# Patient Record
Sex: Female | Born: 1985
Health system: Southern US, Community
[De-identification: ages and names within clinical notes are randomized; demographics above are authoritative.]

## PROBLEM LIST (undated history)

## (undated) DIAGNOSIS — O1410 Severe pre-eclampsia, unspecified trimester: Secondary | ICD-10-CM

## (undated) DIAGNOSIS — N159 Renal tubulo-interstitial disease, unspecified: Secondary | ICD-10-CM

## (undated) DIAGNOSIS — O1420 HELLP syndrome (HELLP), unspecified trimester: Secondary | ICD-10-CM

## (undated) DIAGNOSIS — J45909 Unspecified asthma, uncomplicated: Secondary | ICD-10-CM

## (undated) DIAGNOSIS — T8859XA Other complications of anesthesia, initial encounter: Secondary | ICD-10-CM

## (undated) DIAGNOSIS — T7840XA Allergy, unspecified, initial encounter: Secondary | ICD-10-CM

## (undated) DIAGNOSIS — O904 Postpartum acute kidney failure: Secondary | ICD-10-CM

## (undated) DIAGNOSIS — F329 Major depressive disorder, single episode, unspecified: Secondary | ICD-10-CM

## (undated) DIAGNOSIS — O9049 Other postpartum acute kidney failure: Secondary | ICD-10-CM

## (undated) DIAGNOSIS — R112 Nausea with vomiting, unspecified: Secondary | ICD-10-CM

## (undated) DIAGNOSIS — F32A Depression, unspecified: Secondary | ICD-10-CM

## (undated) DIAGNOSIS — D649 Anemia, unspecified: Secondary | ICD-10-CM

## (undated) DIAGNOSIS — K7200 Acute and subacute hepatic failure without coma: Secondary | ICD-10-CM

## (undated) DIAGNOSIS — R569 Unspecified convulsions: Secondary | ICD-10-CM

## (undated) DIAGNOSIS — G40A09 Absence epileptic syndrome, not intractable, without status epilepticus: Secondary | ICD-10-CM

## (undated) DIAGNOSIS — Z9889 Other specified postprocedural states: Secondary | ICD-10-CM

## (undated) DIAGNOSIS — T4145XA Adverse effect of unspecified anesthetic, initial encounter: Secondary | ICD-10-CM

## (undated) HISTORY — DX: Anemia, unspecified: D64.9

## (undated) HISTORY — PX: TUBAL LIGATION: SHX77

## (undated) HISTORY — DX: Major depressive disorder, single episode, unspecified: F32.9

## (undated) HISTORY — DX: Depression, unspecified: F32.A

## (undated) HISTORY — DX: Allergy, unspecified, initial encounter: T78.40XA

## (undated) HISTORY — PX: BREAST ENHANCEMENT SURGERY: SHX7

---

## 2007-11-01 DIAGNOSIS — O1420 HELLP syndrome (HELLP), unspecified trimester: Secondary | ICD-10-CM

## 2007-11-01 HISTORY — DX: HELLP syndrome (HELLP), unspecified trimester: O14.20

## 2013-05-15 ENCOUNTER — Encounter (HOSPITAL_COMMUNITY): Payer: Self-pay

## 2013-05-15 ENCOUNTER — Emergency Department (HOSPITAL_COMMUNITY)
Admission: EM | Admit: 2013-05-15 | Discharge: 2013-05-16 | Disposition: A | Discharge status: Home / Self Care | Payer: BC Managed Care – PPO | Attending: Emergency Medicine | Admitting: Emergency Medicine

## 2013-05-15 DIAGNOSIS — Z87448 Personal history of other diseases of urinary system: Secondary | ICD-10-CM | POA: Insufficient documentation

## 2013-05-15 DIAGNOSIS — N12 Tubulo-interstitial nephritis, not specified as acute or chronic: Secondary | ICD-10-CM | POA: Insufficient documentation

## 2013-05-15 DIAGNOSIS — R51 Headache: Secondary | ICD-10-CM | POA: Insufficient documentation

## 2013-05-15 DIAGNOSIS — Z8719 Personal history of other diseases of the digestive system: Secondary | ICD-10-CM | POA: Insufficient documentation

## 2013-05-15 DIAGNOSIS — Z8669 Personal history of other diseases of the nervous system and sense organs: Secondary | ICD-10-CM | POA: Insufficient documentation

## 2013-05-15 DIAGNOSIS — N390 Urinary tract infection, site not specified: Secondary | ICD-10-CM | POA: Insufficient documentation

## 2013-05-15 DIAGNOSIS — M542 Cervicalgia: Secondary | ICD-10-CM | POA: Insufficient documentation

## 2013-05-15 DIAGNOSIS — Z87891 Personal history of nicotine dependence: Secondary | ICD-10-CM | POA: Insufficient documentation

## 2013-05-15 DIAGNOSIS — R109 Unspecified abdominal pain: Secondary | ICD-10-CM | POA: Insufficient documentation

## 2013-05-15 DIAGNOSIS — M549 Dorsalgia, unspecified: Secondary | ICD-10-CM | POA: Insufficient documentation

## 2013-05-15 HISTORY — DX: Acute and subacute hepatic failure without coma: K72.00

## 2013-05-15 HISTORY — DX: Absence epileptic syndrome, not intractable, without status epilepticus: G40.A09

## 2013-05-15 HISTORY — DX: Postpartum acute kidney failure: O90.4

## 2013-05-15 HISTORY — DX: Other postpartum acute kidney failure: O90.49

## 2013-05-15 HISTORY — DX: Renal tubulo-interstitial disease, unspecified: N15.9

## 2013-05-15 LAB — CBC WITH DIFFERENTIAL/PLATELET
Eosinophils Absolute: 0 10*3/uL (ref 0.0–0.7)
Eosinophils Relative: 0 % (ref 0–5)
HCT: 35.7 % — ABNORMAL LOW (ref 36.0–46.0)
Hemoglobin: 12.9 g/dL (ref 12.0–15.0)
Lymphocytes Relative: 7 % — ABNORMAL LOW (ref 12–46)
Lymphs Abs: 0.6 10*3/uL — ABNORMAL LOW (ref 0.7–4.0)
MCH: 31.4 pg (ref 26.0–34.0)
MCV: 86.9 fL (ref 78.0–100.0)
Monocytes Relative: 7 % (ref 3–12)
RBC: 4.11 MIL/uL (ref 3.87–5.11)
WBC: 9.5 10*3/uL (ref 4.0–10.5)

## 2013-05-15 LAB — URINALYSIS, ROUTINE W REFLEX MICROSCOPIC
Bilirubin Urine: NEGATIVE
Glucose, UA: NEGATIVE mg/dL
Hgb urine dipstick: NEGATIVE
Specific Gravity, Urine: 1.02 (ref 1.005–1.030)
pH: 7.5 (ref 5.0–8.0)

## 2013-05-15 LAB — URINE MICROSCOPIC-ADD ON

## 2013-05-15 LAB — BASIC METABOLIC PANEL
BUN: 6 mg/dL (ref 6–23)
CO2: 26 mEq/L (ref 19–32)
Calcium: 8.9 mg/dL (ref 8.4–10.5)
GFR calc non Af Amer: >90 mL/min (ref >90)
Glucose, Bld: 123 mg/dL — ABNORMAL HIGH (ref 70–99)
Sodium: 137 mEq/L (ref 135–145)

## 2013-05-15 MED ORDER — ONDANSETRON HCL 4 MG/2ML IJ SOLN
4.0000 mg | Freq: Once | INTRAMUSCULAR | Status: AC
  Administered 2013-05-15: 4 mg via INTRAVENOUS
  Filled 2013-05-15: qty 2

## 2013-05-15 MED ORDER — DEXTROSE 5 % IV SOLN
INTRAVENOUS | Status: AC
  Administered 2013-05-15: 23:00:00 via INTRAVENOUS
  Filled 2013-05-15: qty 10

## 2013-05-15 MED ORDER — SODIUM CHLORIDE 0.9 % IV BOLUS (SEPSIS)
1000.0000 mL | Freq: Once | INTRAVENOUS | Status: AC
  Administered 2013-05-15: 1000 mL via INTRAVENOUS

## 2013-05-15 MED ORDER — IBUPROFEN 800 MG PO TABS
800.0000 mg | ORAL_TABLET | Freq: Once | ORAL | Status: AC
  Administered 2013-05-15: 800 mg via ORAL
  Filled 2013-05-15: qty 1

## 2013-05-15 MED ORDER — HYDROMORPHONE HCL PF 1 MG/ML IJ SOLN
1.0000 mg | Freq: Once | INTRAMUSCULAR | Status: AC
  Administered 2013-05-15: 1 mg via INTRAVENOUS
  Filled 2013-05-15: qty 1

## 2013-05-15 MED ORDER — DEXTROSE 5 % IV SOLN
1.0000 g | Freq: Once | INTRAVENOUS | Status: AC
  Filled 2013-05-15 (×2): qty 10

## 2013-05-15 NOTE — ED Notes (Signed)
I have a kidney infection again, this is my second one in less than a year per pt. Pain in my left kidney spreading over to my right kidney. Pain in my spine. Chills and fever. Pain in my head per pt.

## 2013-05-15 NOTE — ED Provider Notes (Signed)
History    This chart was scribed for Alexis Lennert, MD by Donne Anon, ED Scribe. This patient was seen in room APA11/APA11 and the patient's care was started at 2133.  CSN: [id number] Arrival date & time 05/15/13  2107  First MD Initiated Contact with Patient 05/15/13 2133     Chief Complaint  Patient presents with  . Pyelonephritis  . Fever  . Headache    Patient is a 27 y.o. female presenting with fever and flank pain. The history is provided by the patient. No language interpreter was used.  Fever Max temp prior to arrival:  104 Temp source:  Oral Onset quality:  Gradual Progression:  Waxing and waning Chronicity:  New Relieved by:  Nothing Worsened by:  Nothing tried Ineffective treatments:  Acetaminophen and ibuprofen Associated symptoms: headaches   Associated symptoms: no chest pain, no congestion, no cough, no diarrhea, no dysuria and no rash   Flank Pain This is a new problem. The current episode started more than 2 days ago. The problem occurs constantly. The problem has been gradually worsening. Associated symptoms include headaches. Pertinent negatives include no chest pain and no abdominal pain. Nothing aggravates the symptoms. Nothing relieves the symptoms. She has tried acetaminophen for the symptoms. The treatment provided no relief.   HPI Comments: Alexis White is a 27 y.o. female who presents to the Emergency Department complaining of gradual onset, gradually worsening left sided kidney pain that began a few days ago and is beginning to spread to her right kidney. She reports associated fever (max PTA 104), chills, photophobia, neck pain and back pain. She has tried ibuprofen and Advil with little relief. This feels similar to her previous kidney infections. She denies a hx of bladder infections, but states this will be her second kidney infection this year.   Past Medical History  Diagnosis Date  . Kidney infection   . Acute liver failure   . Acute  kidney failure following labor and delivery   . Absence seizure    Past Surgical History  Procedure Laterality Date  . Breast enhancement surgery    . Cesarean section     No family history on file. History  Substance Use Topics  . Smoking status: Former Games developer  . Smokeless tobacco: Current User  . Alcohol Use: Yes     Comment: occasional   OB History   Grav Para Term Preterm Abortions TAB SAB Ect Mult Living                 Review of Systems  Constitutional: Positive for fever. Negative for appetite change and fatigue.  HENT: Positive for neck pain. Negative for congestion, sinus pressure and ear discharge.   Eyes: Negative for discharge.  Respiratory: Negative for cough.   Cardiovascular: Negative for chest pain.  Gastrointestinal: Negative for abdominal pain and diarrhea.  Genitourinary: Positive for flank pain. Negative for dysuria, frequency and hematuria.  Musculoskeletal: Positive for back pain.  Skin: Negative for rash.  Neurological: Positive for headaches. Negative for seizures.  Psychiatric/Behavioral: Negative for hallucinations.    Allergies  Ciprofloxacin hcl  Home Medications   Current Outpatient Rx  Name  Route  Sig  Dispense  Refill  . medroxyPROGESTERone (DEPO-PROVERA) 150 MG/ML injection   Intramuscular   Inject 150 mg into the muscle every 3 (three) months.         . sulfamethoxazole-trimethoprim (BACTRIM DS) 800-160 MG per tablet   Oral   Take 1 tablet by  mouth 2 (two) times daily. Started on 05/15/2013          BP 107/64  Pulse 128  Temp(Src) 102.9 F (39.4 C) (Oral)  Resp 24  Ht 5\' 3"  (1.6 m)  Wt 125 lb (56.7 kg)  BMI 22.15 kg/m2  SpO2 100% Physical Exam  Constitutional: She is oriented to person, place, and time. She appears well-developed.  HENT:  Head: Normocephalic.  Eyes: Conjunctivae and EOM are normal. No scleral icterus.  Neck: Neck supple. No thyromegaly present.  Cardiovascular: Normal rate and regular rhythm.  Exam  reveals no gallop and no friction rub.   No murmur heard. Pulmonary/Chest: No stridor. She has no wheezes. She has no rales. She exhibits no tenderness.  Abdominal: She exhibits no distension. There is no tenderness. There is no rebound.  Musculoskeletal: Normal range of motion. She exhibits no edema.  Left flank tenderness. Mild posterior neck tenderness.  Lymphadenopathy:    She has no cervical adenopathy.  Neurological: She is oriented to person, place, and time. Coordination normal.  Skin: No rash noted. No erythema.  Psychiatric: She has a normal mood and affect. Her behavior is normal.    ED Course  Procedures (including critical care time) DIAGNOSTIC STUDIES: Oxygen Saturation is 100% on RA, normal by my interpretation.    COORDINATION OF CARE: 9:41 PM Discussed treatment plan which includes ibuprofen and labs with pt at bedside and pt agreed to plan.   Results for orders placed during the hospital encounter of 05/15/13  URINALYSIS, ROUTINE W REFLEX MICROSCOPIC      Result Value Range   Color, Urine YELLOW  YELLOW   APPearance CLEAR  CLEAR   Specific Gravity, Urine 1.020  1.005 - 1.030   pH 7.5  5.0 - 8.0   Glucose, UA NEGATIVE  NEGATIVE mg/dL   Hgb urine dipstick NEGATIVE  NEGATIVE   Bilirubin Urine NEGATIVE  NEGATIVE   Ketones, ur NEGATIVE  NEGATIVE mg/dL   Protein, ur 30 (*) NEGATIVE mg/dL   Urobilinogen, UA 1.0  0.0 - 1.0 mg/dL   Nitrite NEGATIVE  NEGATIVE   Leukocytes, UA NEGATIVE  NEGATIVE  CBC WITH DIFFERENTIAL      Result Value Range   WBC 9.5  4.0 - 10.5 K/uL   RBC 4.11  3.87 - 5.11 MIL/uL   Hemoglobin 12.9  12.0 - 15.0 g/dL   HCT 16.1 (*) 09.6 - 04.5 %   MCV 86.9  78.0 - 100.0 fL   MCH 31.4  26.0 - 34.0 pg   MCHC 36.1 (*) 30.0 - 36.0 g/dL   RDW 40.9  81.1 - 91.4 %   Platelets 168  150 - 400 K/uL   Neutrophils Relative % 87 (*) 43 - 77 %   Neutro Abs 8.2 (*) 1.7 - 7.7 K/uL   Lymphocytes Relative 7 (*) 12 - 46 %   Lymphs Abs 0.6 (*) 0.7 - 4.0 K/uL    Monocytes Relative 7  3 - 12 %   Monocytes Absolute 0.6  0.1 - 1.0 K/uL   Eosinophils Relative 0  0 - 5 %   Eosinophils Absolute 0.0  0.0 - 0.7 K/uL   Basophils Relative 0  0 - 1 %   Basophils Absolute 0.0  0.0 - 0.1 K/uL  BASIC METABOLIC PANEL      Result Value Range   Sodium 137  135 - 145 mEq/L   Potassium 3.3 (*) 3.5 - 5.1 mEq/L   Chloride 101  96 - 112 mEq/L  CO2 26  19 - 32 mEq/L   Glucose, Bld 123 (*) 70 - 99 mg/dL   BUN 6  6 - 23 mg/dL   Creatinine, Ser 4.09  0.50 - 1.10 mg/dL   Calcium 8.9  8.4 - 81.1 mg/dL   GFR calc non Af Amer >90  >90 mL/min   GFR calc Af Amer >90  >90 mL/min  URINE MICROSCOPIC-ADD ON      Result Value Range   Squamous Epithelial / LPF RARE  RARE   WBC, UA 21-50  <3 WBC/hpf   Bacteria, UA FEW (*) RARE   No results found.    No diagnosis found.  MDM    The chart was scribed for me under my direct supervision.  I personally performed the history, physical, and medical decision making and all procedures in the evaluation of this patient.Alexis Lennert, MD 05/15/13 2312

## 2013-05-15 NOTE — ED Notes (Addendum)
IV fluids not running at this time. Placed IV fluids on pump to run over 1 hour. Patient immediately started complaining of a warm sensation and a funny feeling in her head. Spouse states that it does not look like the fluids had ran any and was asking if there was a chance that the dilaudid had not went completely through her IV. Patient stated feeling was subsiding and ask me to run fluids at a slower rate. Cut rate back to half at this time.

## 2013-05-16 NOTE — ED Notes (Signed)
Around 900 ML of NS left in bag at this time. Changed rate to bolus over 1 hour on pump. Patient and spouse state that they want to stay until fluid has finished running in.

## 2013-05-17 LAB — URINE CULTURE: Colony Count: NO GROWTH

## 2015-05-13 ENCOUNTER — Encounter: Payer: Self-pay | Admitting: *Deleted

## 2015-05-14 ENCOUNTER — Encounter: Payer: Self-pay | Admitting: Advanced Practice Midwife

## 2015-05-14 ENCOUNTER — Ambulatory Visit (INDEPENDENT_AMBULATORY_CARE_PROVIDER_SITE_OTHER): Payer: BLUE CROSS/BLUE SHIELD | Admitting: Advanced Practice Midwife

## 2015-05-14 VITALS — BP 92/60 | HR 84 | Ht 63.5 in | Wt 131.0 lb

## 2015-05-14 DIAGNOSIS — Z3042 Encounter for surveillance of injectable contraceptive: Secondary | ICD-10-CM

## 2015-05-14 DIAGNOSIS — Z3202 Encounter for pregnancy test, result negative: Secondary | ICD-10-CM

## 2015-05-14 LAB — POCT URINE PREGNANCY: PREG TEST UR: NEGATIVE

## 2015-05-14 MED ORDER — MEDROXYPROGESTERONE ACETATE 150 MG/ML IM SUSP
150.0000 mg | Freq: Once | INTRAMUSCULAR | Status: AC
  Administered 2015-05-14: 150 mg via INTRAMUSCULAR

## 2015-05-14 NOTE — Progress Notes (Signed)
   [facility] Visit  Patient name: Alexis White MRN [id number]  Date of birth: 1986-05-25  CC & HPI:  Alexis White is a 29 y.o. Caucasian female presenting today for maintenance of depo.  She has been on it for a few years, aware that it could decrease bone, and takes a calcuim supplement.  Had a B salpingectomy, but uses depo for ovarian cyst suppression  Is amenorrheic and happy.  Tired of driving to [place] for depo.  Had normal pap 9/14.   Pertinent History Reviewed:  Medical & Surgical Hx:   Past Medical History  Diagnosis Date  . Kidney infection   . Acute liver failure   . Acute kidney failure following labor and delivery   . Absence seizure    Past Surgical History  Procedure Laterality Date  . Breast enhancement surgery    . Cesarean section    . Tubal ligation     Family History  Problem Relation Age of Onset  . Heart disease Father   . Cancer Maternal Aunt     skin  . Cancer Paternal Grandfather     bladder  . Diabetes Other   . Heart disease Other   . Heart disease Maternal Grandmother   . Congestive Heart Failure Maternal Grandmother   . Cancer Maternal Aunt     skin    Current outpatient prescriptions:  .  medroxyPROGESTERone (DEPO-PROVERA) 150 MG/ML injection, Inject 150 mg into the muscle every 3 (three) months., Disp: , Rfl:  Social History: Reviewed -  reports that she has been smoking E-cigarettes.  She has never used smokeless tobacco.  Review of Systems:   Constitutional: Negative for fever and chills Eyes: Negative for visual disturbances Respiratory: Negative for shortness of breath, dyspnea Cardiovascular: Negative for chest pain or palpitations  Gastrointestinal: Negative for vomiting, diarrhea and constipation; no abdominal pain Genitourinary: Negative for dysuria and urgency, vaginal irritation or itching Musculoskeletal: Negative for back pain, joint pain, myalgias  Neurological: Negative for dizziness and  headaches    Objective Findings:  Vitals: BP 92/60 mmHg  Pulse 84  Ht 5' 3.5" (1.613 m)  Wt 131 lb (59.421 kg)  BMI 22.84 kg/m2  Physical Examination: General appearance - alert, well appearing, and in no distress Mental status - alert, oriented to person, place, and time Chest - clear to auscultation, no wheezes, rales or rhonchi, symmetric air entry Heart - normal rate and regular rhythm Abdomen - soft, nontender, nondistended, no masses or organomegaly bowel sounds normal Musculoskeletal - no muscular tenderness noted   Results for orders placed or performed in visit on 05/14/15 (from the past 24 hour(s))  POCT urine pregnancy   Collection Time: 05/14/15 11:53 AM  Result Value Ref Range   Preg Test, Ur Negative Negative         Assessment & Plan:  A:  Contraception management P:  Depo today   F/U 12 weeks for depo   Pap 9/17 CRESENZO-DISHMAN,Colson Barco CNM 05/14/2015 11:59 AM

## 2015-08-04 ENCOUNTER — Encounter: Payer: Self-pay | Admitting: *Deleted

## 2015-08-04 ENCOUNTER — Ambulatory Visit (INDEPENDENT_AMBULATORY_CARE_PROVIDER_SITE_OTHER): Payer: BLUE CROSS/BLUE SHIELD | Admitting: *Deleted

## 2015-08-04 DIAGNOSIS — Z3202 Encounter for pregnancy test, result negative: Secondary | ICD-10-CM

## 2015-08-04 DIAGNOSIS — Z3042 Encounter for surveillance of injectable contraceptive: Secondary | ICD-10-CM | POA: Diagnosis not present

## 2015-08-04 LAB — POCT URINE PREGNANCY: PREG TEST UR: NEGATIVE

## 2015-08-04 MED ORDER — MEDROXYPROGESTERONE ACETATE 150 MG/ML IM SUSP
150.0000 mg | Freq: Once | INTRAMUSCULAR | Status: AC
  Administered 2015-08-04: 150 mg via INTRAMUSCULAR

## 2015-08-04 NOTE — Progress Notes (Signed)
Pt here for Depo. Reports no problems at this time. Return in 12 weeks for next shot. JSY 

## 2015-10-20 ENCOUNTER — Ambulatory Visit (INDEPENDENT_AMBULATORY_CARE_PROVIDER_SITE_OTHER): Payer: BLUE CROSS/BLUE SHIELD | Admitting: *Deleted

## 2015-10-20 ENCOUNTER — Encounter: Payer: Self-pay | Admitting: *Deleted

## 2015-10-20 DIAGNOSIS — Z3042 Encounter for surveillance of injectable contraceptive: Secondary | ICD-10-CM | POA: Diagnosis not present

## 2015-10-20 DIAGNOSIS — Z3202 Encounter for pregnancy test, result negative: Secondary | ICD-10-CM

## 2015-10-20 DIAGNOSIS — Z3200 Encounter for pregnancy test, result unknown: Secondary | ICD-10-CM

## 2015-10-20 LAB — POCT URINE PREGNANCY: PREG TEST UR: NEGATIVE

## 2015-10-20 MED ORDER — MEDROXYPROGESTERONE ACETATE 150 MG/ML IM SUSP
150.0000 mg | Freq: Once | INTRAMUSCULAR | Status: AC
  Administered 2015-10-20: 150 mg via INTRAMUSCULAR

## 2015-10-20 NOTE — Progress Notes (Signed)
Patient ID: Alexis OrganDevin L White, female   DOB: 09/15/1986, 29 y.o.   MRN: [id number] Pt here today for DEPO injection. Pt denies any problems or concerns at this time. UPT today is negative.

## 2015-12-25 ENCOUNTER — Telehealth: Payer: Self-pay | Admitting: Adult Health

## 2015-12-25 NOTE — Telephone Encounter (Signed)
I called Depo into [facility] in [place]. Pt aware. JSY

## 2016-01-05 ENCOUNTER — Encounter: Payer: Self-pay | Admitting: *Deleted

## 2016-01-05 ENCOUNTER — Ambulatory Visit (INDEPENDENT_AMBULATORY_CARE_PROVIDER_SITE_OTHER): Payer: 59 | Admitting: *Deleted

## 2016-01-05 DIAGNOSIS — Z3042 Encounter for surveillance of injectable contraceptive: Secondary | ICD-10-CM | POA: Diagnosis not present

## 2016-01-05 DIAGNOSIS — Z3202 Encounter for pregnancy test, result negative: Secondary | ICD-10-CM

## 2016-01-05 LAB — POCT URINE PREGNANCY: Preg Test, Ur: NEGATIVE

## 2016-01-05 MED ORDER — MEDROXYPROGESTERONE ACETATE 150 MG/ML IM SUSP
150.0000 mg | Freq: Once | INTRAMUSCULAR | Status: AC
  Administered 2016-01-05: 150 mg via INTRAMUSCULAR

## 2016-01-05 NOTE — Progress Notes (Signed)
Pt here for Depo. Pt tolerated shot well. Return in 12 weeks for next shot. JSY 

## 2016-03-22 ENCOUNTER — Ambulatory Visit: Payer: 59

## 2016-03-24 ENCOUNTER — Ambulatory Visit (INDEPENDENT_AMBULATORY_CARE_PROVIDER_SITE_OTHER): Payer: 59 | Admitting: *Deleted

## 2016-03-24 DIAGNOSIS — Z3042 Encounter for surveillance of injectable contraceptive: Secondary | ICD-10-CM | POA: Diagnosis not present

## 2016-03-24 DIAGNOSIS — Z3202 Encounter for pregnancy test, result negative: Secondary | ICD-10-CM | POA: Diagnosis not present

## 2016-03-24 LAB — POCT URINE PREGNANCY: Preg Test, Ur: NEGATIVE

## 2016-03-24 MED ORDER — MEDROXYPROGESTERONE ACETATE 150 MG/ML IM SUSP
150.0000 mg | Freq: Once | INTRAMUSCULAR | Status: AC
  Administered 2016-03-24: 150 mg via INTRAMUSCULAR

## 2016-03-24 NOTE — Progress Notes (Signed)
Pt here for Depo Provera injection, pt reports no problems at this time and tolerated injection well.  Pt advised to RTO in 12 wks for next injection.

## 2016-06-14 ENCOUNTER — Ambulatory Visit: Payer: 59 | Admitting: Obstetrics & Gynecology

## 2016-06-15 ENCOUNTER — Encounter: Payer: Self-pay | Admitting: Obstetrics & Gynecology

## 2016-06-15 ENCOUNTER — Other Ambulatory Visit (HOSPITAL_COMMUNITY)
Admission: RE | Admit: 2016-06-15 | Discharge: 2016-06-15 | Disposition: A | Discharge status: Home / Self Care | Payer: 59 | Source: Ambulatory Visit | Attending: Obstetrics & Gynecology | Admitting: Obstetrics & Gynecology

## 2016-06-15 ENCOUNTER — Ambulatory Visit (INDEPENDENT_AMBULATORY_CARE_PROVIDER_SITE_OTHER): Payer: 59 | Admitting: Obstetrics & Gynecology

## 2016-06-15 VITALS — BP 100/60 | HR 92 | Ht 63.4 in | Wt 140.0 lb

## 2016-06-15 DIAGNOSIS — Z3202 Encounter for pregnancy test, result negative: Secondary | ICD-10-CM | POA: Diagnosis not present

## 2016-06-15 DIAGNOSIS — Z01419 Encounter for gynecological examination (general) (routine) without abnormal findings: Secondary | ICD-10-CM | POA: Insufficient documentation

## 2016-06-15 DIAGNOSIS — N941 Unspecified dyspareunia: Secondary | ICD-10-CM | POA: Diagnosis not present

## 2016-06-15 LAB — POCT URINE PREGNANCY: PREG TEST UR: NEGATIVE

## 2016-06-15 NOTE — Progress Notes (Signed)
Subjective:     Alexis White is a 30 y.o. female here for a routine exam.  No LMP recorded. Patient has had an injection. [id number] Birth Control Method:  Tubal ligation salpingectomy Menstrual Calendar(currently): amneorrheic on depo for cycle control and fibrocystic breast changes  Current complaints: new onset dyspareunia.   Current acute medical issues:  none   Recent Gynecologic History No LMP recorded. Patient has had an injection. Last Pap: 2014,  normal Last mammogram: ,    Past Medical History:  Diagnosis Date  . Absence seizure (HCC)   . Acute kidney failure following labor and delivery   . Acute liver failure   . Kidney infection     Past Surgical History:  Procedure Laterality Date  . BREAST ENHANCEMENT SURGERY    . CESAREAN SECTION    . TUBAL LIGATION      OB History    Gravida Para Term Preterm AB Living   4       1 3    SAB TAB Ectopic Multiple Live Births   1       3      Social History   Social History  . Marital status: Married    Spouse name: N/A  . Number of children: N/A  . Years of education: N/A   Social History Main Topics  . Smoking status: Current Every Day Smoker    Types: E-cigarettes  . Smokeless tobacco: Never Used  . Alcohol use Yes     Comment: occasional wine cooler  . Drug use: No  . Sexual activity: Yes    Birth control/ protection: Injection, Surgical     Comment: tubal   Other Topics Concern  . None   Social History Narrative  . None    Family History  Problem Relation Age of Onset  . Heart disease Father   . Cancer Maternal Aunt     skin  . Breast cancer Maternal Aunt   . Cancer Paternal Grandfather     bladder  . Heart disease Maternal Grandmother   . Congestive Heart Failure Maternal Grandmother   . Cancer Maternal Aunt     skin  . Breast cancer Maternal Aunt   . Nevi Mother   . Diabetes Other   . Heart disease Other      Current Outpatient Prescriptions:  .  medroxyPROGESTERone (DEPO-PROVERA)  150 MG/ML injection, Inject 150 mg into the muscle every 3 (three) months., Disp: , Rfl:   Review of Systems  Review of Systems  Constitutional: Negative for fever, chills, weight loss, malaise/fatigue and diaphoresis.  HENT: Negative for hearing loss, ear pain, nosebleeds, congestion, sore throat, neck pain, tinnitus and ear discharge.   Eyes: Negative for blurred vision, double vision, photophobia, pain, discharge and redness.  Respiratory: Negative for cough, hemoptysis, sputum production, shortness of breath, wheezing and stridor.   Cardiovascular: Negative for chest pain, palpitations, orthopnea, claudication, leg swelling and PND.  Gastrointestinal: negative for abdominal pain. Negative for heartburn, nausea, vomiting, diarrhea, constipation, blood in stool and melena.  Genitourinary: Negative for dysuria, urgency, frequency, hematuria and flank pain.  Musculoskeletal: Negative for myalgias, back pain, joint pain and falls.  Skin: Negative for itching and rash.  Neurological: Negative for dizziness, tingling, tremors, sensory change, speech change, focal weakness, seizures, loss of consciousness, weakness and headaches.  Endo/Heme/Allergies: Negative for environmental allergies and polydipsia. Does not bruise/bleed easily.  Psychiatric/Behavioral: Negative for depression, suicidal ideas, hallucinations, memory loss and substance abuse. The patient is not nervous/anxious  and does not have insomnia.        Objective:  Blood pressure 100/60, pulse 92, height 5' 3.4" (1.61 m), weight 140 lb (63.5 kg).   Physical Exam  Vitals reviewed. Constitutional: She is oriented to person, place, and time. She appears well-developed and well-nourished.  HENT:  Head: Normocephalic and atraumatic.        Right Ear: External ear normal.  Left Ear: External ear normal.  Nose: Nose normal.  Mouth/Throat: Oropharynx is clear and moist.  Eyes: Conjunctivae and EOM are normal. Pupils are equal, round,  and reactive to light. Right eye exhibits no discharge. Left eye exhibits no discharge. No scleral icterus.  Neck: Normal range of motion. Neck supple. No tracheal deviation present. No thyromegaly present.  Cardiovascular: Normal rate, regular rhythm, normal heart sounds and intact distal pulses.  Exam reveals no gallop and no friction rub.   No murmur heard. Respiratory: Effort normal and breath sounds normal. No respiratory distress. She has no wheezes. She has no rales. She exhibits no tenderness.  GI: Soft. Bowel sounds are normal. She exhibits no distension and no mass. There is no tenderness. There is no rebound and no guarding.  Genitourinary:  Breasts no masses skin changes or nipple changes bilaterally      Vulva is normal without lesions Vagina is pink moist without discharge Cervix normal in appearance and pap is done Uterus is normal size shape and contour Adnexa is negative with normal sized ovaries   Musculoskeletal: Normal range of motion. She exhibits no edema and no tenderness.  Neurological: She is alert and oriented to person, place, and time. She has normal reflexes. She displays normal reflexes. No cranial nerve deficit. She exhibits normal muscle tone. Coordination normal.  Skin: Skin is warm and dry. No rash noted. No erythema. No pallor.  Psychiatric: She has a normal mood and affect. Her behavior is normal. Judgment and thought content normal.       Medications Ordered at today's visit: No orders of the defined types were placed in this encounter.   Other orders placed at today's visit: Orders Placed This Encounter  Procedures  . POCT urine pregnancy      Assessment:    Healthy female exam.    Plan:    Contraception: tubal ligation. Follow up in: 1 month.     Return in about 1 month (around 07/16/2016) for Follow up, with Dr Despina HiddenEure.

## 2016-06-17 LAB — CYTOLOGY - PAP

## 2016-08-04 ENCOUNTER — Encounter: Payer: Self-pay | Admitting: Obstetrics & Gynecology

## 2016-08-04 ENCOUNTER — Ambulatory Visit (INDEPENDENT_AMBULATORY_CARE_PROVIDER_SITE_OTHER): Payer: 59 | Admitting: Obstetrics & Gynecology

## 2016-08-04 VITALS — BP 100/60 | HR 80 | Ht 63.4 in | Wt 152.0 lb

## 2016-08-04 DIAGNOSIS — N941 Unspecified dyspareunia: Secondary | ICD-10-CM | POA: Diagnosis not present

## 2016-08-04 DIAGNOSIS — N946 Dysmenorrhea, unspecified: Secondary | ICD-10-CM

## 2016-08-04 DIAGNOSIS — N920 Excessive and frequent menstruation with regular cycle: Secondary | ICD-10-CM

## 2016-08-04 NOTE — Progress Notes (Signed)
Preoperative History and Physical  Alexis White is a 30 y.o. [phone] with No LMP recorded. Patient has had an injection. admitted for a abdominal hysterectomy, removal of tubes.  Pt has a lifelong history of menorrhagia and dysmenorrhea which essentially debilitates patient for several days at the time each month.  She is managed with depo provera even though she has had a tubal ligation, however she is started to be concerned about long term side effects of depo provera such as osteoporosis and mood disruption and she cannot really go about regular life otherwise. Additionally she has developed dyspareunia, bump, every time she ahs intercourse.  Exam reveals that to be an adherent uterus to her abdomian wall from her previous Caesarean sections.  She does not have lateral pain with menses or intercourse  PMH:    Past Medical History:  Diagnosis Date  . Absence seizure (HCC)   . Acute kidney failure following labor and delivery   . Acute liver failure   . Kidney infection     PSH:     Past Surgical History:  Procedure Laterality Date  . BREAST ENHANCEMENT SURGERY    . CESAREAN SECTION    . TUBAL LIGATION      POb/GynH:      OB History    Gravida Para Term Preterm AB Living   4       1 3    SAB TAB Ectopic Multiple Live Births   1       3      SH:   Social History  Substance Use Topics  . Smoking status: Current Every Day Smoker    Types: E-cigarettes  . Smokeless tobacco: Never Used  . Alcohol use Yes     Comment: occasional wine cooler    FH:    Family History  Problem Relation Age of Onset  . Heart disease Father   . Cancer Maternal Aunt     skin  . Breast cancer Maternal Aunt   . Cancer Paternal Grandfather     bladder  . Heart disease Maternal Grandmother   . Congestive Heart Failure Maternal Grandmother   . Cancer Maternal Aunt     skin  . Breast cancer Maternal Aunt   . Nevi Mother   . Diabetes Other   . Heart disease Other      Allergies:   Allergies  Allergen Reactions  . Ciprofloxacin Hcl   . Coppertone Kids Spf15 [Albolene] Hives  . Pollen Extract Hives    Medications:       Current Outpatient Prescriptions:  .  medroxyPROGESTERone (DEPO-PROVERA) 150 MG/ML injection, Inject 150 mg into the muscle every 3 (three) months., Disp: , Rfl:   Review of Systems:   Review of Systems  Constitutional: Negative for fever, chills, weight loss, malaise/fatigue and diaphoresis.  HENT: Negative for hearing loss, ear pain, nosebleeds, congestion, sore throat, neck pain, tinnitus and ear discharge.   Eyes: Negative for blurred vision, double vision, photophobia, pain, discharge and redness.  Respiratory: Negative for cough, hemoptysis, sputum production, shortness of breath, wheezing and stridor.   Cardiovascular: Negative for chest pain, palpitations, orthopnea, claudication, leg swelling and PND.  Gastrointestinal: Positive for abdominal pain. Negative for heartburn, nausea, vomiting, diarrhea, constipation, blood in stool and melena.  Genitourinary: Negative for dysuria, urgency, frequency, hematuria and flank pain.  Musculoskeletal: Negative for myalgias, back pain, joint pain and falls.  Skin: Negative for itching and rash.  Neurological: Negative for dizziness, tingling, tremors, sensory change, speech  change, focal weakness, seizures, loss of consciousness, weakness and headaches.  Endo/Heme/Allergies: Negative for environmental allergies and polydipsia. Does not bruise/bleed easily.  Psychiatric/Behavioral: Negative for depression, suicidal ideas, hallucinations, memory loss and substance abuse. The patient is not nervous/anxious and does not have insomnia.      PHYSICAL EXAM:  Blood pressure 100/60, pulse 80, height 5' 3.4" (1.61 m), weight 152 lb (68.9 kg).    Vitals reviewed. Constitutional: She is oriented to person, place, and time. She appears well-developed and well-nourished.  HENT:  Head: Normocephalic and  atraumatic.  Right Ear: External ear normal.  Left Ear: External ear normal.  Nose: Nose normal.  Mouth/Throat: Oropharynx is clear and moist.  Eyes: Conjunctivae and EOM are normal. Pupils are equal, round, and reactive to light. Right eye exhibits no discharge. Left eye exhibits no discharge. No scleral icterus.  Neck: Normal range of motion. Neck supple. No tracheal deviation present. No thyromegaly present.  Cardiovascular: Normal rate, regular rhythm, normal heart sounds and intact distal pulses.  Exam reveals no gallop and no friction rub.   No murmur heard. Respiratory: Effort normal and breath sounds normal. No respiratory distress. She has no wheezes. She has no rales. She exhibits no tenderness.  GI: Soft. Bowel sounds are normal. She exhibits no distension and no mass. There is tenderness. There is no rebound and no guarding.  Genitourinary:       Vulva is normal without lesions Vagina is pink moist without discharge Cervix normal in appearance and pap is normal, tender to movement Uterus is tender adherent to the anterior abdominal wall by adhesions from her 2 c sections Adnexa is negative with normal sized ovaries by sonogram  Musculoskeletal: Normal range of motion. She exhibits no edema and no tenderness.  Neurological: She is alert and oriented to person, place, and time. She has normal reflexes. She displays normal reflexes. No cranial nerve deficit. She exhibits normal muscle tone. Coordination normal.  Skin: Skin is warm and dry. No rash noted. No erythema. No pallor.  Psychiatric: She has a normal mood and affect. Her behavior is normal. Judgment and thought content normal.    Labs: No results found for this or any previous visit (from the past 336 hour(s)).  EKG: No orders found for this or any previous visit.  Imaging Studies: No results found.    Assessment: Dyspareunia, female  Dysmenorrhea  Menorrhagia with regular cycle     Plan: Abdominal  hysterectomy remove any remaining tubal tissue, plan to preserve ovaries unless otherwise indicated intra operatively  Coley Kulikowski H 08/04/2016 10:52 AM

## 2016-08-23 NOTE — Patient Instructions (Signed)
Alexis White  08/23/2016     @PREFPERIOPPHARMACY @   Your procedure is scheduled on  08/31/2016   Report to [facility] at  615 A.M.  Call this number if you have problems the morning of surgery:  [phone]   Remember:  Do not eat food or drink liquids after midnight.  Take these medicines the morning of surgery with A SIP OF WATER :none. Take your inhaler before you come.   Do not wear jewelry, make-up or nail polish.  Do not wear lotions, powders, or perfumes, or deoderant.  Do not shave 48 hours prior to surgery.  Men may shave face and neck.  Do not bring valuables to the hospital.  [facility] is not responsible for any belongings or valuables.  Contacts, dentures or bridgework may not be worn into surgery.  Leave your suitcase in the car.  After surgery it may be brought to your room.  For patients admitted to the hospital, discharge time will be determined by your treatment team.  Patients discharged the day of surgery will not be allowed to drive home.   Name and phone number of your driver:   family Special instructions:  none  Please read over the following fact sheets that you were given. Anesthesia Post-op Instructions and Care and Recovery After Surgery       Abdominal Hysterectomy Abdominal hysterectomy is a surgery to remove your womb (uterus). Your womb is the part of your body that contains a growing baby. The surgery may be done for many reasons. These may include cancer, growths (tumors), long-term pain, or bleeding. You may also need other reproductive parts removed during this surgery. This will depend on why you need to have the surgery. BEFORE THE PROCEDURE  Talk to your doctor about the changes to your body. These changes may be physical and emotional.  You may need to have blood work done. You may also need X-rays done.  Quit smoking if you smoke. Ask your doctor for help.  Stop taking medicines that thin your blood as  told by your doctor.  Your doctor may have you take other medicines. Take all medicines as told by your doctor.  Do not eat or drink anything for 6-8 hours before surgery.  Take your normal medicines with a small sip of water.  Shower or take a bath the night or morning before surgery. PROCEDURE  This surgery is done in the hospital.  You are given a medicine that makes you go to sleep (general anesthetic).  The doctor will make a cut (incision) through the skin in your lower belly.  The cut may be about 5-7 inches long. It may go side-to-side or up-and-down.  The doctor will move the body tissue that covers your womb. The doctor will carefully remove your womb. The doctor may remove any other reproductive parts that need to be removed.  The doctor will use clamps or stitches (sutures) to control bleeding.  The doctor will close your cut with stitches or metal clips. AFTER THE PROCEDURE  You will have pain right after the procedure.  You will be given pain medicine in the recovery room.  You will be taken to your hospital room after the medicines that made you go to sleep wear off.  You will be told how to take care of yourself at home.   This information is not intended to replace advice given to you  by your health care provider. Make sure you discuss any questions you have with your health care provider.   Document Released: 10/22/2013 Document Reviewed: 10/22/2013 Elsevier Interactive Patient Education 2016 [employer]  Abdominal Hysterectomy, Care After These instructions give you information on caring for yourself after your procedure. Your doctor may also give you more specific instructions. Call your doctor if you have any problems or questions after your procedure.  HOME CARE It takes 4-6 weeks to recover from this surgery. Follow all of your doctor's instructions.   Only take medicines as told by your doctor.  Change your bandage as told by your  doctor.  Return to your doctor to have your stitches taken out.  Take showers for 2-3 weeks. Ask your doctor when it is okay to shower.  Do not douche, use tampons, or have sex (intercourse) for at least 6 weeks or as told.  Follow your doctor's advice about exercise, lifting objects, driving, and general activities.  Get plenty of rest and sleep.  Do not lift anything heavier than a gallon of milk (about 10 pounds [4.5 kilograms]) for the first month after surgery.  Get back to your normal diet as told by your doctor.  Do not drink alcohol until your doctor says it is okay.  Take a medicine to help you poop (laxative) as told by your doctor.  Eating foods high in fiber may help you poop. Eat a lot of raw fruits and vegetables, whole grains, and beans.  Drink enough fluids to keep your pee (urine) clear or pale yellow.  Have someone help you at home for 1-2 weeks after your surgery.  Keep follow-up doctor visits as told. GET HELP IF:  You have chills or fever.  You have puffiness, redness, or pain in area of the cut (incision).  You have yellowish-white fluid (pus) coming from the cut.  You have a bad smell coming from the cut or bandage.  Your cut pulls apart.  You feel dizzy or light-headed.  You have pain or bleeding when you pee.  You keep having watery poop (diarrhea).  You keep feeling sick to your stomach (nauseous) or keep throwing up (vomiting).  You have fluid (discharge) coming from your vagina.  You have a rash.  You have a reaction to your medicine.  You need stronger pain medicine. GET HELP RIGHT AWAY IF:   You have a fever and your symptoms suddenly get worse.  You have bad belly (abdominal) pain.  You have chest pain.  You are short of breath.  You pass out (faint).  You have pain, puffiness, or redness of your leg.  You bleed a lot from your vagina and notice clumps of tissue (clots). MAKE SURE YOU:   Understand these  instructions.  Will watch your condition.  Will get help right away if you are not doing well or get worse.   This information is not intended to replace advice given to you by your health care provider. Make sure you discuss any questions you have with your health care provider.   Document Released: 07/26/2008 Document Revised: 10/22/2013 Document Reviewed: 08/09/2013 Elsevier Interactive Patient Education 2016 [employer] General Anesthesia, Adult General anesthesia is a sleep-like state of non-feeling produced by medicines (anesthetics). General anesthesia prevents you from being alert and feeling pain during a medical procedure. Your caregiver may recommend general anesthesia if your procedure:  Is long.  Is painful or uncomfortable.  Would be frightening to see or hear.  Requires  you to be still.  Affects your breathing.  Causes significant blood loss. LET YOUR CAREGIVER KNOW ABOUT:  Allergies to food or medicine.  Medicines taken, including vitamins, herbs, eyedrops, over-the-counter medicines, and creams.  Use of steroids (by mouth or creams).  Previous problems with anesthetics or numbing medicines, including problems experienced by relatives.  History of bleeding problems or blood clots.  Previous surgeries and types of anesthetics received.  Possibility of pregnancy, if this applies.  Use of cigarettes, alcohol, or illegal drugs.  Any health condition(s), especially diabetes, sleep apnea, and high blood pressure. RISKS AND COMPLICATIONS General anesthesia rarely causes complications. However, if complications do occur, they can be life threatening. Complications include:  A lung infection.  A stroke.  A heart attack.  Waking up during the procedure. When this occurs, the patient may be unable to move and communicate that he or she is awake. The patient may feel severe pain. Older adults and adults with serious medical problems are more likely to have  complications than adults who are young and healthy. Some complications can be prevented by answering all of your caregiver's questions thoroughly and by following all pre-procedure instructions. It is important to tell your caregiver if any of the pre-procedure instructions, especially those related to diet, were not followed. Any food or liquid in the stomach can cause problems when you are under general anesthesia. BEFORE THE PROCEDURE  Ask your caregiver if you will have to spend the night at the hospital. If you will not have to spend the night, arrange to have an adult drive you and stay with you for 24 hours.  Follow your caregiver's instructions if you are taking dietary supplements or medicines. Your caregiver may tell you to stop taking them or to reduce your dosage.  Do not smoke for as long as possible before your procedure. If possible, stop smoking 3-6 weeks before the procedure.  Do not take new dietary supplements or medicines within 1 week of your procedure unless your caregiver approves them.  Do not eat within 8 hours of your procedure or as directed by your caregiver. Drink only clear liquids, such as water, black coffee (without milk or cream), and fruit juices (without pulp).  Do not drink within 3 hours of your procedure or as directed by your caregiver.  You may brush your teeth on the morning of the procedure, but make sure to spit out the toothpaste and water when finished. PROCEDURE  You will receive anesthetics through a mask, through an intravenous (IV) access tube, or through both. A doctor who specializes in anesthesia (anesthesiologist) or a nurse who specializes in anesthesia (nurse anesthetist) or both will stay with you throughout the procedure to make sure you remain unconscious. He or she will also watch your blood pressure, pulse, and oxygen levels to make sure that the anesthetics do not cause any problems. Once you are asleep, a breathing tube or mask may be  used to help you breathe. AFTER THE PROCEDURE You will wake up after the procedure is complete. You may be in the room where the procedure was performed or in a recovery area. You may have a sore throat if a breathing tube was used. You may also feel:  Dizzy.  Weak.  Drowsy.  Confused.  Nauseous.  Cold. These are all normal responses and can be expected to last for up to 24 hours after the procedure is complete. A caregiver will tell you when you are ready to go home.  This will usually be when you are fully awake and in stable condition.   This information is not intended to replace advice given to you by your health care provider. Make sure you discuss any questions you have with your health care provider.   Document Released: 01/24/2008 Document Revised: 11/07/2014 Document Reviewed: 02/15/2012 Elsevier Interactive Patient Education 2016 Elsevier Inc. General Anesthesia, Adult, Care After Refer to this sheet in the next few weeks. These instructions provide you with information on caring for yourself after your procedure. Your health care provider may also give you more specific instructions. Your treatment has been planned according to current medical practices, but problems sometimes occur. Call your health care provider if you have any problems or questions after your procedure. WHAT TO EXPECT AFTER THE PROCEDURE After the procedure, it is typical to experience:  Sleepiness.  Nausea and vomiting. HOME CARE INSTRUCTIONS  For the first 24 hours after general anesthesia:  Have a responsible person with you.  Do not drive a car. If you are alone, do not take public transportation.  Do not drink alcohol.  Do not take medicine that has not been prescribed by your health care provider.  Do not sign important papers or make important decisions.  You may resume a normal diet and activities as directed by your health care provider.  Change bandages (dressings) as  directed.  If you have questions or problems that seem related to general anesthesia, call the hospital and ask for the anesthetist or anesthesiologist on call. SEEK MEDICAL CARE IF:  You have nausea and vomiting that continue the day after anesthesia.  You develop a rash. SEEK IMMEDIATE MEDICAL CARE IF:   You have difficulty breathing.  You have chest pain.  You have any allergic problems.   This information is not intended to replace advice given to you by your health care provider. Make sure you discuss any questions you have with your health care provider.   Document Released: 01/23/2001 Document Revised: 11/07/2014 Document Reviewed: 02/15/2012 Elsevier Interactive Patient Education 2016 Elsevier Inc. PATIENT INSTRUCTIONS POST-ANESTHESIA  IMMEDIATELY FOLLOWING SURGERY:  Do not drive or operate machinery for the first twenty four hours after surgery.  Do not make any important decisions for twenty four hours after surgery or while taking narcotic pain medications or sedatives.  If you develop intractable nausea and vomiting or a severe headache please notify your doctor immediately.  FOLLOW-UP:  Please make an appointment with your surgeon as instructed. You do not need to follow up with anesthesia unless specifically instructed to do so.  WOUND CARE INSTRUCTIONS (if applicable):  Keep a dry clean dressing on the anesthesia/puncture wound site if there is drainage.  Once the wound has quit draining you may leave it open to air.  Generally you should leave the bandage intact for twenty four hours unless there is drainage.  If the epidural site drains for more than 36-48 hours please call the anesthesia department.  QUESTIONS?:  Please feel free to call your physician or the hospital operator if you have any questions, and they will be happy to assist you.

## 2016-08-25 ENCOUNTER — Encounter (HOSPITAL_COMMUNITY): Payer: Self-pay

## 2016-08-25 ENCOUNTER — Other Ambulatory Visit: Payer: Self-pay | Admitting: Obstetrics & Gynecology

## 2016-08-25 ENCOUNTER — Encounter (HOSPITAL_COMMUNITY)
Admission: RE | Admit: 2016-08-25 | Discharge: 2016-08-25 | Disposition: A | Discharge status: Home / Self Care | Payer: 59 | Source: Ambulatory Visit | Attending: Obstetrics & Gynecology | Admitting: Obstetrics & Gynecology

## 2016-08-25 DIAGNOSIS — N179 Acute kidney failure, unspecified: Secondary | ICD-10-CM | POA: Insufficient documentation

## 2016-08-25 DIAGNOSIS — Z01812 Encounter for preprocedural laboratory examination: Secondary | ICD-10-CM | POA: Insufficient documentation

## 2016-08-25 DIAGNOSIS — Z803 Family history of malignant neoplasm of breast: Secondary | ICD-10-CM | POA: Diagnosis not present

## 2016-08-25 DIAGNOSIS — Z8249 Family history of ischemic heart disease and other diseases of the circulatory system: Secondary | ICD-10-CM | POA: Insufficient documentation

## 2016-08-25 DIAGNOSIS — K7200 Acute and subacute hepatic failure without coma: Secondary | ICD-10-CM | POA: Diagnosis not present

## 2016-08-25 DIAGNOSIS — Z833 Family history of diabetes mellitus: Secondary | ICD-10-CM | POA: Insufficient documentation

## 2016-08-25 HISTORY — DX: Other complications of anesthesia, initial encounter: T88.59XA

## 2016-08-25 HISTORY — DX: Nausea with vomiting, unspecified: R11.2

## 2016-08-25 HISTORY — DX: Other specified postprocedural states: Z98.890

## 2016-08-25 HISTORY — DX: Adverse effect of unspecified anesthetic, initial encounter: T41.45XA

## 2016-08-25 LAB — CBC
HCT: 39 % (ref 36.0–46.0)
HEMOGLOBIN: 14 g/dL (ref 12.0–15.0)
MCH: 30.8 pg (ref 26.0–34.0)
MCHC: 35.9 g/dL (ref 30.0–36.0)
MCV: 85.9 fL (ref 78.0–100.0)
PLATELETS: 208 10*3/uL (ref 150–400)
RBC: 4.54 MIL/uL (ref 3.87–5.11)
RDW: 11.8 % (ref 11.5–15.5)
WBC: 3.5 10*3/uL — ABNORMAL LOW (ref 4.0–10.5)

## 2016-08-25 LAB — URINALYSIS, ROUTINE W REFLEX MICROSCOPIC
BILIRUBIN URINE: NEGATIVE
GLUCOSE, UA: NEGATIVE mg/dL
KETONES UR: NEGATIVE mg/dL
Nitrite: NEGATIVE
PROTEIN: NEGATIVE mg/dL
Specific Gravity, Urine: <1.005 — ABNORMAL LOW (ref 1.005–1.030)
pH: 6.5 (ref 5.0–8.0)

## 2016-08-25 LAB — URINE MICROSCOPIC-ADD ON

## 2016-08-25 LAB — COMPREHENSIVE METABOLIC PANEL
ALT: 16 U/L (ref 14–54)
AST: 14 U/L — AB (ref 15–41)
Albumin: 4.6 g/dL (ref 3.5–5.0)
Alkaline Phosphatase: 44 U/L (ref 38–126)
Anion gap: 4 — ABNORMAL LOW (ref 5–15)
BUN: 9 mg/dL (ref 6–20)
CHLORIDE: 104 mmol/L (ref 101–111)
CO2: 25 mmol/L (ref 22–32)
CREATININE: 0.72 mg/dL (ref 0.44–1.00)
Calcium: 9.3 mg/dL (ref 8.9–10.3)
Glucose, Bld: 79 mg/dL (ref 65–99)
POTASSIUM: 4.4 mmol/L (ref 3.5–5.1)
SODIUM: 133 mmol/L — AB (ref 135–145)
Total Bilirubin: 0.8 mg/dL (ref 0.3–1.2)
Total Protein: 7.2 g/dL (ref 6.5–8.1)

## 2016-08-25 LAB — TYPE AND SCREEN
ABO/RH(D): O NEG
ANTIBODY SCREEN: NEGATIVE

## 2016-08-25 LAB — HCG, QUANTITATIVE, PREGNANCY

## 2016-08-31 ENCOUNTER — Encounter (HOSPITAL_COMMUNITY)
Admission: RE | Disposition: A | Discharge status: Home / Self Care | Payer: Self-pay | Source: Ambulatory Visit | Attending: Obstetrics & Gynecology

## 2016-08-31 ENCOUNTER — Encounter (HOSPITAL_COMMUNITY): Payer: Self-pay

## 2016-08-31 ENCOUNTER — Inpatient Hospital Stay (HOSPITAL_COMMUNITY)
Admission: RE | Admit: 2016-08-31 | Discharge: 2016-09-01 | DRG: 743 | Disposition: A | Discharge status: Home / Self Care | Payer: 59 | Source: Ambulatory Visit | Attending: Obstetrics & Gynecology | Admitting: Obstetrics & Gynecology

## 2016-08-31 ENCOUNTER — Inpatient Hospital Stay (HOSPITAL_COMMUNITY): Payer: 59 | Admitting: Anesthesiology

## 2016-08-31 DIAGNOSIS — F17290 Nicotine dependence, other tobacco product, uncomplicated: Secondary | ICD-10-CM | POA: Diagnosis present

## 2016-08-31 DIAGNOSIS — N72 Inflammatory disease of cervix uteri: Secondary | ICD-10-CM | POA: Diagnosis not present

## 2016-08-31 DIAGNOSIS — N946 Dysmenorrhea, unspecified: Secondary | ICD-10-CM | POA: Diagnosis present

## 2016-08-31 DIAGNOSIS — N921 Excessive and frequent menstruation with irregular cycle: Secondary | ICD-10-CM | POA: Diagnosis not present

## 2016-08-31 DIAGNOSIS — N941 Unspecified dyspareunia: Secondary | ICD-10-CM | POA: Diagnosis not present

## 2016-08-31 DIAGNOSIS — Z8249 Family history of ischemic heart disease and other diseases of the circulatory system: Secondary | ICD-10-CM

## 2016-08-31 DIAGNOSIS — N920 Excessive and frequent menstruation with regular cycle: Principal | ICD-10-CM | POA: Diagnosis present

## 2016-08-31 DIAGNOSIS — Z803 Family history of malignant neoplasm of breast: Secondary | ICD-10-CM

## 2016-08-31 DIAGNOSIS — Z90710 Acquired absence of both cervix and uterus: Secondary | ICD-10-CM | POA: Diagnosis present

## 2016-08-31 DIAGNOSIS — N888 Other specified noninflammatory disorders of cervix uteri: Secondary | ICD-10-CM | POA: Diagnosis not present

## 2016-08-31 HISTORY — PX: ABDOMINAL HYSTERECTOMY: SHX81

## 2016-08-31 HISTORY — PX: SCAR REVISION: SHX5285

## 2016-08-31 HISTORY — DX: Unspecified convulsions: R56.9

## 2016-08-31 HISTORY — DX: Unspecified asthma, uncomplicated: J45.909

## 2016-08-31 HISTORY — DX: HELLP syndrome (HELLP), unspecified trimester: O14.20

## 2016-08-31 SURGERY — HYSTERECTOMY, ABDOMINAL
Anesthesia: General

## 2016-08-31 MED ORDER — INFLUENZA VAC SPLIT QUAD 0.5 ML IM SUSY
0.5000 mL | PREFILLED_SYRINGE | INTRAMUSCULAR | Status: DC
  Filled 2016-08-31: qty 0.5

## 2016-08-31 MED ORDER — SODIUM CHLORIDE 0.9 % IV SOLN: 8.0000 mg | Freq: Four times a day (QID) | INTRAVENOUS | Status: DC | PRN

## 2016-08-31 MED ORDER — ALUM & MAG HYDROXIDE-SIMETH 200-200-20 MG/5ML PO SUSP: 30.0000 mL | ORAL | Status: DC | PRN

## 2016-08-31 MED ORDER — ONDANSETRON HCL 4 MG/2ML IJ SOLN
INTRAMUSCULAR | Status: AC
  Filled 2016-08-31: qty 2

## 2016-08-31 MED ORDER — HYDROMORPHONE HCL 1 MG/ML IJ SOLN
0.2500 mg | INTRAMUSCULAR | Status: DC | PRN
  Administered 2016-08-31 (×3): 0.5 mg via INTRAVENOUS
  Filled 2016-08-31 (×2): qty 0.5

## 2016-08-31 MED ORDER — SUCCINYLCHOLINE CHLORIDE 20 MG/ML IJ SOLN
INTRAMUSCULAR | Status: AC
  Filled 2016-08-31: qty 1

## 2016-08-31 MED ORDER — SODIUM CHLORIDE 0.9 % IR SOLN
Status: DC | PRN
  Administered 2016-08-31: 2000 mL

## 2016-08-31 MED ORDER — ROCURONIUM BROMIDE 50 MG/5ML IV SOLN
INTRAVENOUS | Status: AC
  Filled 2016-08-31: qty 1

## 2016-08-31 MED ORDER — ARTIFICIAL TEARS OP OINT
TOPICAL_OINTMENT | OPHTHALMIC | Status: AC
  Filled 2016-08-31: qty 3.5

## 2016-08-31 MED ORDER — PROMETHAZINE HCL 25 MG/ML IJ SOLN
INTRAMUSCULAR | Status: AC
  Filled 2016-08-31: qty 1

## 2016-08-31 MED ORDER — SENNOSIDES-DOCUSATE SODIUM 8.6-50 MG PO TABS: 1.0000 | ORAL_TABLET | Freq: Every evening | ORAL | Status: DC | PRN

## 2016-08-31 MED ORDER — LIDOCAINE HCL (CARDIAC) 20 MG/ML IV SOLN
INTRAVENOUS | Status: DC | PRN
  Administered 2016-08-31: 40 mg via INTRAVENOUS

## 2016-08-31 MED ORDER — NEOSTIGMINE METHYLSULFATE 10 MG/10ML IV SOLN
INTRAVENOUS | Status: AC
  Filled 2016-08-31: qty 1

## 2016-08-31 MED ORDER — CEFAZOLIN SODIUM-DEXTROSE 2-4 GM/100ML-% IV SOLN
INTRAVENOUS | Status: AC
  Filled 2016-08-31: qty 100

## 2016-08-31 MED ORDER — GLYCOPYRROLATE 0.2 MG/ML IJ SOLN
INTRAMUSCULAR | Status: AC
  Filled 2016-08-31: qty 3

## 2016-08-31 MED ORDER — HYDROMORPHONE HCL 1 MG/ML IJ SOLN
1.0000 mg | INTRAMUSCULAR | Status: DC | PRN
  Administered 2016-08-31 (×2): 2 mg via INTRAVENOUS
  Administered 2016-08-31 – 2016-09-01 (×2): 1 mg via INTRAVENOUS
  Filled 2016-08-31: qty 1
  Filled 2016-08-31 (×3): qty 2

## 2016-08-31 MED ORDER — PROPOFOL 10 MG/ML IV BOLUS
INTRAVENOUS | Status: DC | PRN
  Administered 2016-08-31: 120 mg via INTRAVENOUS

## 2016-08-31 MED ORDER — PROMETHAZINE HCL 25 MG/ML IJ SOLN
6.2500 mg | INTRAMUSCULAR | Status: DC | PRN
  Administered 2016-08-31: 6.25 mg via INTRAVENOUS

## 2016-08-31 MED ORDER — CEFAZOLIN SODIUM-DEXTROSE 2-4 GM/100ML-% IV SOLN
2.0000 g | INTRAVENOUS | Status: AC
  Administered 2016-08-31: 2 g via INTRAVENOUS

## 2016-08-31 MED ORDER — HYDROMORPHONE HCL 1 MG/ML IJ SOLN
INTRAMUSCULAR | Status: AC
  Filled 2016-08-31: qty 0.5

## 2016-08-31 MED ORDER — SUFENTANIL CITRATE 50 MCG/ML IV SOLN
INTRAVENOUS | Status: AC
  Filled 2016-08-31: qty 1

## 2016-08-31 MED ORDER — DEXAMETHASONE SODIUM PHOSPHATE 4 MG/ML IJ SOLN
INTRAMUSCULAR | Status: AC
  Filled 2016-08-31: qty 1

## 2016-08-31 MED ORDER — ONDANSETRON HCL 4 MG PO TABS
8.0000 mg | ORAL_TABLET | Freq: Four times a day (QID) | ORAL | Status: DC | PRN
  Administered 2016-08-31: 8 mg via ORAL
  Filled 2016-08-31: qty 2

## 2016-08-31 MED ORDER — SUFENTANIL CITRATE 50 MCG/ML IV SOLN
INTRAVENOUS | Status: DC | PRN
  Administered 2016-08-31: 5 ug via INTRAVENOUS
  Administered 2016-08-31: 10 ug via INTRAVENOUS
  Administered 2016-08-31: 5 ug via INTRAVENOUS
  Administered 2016-08-31 (×3): 10 ug via INTRAVENOUS

## 2016-08-31 MED ORDER — OXYCODONE-ACETAMINOPHEN 5-325 MG PO TABS
1.0000 | ORAL_TABLET | ORAL | Status: DC | PRN
  Administered 2016-08-31 – 2016-09-01 (×4): 2 via ORAL
  Filled 2016-08-31 (×5): qty 2

## 2016-08-31 MED ORDER — KCL IN DEXTROSE-NACL 20-5-0.45 MEQ/L-%-% IV SOLN
INTRAVENOUS | Status: DC
  Administered 2016-08-31 (×2): via INTRAVENOUS

## 2016-08-31 MED ORDER — ZOLPIDEM TARTRATE 5 MG PO TABS
5.0000 mg | ORAL_TABLET | Freq: Every evening | ORAL | Status: DC | PRN
  Administered 2016-09-01: 5 mg via ORAL
  Filled 2016-08-31: qty 1

## 2016-08-31 MED ORDER — GLYCOPYRROLATE 0.2 MG/ML IJ SOLN
INTRAMUSCULAR | Status: DC | PRN
  Administered 2016-08-31: 0.6 mg via INTRAVENOUS

## 2016-08-31 MED ORDER — ROCURONIUM BROMIDE 100 MG/10ML IV SOLN
INTRAVENOUS | Status: DC | PRN
  Administered 2016-08-31: 35 mg via INTRAVENOUS

## 2016-08-31 MED ORDER — KETOROLAC TROMETHAMINE 30 MG/ML IJ SOLN
INTRAMUSCULAR | Status: AC
  Filled 2016-08-31: qty 1

## 2016-08-31 MED ORDER — LIDOCAINE HCL (PF) 1 % IJ SOLN
INTRAMUSCULAR | Status: AC
  Filled 2016-08-31: qty 5

## 2016-08-31 MED ORDER — SCOPOLAMINE 1 MG/3DAYS TD PT72
MEDICATED_PATCH | TRANSDERMAL | Status: AC
  Filled 2016-08-31: qty 1

## 2016-08-31 MED ORDER — ALBUTEROL SULFATE HFA 108 (90 BASE) MCG/ACT IN AERS: 1.0000 | INHALATION_SPRAY | Freq: Four times a day (QID) | RESPIRATORY_TRACT | Status: DC | PRN

## 2016-08-31 MED ORDER — ALBUTEROL SULFATE (2.5 MG/3ML) 0.083% IN NEBU: 2.5000 mg | INHALATION_SOLUTION | Freq: Four times a day (QID) | RESPIRATORY_TRACT | Status: DC | PRN

## 2016-08-31 MED ORDER — DEXAMETHASONE SODIUM PHOSPHATE 4 MG/ML IJ SOLN
4.0000 mg | Freq: Once | INTRAMUSCULAR | Status: AC
Start: 2016-08-31 — End: 2016-08-31
  Administered 2016-08-31: 4 mg via INTRAVENOUS

## 2016-08-31 MED ORDER — DOCUSATE SODIUM 100 MG PO CAPS
100.0000 mg | ORAL_CAPSULE | Freq: Two times a day (BID) | ORAL | Status: DC
  Administered 2016-08-31 – 2016-09-01 (×2): 100 mg via ORAL
  Filled 2016-08-31 (×2): qty 1

## 2016-08-31 MED ORDER — KETOROLAC TROMETHAMINE 30 MG/ML IJ SOLN
30.0000 mg | Freq: Once | INTRAMUSCULAR | Status: AC
  Administered 2016-08-31: 30 mg via INTRAVENOUS

## 2016-08-31 MED ORDER — LACTATED RINGERS IV SOLN
INTRAVENOUS | Status: DC
  Administered 2016-08-31 (×3): via INTRAVENOUS

## 2016-08-31 MED ORDER — NEOSTIGMINE METHYLSULFATE 10 MG/10ML IV SOLN
INTRAVENOUS | Status: DC | PRN
  Administered 2016-08-31: 4 mg via INTRAVENOUS

## 2016-08-31 MED ORDER — BUPIVACAINE LIPOSOME 1.3 % IJ SUSP
20.0000 mL | Freq: Once | INTRAMUSCULAR | Status: DC
  Filled 2016-08-31: qty 20

## 2016-08-31 MED ORDER — ONDANSETRON HCL 4 MG/2ML IJ SOLN
4.0000 mg | Freq: Once | INTRAMUSCULAR | Status: AC
  Administered 2016-08-31: 4 mg via INTRAVENOUS

## 2016-08-31 MED ORDER — MIDAZOLAM HCL 2 MG/2ML IJ SOLN
INTRAMUSCULAR | Status: AC
  Filled 2016-08-31: qty 2

## 2016-08-31 MED ORDER — BUPIVACAINE LIPOSOME 1.3 % IJ SUSP
INTRAMUSCULAR | Status: DC | PRN
  Administered 2016-08-31: 20 mL

## 2016-08-31 MED ORDER — SCOPOLAMINE 1 MG/3DAYS TD PT72
1.0000 | MEDICATED_PATCH | Freq: Once | TRANSDERMAL | Status: DC
  Administered 2016-08-31: 1.5 mg via TRANSDERMAL

## 2016-08-31 MED ORDER — NICOTINE 10 MG IN INHA: 1.0000 | Freq: Every day | RESPIRATORY_TRACT | Status: DC | PRN

## 2016-08-31 MED ORDER — PROPOFOL 10 MG/ML IV BOLUS
INTRAVENOUS | Status: AC
  Filled 2016-08-31: qty 40

## 2016-08-31 MED ORDER — BUPIVACAINE LIPOSOME 1.3 % IJ SUSP
INTRAMUSCULAR | Status: AC
  Filled 2016-08-31: qty 20

## 2016-08-31 MED ORDER — MIDAZOLAM HCL 2 MG/2ML IJ SOLN
1.0000 mg | INTRAMUSCULAR | Status: DC | PRN
  Administered 2016-08-31: 2 mg via INTRAVENOUS

## 2016-08-31 MED ORDER — BISACODYL 10 MG RE SUPP: 10.0000 mg | Freq: Every day | RECTAL | Status: DC | PRN

## 2016-08-31 MED ORDER — SODIUM CHLORIDE 0.9 % IJ SOLN
INTRAMUSCULAR | Status: AC
  Filled 2016-08-31: qty 10

## 2016-08-31 SURGICAL SUPPLY — 45 items
BAG HAMPER (MISCELLANEOUS) ×4 IMPLANT
CELLS DAT CNTRL 66122 CELL SVR (MISCELLANEOUS) ×2 IMPLANT
CLOTH BEACON ORANGE TIMEOUT ST (SAFETY) ×4 IMPLANT
COVER LIGHT HANDLE STERIS (MISCELLANEOUS) ×8 IMPLANT
DRAPE WARM FLUID 44X44 (DRAPE) ×4 IMPLANT
DRSG OPSITE POSTOP 4X10 (GAUZE/BANDAGES/DRESSINGS) ×4 IMPLANT
ELECT REM PT RETURN 9FT ADLT (ELECTROSURGICAL) ×4
ELECTRODE REM PT RTRN 9FT ADLT (ELECTROSURGICAL) ×2 IMPLANT
FORMALIN 10 PREFIL 120ML (MISCELLANEOUS) ×4 IMPLANT
FORMALIN 10 PREFIL 480ML (MISCELLANEOUS) ×4 IMPLANT
GAUZE SPONGE 4X4 12PLY STRL (GAUZE/BANDAGES/DRESSINGS) ×4 IMPLANT
GLOVE BIOGEL PI IND STRL 6.5 (GLOVE) ×2 IMPLANT
GLOVE BIOGEL PI IND STRL 7.0 (GLOVE) ×6 IMPLANT
GLOVE BIOGEL PI IND STRL 8 (GLOVE) ×2 IMPLANT
GLOVE BIOGEL PI INDICATOR 6.5 (GLOVE) ×2
GLOVE BIOGEL PI INDICATOR 7.0 (GLOVE) ×6
GLOVE BIOGEL PI INDICATOR 8 (GLOVE) ×2
GLOVE ECLIPSE 8.0 STRL XLNG CF (GLOVE) ×4 IMPLANT
GLOVE SURG SS PI 6.5 STRL IVOR (GLOVE) ×4 IMPLANT
GOWN STRL REUS W/TWL LRG LVL3 (GOWN DISPOSABLE) ×8 IMPLANT
GOWN STRL REUS W/TWL XL LVL3 (GOWN DISPOSABLE) ×4 IMPLANT
INST SET MAJOR GENERAL (KITS) ×4 IMPLANT
KIT ROOM TURNOVER APOR (KITS) ×4 IMPLANT
LIQUID BAND (GAUZE/BANDAGES/DRESSINGS) ×4 IMPLANT
MANIFOLD NEPTUNE II (INSTRUMENTS) ×4 IMPLANT
NEEDLE HYPO 21X1.5 SAFETY (NEEDLE) ×4 IMPLANT
NS IRRIG 1000ML POUR BTL (IV SOLUTION) ×8 IMPLANT
PACK ABDOMINAL MAJOR (CUSTOM PROCEDURE TRAY) ×4 IMPLANT
PAD ABD 5X9 TENDERSORB (GAUZE/BANDAGES/DRESSINGS) ×8 IMPLANT
PAD ABD 8X10 STRL (GAUZE/BANDAGES/DRESSINGS) ×8 IMPLANT
PAD ARMBOARD 7.5X6 YLW CONV (MISCELLANEOUS) ×4 IMPLANT
RTRCTR WOUND ALEXIS 18CM MED (MISCELLANEOUS) ×4
SET BASIN LINEN APH (SET/KITS/TRAYS/PACK) ×4 IMPLANT
SPONGE LAP 18X18 X RAY DECT (DISPOSABLE) ×12 IMPLANT
SUT CHROMIC 0 CT 1 (SUTURE) ×4 IMPLANT
SUT MON AB 3-0 SH 27 (SUTURE) ×12 IMPLANT
SUT VIC AB 0 CT1 27 (SUTURE) ×6
SUT VIC AB 0 CT1 27XCR 8 STRN (SUTURE) ×6 IMPLANT
SUT VIC AB 0 CTX 36 (SUTURE) ×2
SUT VIC AB 0 CTX36XBRD ANTBCTR (SUTURE) ×2 IMPLANT
SUT VICRYL 3 0 (SUTURE) ×4 IMPLANT
SYR 20CC LL (SYRINGE) ×4 IMPLANT
TAPE CLOTH SURG 4X10 WHT LF (GAUZE/BANDAGES/DRESSINGS) ×4 IMPLANT
TOWEL BLUE STERILE X RAY DET (MISCELLANEOUS) ×4 IMPLANT
TRAY FOLEY W/METER SILVER 16FR (SET/KITS/TRAYS/PACK) ×4 IMPLANT

## 2016-08-31 NOTE — Anesthesia Postprocedure Evaluation (Signed)
Anesthesia Post Note  Patient: Arlan OrganDevin L Lipsey  Procedure(s) Performed: Procedure(s) (LRB): ABDOMINAL HYSTERECTOMY WITH REMOVAL OF CERVIX (N/A) REVISION OF ABDOMINAL WALL SCAR  Patient location during evaluation: PACU Anesthesia Type: General Level of consciousness: awake and alert and oriented Pain management: pain level controlled Respiratory status: spontaneous breathing Postop Assessment: no signs of nausea or vomiting Anesthetic complications: no    Last Vitals:  Vitals:   08/31/16 0725 08/31/16 0935  BP: 97/63 124/83  Pulse:    Resp: 19 12  Temp:  36.6 C    Last Pain:  Vitals:   08/31/16 0935  TempSrc:   PainSc: 5                  ADAMS, AMY A

## 2016-08-31 NOTE — Anesthesia Procedure Notes (Signed)
Procedure Name: Intubation Date/Time: 08/31/2016 7:37 AM Performed by: Pernell DupreADAMS, Alexis Ackert A Pre-anesthesia Checklist: Patient identified, Patient being monitored, Timeout performed, Emergency Drugs available and Suction available Patient Re-evaluated:Patient Re-evaluated prior to inductionOxygen Delivery Method: Circle System Utilized Preoxygenation: Pre-oxygenation with 100% oxygen Intubation Type: IV induction Ventilation: Mask ventilation without difficulty Laryngoscope Size: Miller and 3 Grade View: Grade I Tube type: Oral Tube size: 7.0 mm Number of attempts: 1 Airway Equipment and Method: Stylet Placement Confirmation: ETT inserted through vocal cords under direct vision,  positive ETCO2 and breath sounds checked- equal and bilateral Secured at: 21 cm Tube secured with: Tape Dental Injury: Teeth and Oropharynx as per pre-operative assessment

## 2016-08-31 NOTE — H&P (Signed)
Alexis ArmsLuther H Eure, MD  Obstetrics/Gynecology  Expand All Collapse All   [] Hide copied text [] Hover for attribution information Preoperative History and Physical  Alexis BromeDevin L White BackFinney is a 30 y.o. [phone] with No LMP recorded. Patient has had an injection. admitted for a abdominal hysterectomy, removal of tubes.  Pt has a lifelong history of menorrhagia and dysmenorrhea which essentially debilitates patient for several days at the time each month.  She is managed with depo provera even though she has had a tubal ligation, however she is started to be concerned about long term side effects of depo provera such as osteoporosis and mood disruption and she cannot really go about regular life otherwise. Additionally she has developed dyspareunia, bump, every time she ahs intercourse.  Exam reveals that to be an adherent uterus to her abdomian wall from her previous Caesarean sections.  She does not have lateral pain with menses or intercourse  PMH:        Past Medical History:  Diagnosis Date  . Absence seizure (HCC)   . Acute kidney failure following labor and delivery   . Acute liver failure   . Kidney infection     PSH:          Past Surgical History:  Procedure Laterality Date  . BREAST ENHANCEMENT SURGERY    . CESAREAN SECTION    . TUBAL LIGATION      POb/GynH:              OB History    Gravida Para Term Preterm AB Living   4       1 3    SAB TAB Ectopic Multiple Live Births   1       3      SH:          Social History  Substance Use Topics  . Smoking status: Current Every Day Smoker    Types: E-cigarettes  . Smokeless tobacco: Never Used  . Alcohol use Yes      Comment: occasional wine cooler    FH:          Family History  Problem Relation Age of Onset  . Heart disease Father   . Cancer Maternal Aunt     skin  . Breast cancer Maternal Aunt   . Cancer Paternal Grandfather     bladder  . Heart disease Maternal Grandmother   .  Congestive Heart Failure Maternal Grandmother   . Cancer Maternal Aunt     skin  . Breast cancer Maternal Aunt   . Nevi Mother   . Diabetes Other   . Heart disease Other      Allergies:      Allergies  Allergen Reactions  . Ciprofloxacin Hcl   . Coppertone Kids Spf15 [Albolene] Hives  . Pollen Extract Hives    Medications:       Current Outpatient Prescriptions:  .  medroxyPROGESTERone (DEPO-PROVERA) 150 MG/ML injection, Inject 150 mg into the muscle every 3 (three) months., Disp: , Rfl:   Review of Systems:   Review of Systems  Constitutional: Negative for fever, chills, weight loss, malaise/fatigue and diaphoresis.  HENT: Negative for hearing loss, ear pain, nosebleeds, congestion, sore throat, neck pain, tinnitus and ear discharge.   Eyes: Negative for blurred vision, double vision, photophobia, pain, discharge and redness.  Respiratory: Negative for cough, hemoptysis, sputum production, shortness of breath, wheezing and stridor.   Cardiovascular: Negative for chest pain, palpitations, orthopnea, claudication, leg swelling and PND.  Gastrointestinal:  Positive for abdominal pain. Negative for heartburn, nausea, vomiting, diarrhea, constipation, blood in stool and melena.  Genitourinary: Negative for dysuria, urgency, frequency, hematuria and flank pain.  Musculoskeletal: Negative for myalgias, back pain, joint pain and falls.  Skin: Negative for itching and rash.  Neurological: Negative for dizziness, tingling, tremors, sensory change, speech change, focal weakness, seizures, loss of consciousness, weakness and headaches.  Endo/Heme/Allergies: Negative for environmental allergies and polydipsia. Does not bruise/bleed easily.  Psychiatric/Behavioral: Negative for depression, suicidal ideas, hallucinations, memory loss and substance abuse. The patient is not nervous/anxious and does not have insomnia.      PHYSICAL EXAM:  Blood pressure 100/60, pulse 80,  height 5' 3.4" (1.61 m), weight 152 lb (68.9 kg).    Vitals reviewed. Constitutional: She is oriented to person, place, and time. She appears well-developed and well-nourished.  HENT:  Head: Normocephalic and atraumatic.  Right Ear: External ear normal.  Left Ear: External ear normal.  Nose: Nose normal.  Mouth/Throat: Oropharynx is clear and moist.  Eyes: Conjunctivae and EOM are normal. Pupils are equal, round, and reactive to light. Right eye exhibits no discharge. Left eye exhibits no discharge. No scleral icterus.  Neck: Normal range of motion. Neck supple. No tracheal deviation present. No thyromegaly present.  Cardiovascular: Normal rate, regular rhythm, normal heart sounds and intact distal pulses.  Exam reveals no gallop and no friction rub.   No murmur heard. Respiratory: Effort normal and breath sounds normal. No respiratory distress. She has no wheezes. She has no rales. She exhibits no tenderness.  GI: Soft. Bowel sounds are normal. She exhibits no distension and no mass. There is tenderness. There is no rebound and no guarding.  Genitourinary:       Vulva is normal without lesions Vagina is pink moist without discharge Cervix normal in appearance and pap is normal, tender to movement Uterus is tender adherent to the anterior abdominal wall by adhesions from her 2 c sections Adnexa is negative with normal sized ovaries by sonogram  Musculoskeletal: Normal range of motion. She exhibits no edema and no tenderness.  Neurological: She is alert and oriented to person, place, and time. She has normal reflexes. She displays normal reflexes. No cranial nerve deficit. She exhibits normal muscle tone. Coordination normal.  Skin: Skin is warm and dry. No rash noted. No erythema. No pallor.  Psychiatric: She has a normal mood and affect. Her behavior is normal. Judgment and thought content normal.    Labs: No results found for this or any previous visit (from the past 336  hour(s)).  EKG: No orders found for this or any previous visit.  Imaging Studies: ImagingResults  No results found.      Assessment: Dyspareunia, female  Dysmenorrhea  Menorrhagia with regular cycle     Plan: Abdominal hysterectomy remove any remaining tubal tissue, plan to preserve ovaries unless otherwise indicated intra operatively  White,LUTHER H 08/04/2016 10:52 AM

## 2016-08-31 NOTE — Op Note (Signed)
Preoperative diagnosis:  1.  Dysmenorrhea                                          2.  menorrhagia                                         3.  dyspareunia                                         4.    Postoperative diagnosis:  Same as above  Procedure:  Abdominal hysterectomy, supracervical, with revision of abdominal wall scar  Surgeon:  Lazaro ArmsEURE,Alita Waldren H  Assistant:    Anesthesia:  General endotracheal  Preoperative clinical summary:  Alexis L Finneyis a 30 y.o.G4P0013 with No LMP recorded. Patient has had an injection.admitted for a abdominal hysterectomy, removal of tubes.  Pt has a lifelong history of menorrhagia and dysmenorrhea which essentially debilitates patient for several days at the time each month. She is managed with depo provera even though she has had a tubal ligation, however she is started to be concerned about long term side effects of depo provera such as osteoporosis and mood disruption and she cannot really go about regular life otherwise. Additionally she has developed dyspareunia, bump, every time she ahs intercourse. Exam reveals that to be an adherent uterus to her abdomian wall from her previous Caesarean sections. She does not have lateral pain with menses or intercourse    Intraoperative findings: dense adhesions of the uterus to the bladder, normal ovaries, no endometriosis  Description of operation:  Patient was taken to the operating room and placed in the supine position where she underwent general endotracheal anesthesia.  She was then prepped and draped in the usual sterile fashion and a Foley catheter was placed for continuous bladder drainage.  A Pfannenstiel skin incision was made and carried down sharply to the rectus fascia which was scored in the midline and extended laterally.  The fascia was taken off the muscles superiorly and inferiorly without difficulty.  The muscles were divided.  The peritoneal cavity was entered.  An medium Alexis self-retaining  retractor was placed.  The upper abdomen was packed away. Both uterine cornu were grasped with Coker clamps.  The left round ligament was suture ligated and coagulated with the electrocautery unit.  The left vesicouterine serosal flap was created.  An avascular window in in the peritoneum was created and the utero-ovarian ligament was cross clamped, cut and suture ligated.  The right round ligament was suture ligated and cut with the electrocautery unit.  The vesicouterine serosal flap on the right was created.  An avascular window in the peritoneum was created and the right utero-ovarian ligament was cross clamped, cut and double suture ligated.  Thus both ovaries were preserved.  The uterine vessels were skeletonized bilaterally.  The uterine vessels were clamped bilaterally,  then cut and suture ligated.  Two more pedicles were taken down the cervix medial to the uterine vessels.  Each pedicle was clamped cut and suture ligated with good resulting hemostasis.  As per the preoperative plan the cervix was then transected sharply and the specimen was removed.  The cervical stump was then  closed anterior to posterior for hemostasis and reduce postoperative adhesions.  The pelvis was irrigated vigorously and all pedicles were examined and found to be hemostatic.  Seprafilm was then placed in the pelvis to reduce postoperative adhesion formation.  All specimens were sent to pathology for routine evaluation.  The Alexis self-retaining retractor was removed and the pelvis was irrigated vigorously.  All packs were removed and all counts were correct at this point x 3.  The muscles and peritoneum were reapproximated loosely.  The fascia was closed with 0 Vicryl running.  20 cc exparel was injected.  A wide excision of the abdominal wall scar was excised.    The skin was closed using 3-0 Vicryl on a Keith needle in a subcuticular fashion.  liquiban was then applied for additional wound integrity and to serve as a  postoperative bacterial barrier.  The patient was awakened from anesthesia taken to the recovery room in good stable condition. All sponge instrument and needle counts were correct x 3.  The patient received Ancef and Toradol prophylactically preoperatively.  Estimated blood loss for the procedure was 150  cc.  Lazaro ArmsEURE,Coben Godshall H, MD 08/31/2016 9:50 AM

## 2016-08-31 NOTE — Anesthesia Preprocedure Evaluation (Signed)
Anesthesia Evaluation  Patient identified by MRN, date of birth, ID band Patient awake    Reviewed: Allergy & Precautions, NPO status , Patient's Chart, lab work & pertinent test results  History of Anesthesia Complications (+) PONV and history of anesthetic complications  Airway Mallampati: I  TM Distance: >3 FB     Dental  (+) Teeth Intact   Pulmonary asthma , Current Smoker,    breath sounds clear to auscultation       Cardiovascular negative cardio ROS   Rhythm:Regular Rate:Normal     Neuro/Psych Seizures -, Well Controlled,     GI/Hepatic negative GI ROS, Neg liver ROS,   Endo/Other  negative endocrine ROS  Renal/GU negative Renal ROS     Musculoskeletal   Abdominal   Peds  Hematology   Anesthesia Other Findings   Reproductive/Obstetrics                             Anesthesia Physical Anesthesia Plan  ASA: II  Anesthesia Plan: General   Post-op Pain Management:    Induction: Intravenous  Airway Management Planned: Oral ETT  Additional Equipment:   Intra-op Plan:   Post-operative Plan: Extubation in OR  Informed Consent: I have reviewed the patients History and Physical, chart, labs and discussed the procedure including the risks, benefits and alternatives for the proposed anesthesia with the patient or authorized representative who has indicated his/her understanding and acceptance.     Plan Discussed with:   Anesthesia Plan Comments:         Anesthesia Quick Evaluation

## 2016-08-31 NOTE — Interval H&P Note (Signed)
History and Physical Interval Note:  08/31/2016 7:07 AM  Alexis White  has presented today for surgery, with the diagnosis of menorrhagia dysmenorrhea dyspareunia  The various methods of treatment have been discussed with the patient and family. After consideration of risks, benefits and other options for treatment, the patient has consented to  Procedure(s): HYSTERECTOMY ABDOMINAL (N/A) as a surgical intervention .  The patient's history has been reviewed, patient examined, no change in status, stable for surgery.  I have reviewed the patient's chart and labs.  Questions were answered to the patient's satisfaction.     Kailly Richoux H

## 2016-08-31 NOTE — Transfer of Care (Signed)
Immediate Anesthesia Transfer of Care Note  Patient: Alexis White L Chriscoe  Procedure(s) Performed: Procedure(s): ABDOMINAL HYSTERECTOMY WITH REMOVAL OF CERVIX (N/A) REVISION OF ABDOMINAL WALL SCAR  Patient Location: PACU  Anesthesia Type:General  Level of Consciousness: awake, oriented and patient cooperative  Airway & Oxygen Therapy: Patient Spontanous Breathing and Patient connected to face mask oxygen  Post-op Assessment: Report given to RN and Post -op Vital signs reviewed and stable  Post vital signs: Reviewed and stable  Last Vitals:  Vitals:   08/31/16 0720 08/31/16 0725  BP: 101/65 97/63  Pulse:    Resp: 16 19  Temp:      Last Pain:  Vitals:   08/31/16 0647  TempSrc: Oral      Patients Stated Pain Goal: 7 (08/31/16 0647)  Complications: No apparent anesthesia complications

## 2016-09-01 DIAGNOSIS — N946 Dysmenorrhea, unspecified: Secondary | ICD-10-CM | POA: Diagnosis present

## 2016-09-01 DIAGNOSIS — N920 Excessive and frequent menstruation with regular cycle: Secondary | ICD-10-CM | POA: Diagnosis present

## 2016-09-01 DIAGNOSIS — F17290 Nicotine dependence, other tobacco product, uncomplicated: Secondary | ICD-10-CM | POA: Diagnosis present

## 2016-09-01 DIAGNOSIS — Z803 Family history of malignant neoplasm of breast: Secondary | ICD-10-CM | POA: Diagnosis not present

## 2016-09-01 DIAGNOSIS — Z8249 Family history of ischemic heart disease and other diseases of the circulatory system: Secondary | ICD-10-CM | POA: Diagnosis not present

## 2016-09-01 DIAGNOSIS — N941 Unspecified dyspareunia: Secondary | ICD-10-CM | POA: Diagnosis present

## 2016-09-01 LAB — CBC
HCT: 33.9 % — ABNORMAL LOW (ref 36.0–46.0)
Hemoglobin: 12 g/dL (ref 12.0–15.0)
MCH: 30.5 pg (ref 26.0–34.0)
MCHC: 35.4 g/dL (ref 30.0–36.0)
MCV: 86.3 fL (ref 78.0–100.0)
PLATELETS: 214 10*3/uL (ref 150–400)
RBC: 3.93 MIL/uL (ref 3.87–5.11)
RDW: 12 % (ref 11.5–15.5)
WBC: 6.7 10*3/uL (ref 4.0–10.5)

## 2016-09-01 LAB — BASIC METABOLIC PANEL
ANION GAP: 6 (ref 5–15)
CO2: 24 mmol/L (ref 22–32)
Calcium: 8.7 mg/dL — ABNORMAL LOW (ref 8.9–10.3)
Chloride: 105 mmol/L (ref 101–111)
Creatinine, Ser: 0.51 mg/dL (ref 0.44–1.00)
GFR calc Af Amer: >60 mL/min (ref >60)
Glucose, Bld: 99 mg/dL (ref 65–99)
POTASSIUM: 3.8 mmol/L (ref 3.5–5.1)
SODIUM: 135 mmol/L (ref 135–145)

## 2016-09-01 MED ORDER — KETOROLAC TROMETHAMINE 10 MG PO TABS: 10.0000 mg | ORAL_TABLET | Freq: Three times a day (TID) | ORAL | 0 refills | Status: DC | PRN

## 2016-09-01 MED ORDER — OXYCODONE-ACETAMINOPHEN 5-325 MG PO TABS: 1.0000 | ORAL_TABLET | ORAL | 0 refills | Status: DC | PRN

## 2016-09-01 MED ORDER — ONDANSETRON HCL 8 MG PO TABS: 8.0000 mg | ORAL_TABLET | Freq: Four times a day (QID) | ORAL | 0 refills | Status: DC | PRN

## 2016-09-01 NOTE — Progress Notes (Signed)
Discharge instructions and prescription given, verbalized understanding, out in stable condition via w/c with staff. 

## 2016-09-01 NOTE — Anesthesia Postprocedure Evaluation (Signed)
Anesthesia Post Note  Patient: Alexis White  Procedure(s) Performed: Procedure(s) (LRB): ABDOMINAL HYSTERECTOMY WITH REMOVAL OF CERVIX (N/A) REVISION OF ABDOMINAL WALL SCAR  Patient location during evaluation: Nursing Unit Anesthesia Type: General Level of consciousness: awake and alert, oriented and patient cooperative Pain management: pain level controlled Vital Signs Assessment: post-procedure vital signs reviewed and stable Respiratory status: spontaneous breathing, nonlabored ventilation and respiratory function stable Cardiovascular status: blood pressure returned to baseline and stable Postop Assessment: no signs of nausea or vomiting and adequate PO intake Anesthetic complications: no    Last Vitals:  Vitals:   09/01/16 0100 09/01/16 0500  BP: 117/74 108/64  Pulse: 66 70  Resp: 11 16  Temp: 36.6 C 37.2 C    Last Pain:  Vitals:   09/01/16 0500  TempSrc: Oral  PainSc:                  Alexis White

## 2016-09-01 NOTE — Addendum Note (Signed)
Addendum  created 09/01/16 1026 by Despina Hiddenobert J Korayma Hagwood, CRNA   Sign clinical note

## 2016-09-01 NOTE — Discharge Instructions (Signed)
Abdominal Hysterectomy, Care After °Refer to this sheet in the next few weeks. These instructions provide you with information on caring for yourself after your procedure. Your health care provider may also give you more specific instructions. Your treatment has been planned according to current medical practices, but problems sometimes occur. Call your health care provider if you have any problems or questions after your procedure.  °WHAT TO EXPECT AFTER THE PROCEDURE °After your procedure, it is typical to have the following: °· Pain. °· Feeling tired. °· Poor appetite. °· Less interest in sex. °It takes 4-6 weeks to recover from this surgery.  °HOME CARE INSTRUCTIONS  °· Take pain medicines only as directed by your health care provider. Do not take over-the-counter pain medicines without checking with your health care provider first.  °· Change your bandage as directed by your health care provider. °· Return to your health care provider to have your sutures taken out. °· Take showers instead of baths for 2-3 weeks. Ask your health care provider when it is safe to start showering.  °· Do not douche, use tampons, or have sexual intercourse for at least 6 weeks or until your health care provider says you can.   °· Follow your health care provider's advice about exercise, lifting, driving, and general activities. °· Get plenty of rest and sleep.   °· Do not lift anything heavier than a gallon of milk (about 10 lb [4.5 kg]) for the first month after surgery. °· You can resume your normal diet if your health care provider says it is okay.   °· Do not drink alcohol until your health care provider says you can.   °· If you are constipated, ask your health care provider if you can take a mild laxative. °· Eating foods high in fiber may also help with constipation. Eat plenty of raw fruits and vegetables, whole grains, and beans. °· Drink enough fluids to keep your urine clear or pale yellow.   °· Try to have someone at  home with you for the first 1-2 weeks to help around the house. °· Keep all follow-up appointments. °SEEK MEDICAL CARE IF:  °· You have chills or fever. °· You have swelling, redness, or pain in the area of your incision that is getting worse.   °· You have pus coming from the incision.   °· You notice a bad smell coming from the incision or bandage.   °· Your incision breaks open.   °· You feel dizzy or light-headed.   °· You have pain or bleeding when you urinate.   °· You have persistent diarrhea.   °· You have persistent nausea and vomiting.   °· You have abnormal vaginal discharge.   °· You have a rash.   °· You have any type of abnormal reaction or develop an allergy to your medicine.   °· Your pain medicine is not helping.   °SEEK IMMEDIATE MEDICAL CARE IF:  °· You have a fever and your symptoms suddenly get worse. °· You have severe abdominal pain. °· You have chest pain. °· You have shortness of breath. °· You faint. °· You have pain, swelling, or redness of your leg. °· You have heavy vaginal bleeding with blood clots. °MAKE SURE YOU: °· Understand these instructions. °· Will watch your condition. °· Will get help right away if you are not doing well or get worse. °  °This information is not intended to replace advice given to you by your health care provider. Make sure you discuss any questions you have with your health care provider. °  °Document   Released: 05/06/2005 Document Revised: 11/07/2014 Document Reviewed: 08/09/2013 °Elsevier Interactive Patient Education ©2016 [employer] ° °

## 2016-09-02 ENCOUNTER — Encounter (HOSPITAL_COMMUNITY): Payer: Self-pay | Admitting: Obstetrics & Gynecology

## 2016-09-08 ENCOUNTER — Encounter: Payer: Self-pay | Admitting: Obstetrics & Gynecology

## 2016-09-08 ENCOUNTER — Ambulatory Visit (INDEPENDENT_AMBULATORY_CARE_PROVIDER_SITE_OTHER): Payer: 59 | Admitting: Obstetrics & Gynecology

## 2016-09-08 VITALS — BP 110/80 | HR 74 | Wt 148.0 lb

## 2016-09-08 DIAGNOSIS — Z9889 Other specified postprocedural states: Secondary | ICD-10-CM

## 2016-09-08 DIAGNOSIS — Z90710 Acquired absence of both cervix and uterus: Secondary | ICD-10-CM

## 2016-09-08 NOTE — Progress Notes (Signed)
  HPI: Patient returns for routine postoperative follow-up having undergone TAH on 08/31/2016.  The patient's immediate postoperative recovery has been unremarkable. Since hospital discharge the patient reports no problems.   Current Outpatient Prescriptions: ketorolac (TORADOL) 10 MG tablet, Take 1 tablet (10 mg total) by mouth every 8 (eight) hours as needed., Disp: 15 tablet, Rfl: 0 NICOTINE IN, Inhale 1 Dose into the lungs daily as needed (for appropriate nicotine strength). Nicotine E-liquid, Disp: , Rfl:   No current facility-administered medications for this visit.     Blood pressure 110/80, pulse 74, weight 148 lb (67.1 kg).  Physical Exam: Incision clean dry intact liquiban intact  Diagnostic Tests:   Pathology: benign  Impression: S/P TAH wound reviion  Plan: Increase activities slowly Follow up: 4  weeks   No meds reuested  Lazaro ArmsEURE,Lovelle Deitrick H, MD

## 2016-09-13 ENCOUNTER — Encounter: Payer: 59 | Admitting: Obstetrics & Gynecology

## 2016-10-06 ENCOUNTER — Encounter: Payer: Self-pay | Admitting: Obstetrics & Gynecology

## 2016-10-06 ENCOUNTER — Ambulatory Visit (INDEPENDENT_AMBULATORY_CARE_PROVIDER_SITE_OTHER): Payer: 59 | Admitting: Obstetrics & Gynecology

## 2016-10-06 VITALS — BP 90/50 | HR 93

## 2016-10-06 DIAGNOSIS — Z9889 Other specified postprocedural states: Secondary | ICD-10-CM

## 2016-10-06 DIAGNOSIS — Z90710 Acquired absence of both cervix and uterus: Secondary | ICD-10-CM

## 2016-10-06 NOTE — Progress Notes (Signed)
  HPI: Patient returns for routine postoperative follow-up having undergone abdominal hysterectomy with removal of both tubes on 08/31/2016.  The patient's immediate postoperative recovery has been unremarkable. Since hospital discharge the patient reports no problems.   Current Outpatient Prescriptions: NICOTINE IN, Inhale 1 Dose into the lungs daily as needed (for appropriate nicotine strength). Nicotine E-liquid, Disp: , Rfl:  ketorolac (TORADOL) 10 MG tablet, Take 1 tablet (10 mg total) by mouth every 8 (eight) hours as needed. (Patient not taking: Reported on 10/06/2016), Disp: 15 tablet, Rfl: 0  No current facility-administered medications for this visit.     Blood pressure (!) 90/50, pulse 93.  Physical Exam: Incision clean dry intact Vagina healing well  Diagnostic Tests:   Pathology: benign  Impression: S/p TAH  Plan: No sex for 2 weeks at least  Follow up: 1  years  Lazaro ArmsEURE,Iyesha Such H, MD

## 2016-10-06 NOTE — Discharge Summary (Signed)
Physician Discharge Summary  Patient ID: Alexis White MRN: [id number] DOB/AGE: [birth year] 30 y.o.  Admit date: 08/31/2016 Discharge date: 10/06/2016  Admission Diagnoses: Dyspareunia dysmenorrhea  Discharge Diagnoses:  Active Problems:   S/P hysterectomy   Discharged Condition: good  Hospital Course: unremarkable  Consults: None  Significant Diagnostic Studies: labs:   Treatments:   Discharge Exam: Blood pressure 108/64, pulse 70, temperature 98.9 F (37.2 C), temperature source Oral, resp. rate 16, height 5\' 4"  (1.626 m), weight 147 lb (66.7 kg), SpO2 100 %. General appearance: alert, cooperative and no distress GI: soft, non-tender; bowel sounds normal; no masses,  no organomegaly and incision clean dry  Disposition: 01-Home or Self Care  Discharge Instructions    Call MD for:  persistant nausea and vomiting    Complete by:  As directed    Call MD for:  severe uncontrolled pain    Complete by:  As directed    Call MD for:  temperature >100.4    Complete by:  As directed    Diet - low sodium heart healthy    Complete by:  As directed    Driving Restrictions    Complete by:  As directed    No driving for 1 week   Increase activity slowly    Complete by:  As directed    Leave dressing on - Keep it clean, dry, and intact until clinic visit    Complete by:  As directed    Lifting restrictions    Complete by:  As directed    Do not lift more than 10 pounds   Sexual Activity Restrictions    Complete by:  As directed    You're joking right?       Medication List    STOP taking these medications   ibuprofen 200 MG tablet Commonly known as:  ADVIL,MOTRIN   medroxyPROGESTERone 150 MG/ML injection Commonly known as:  DEPO-PROVERA     TAKE these medications   ketorolac 10 MG tablet Commonly known as:  TORADOL Take 1 tablet (10 mg total) by mouth every 8 (eight) hours as needed.   NICOTINE IN Inhale 1 Dose into the lungs daily as needed (for appropriate  nicotine strength). Nicotine E-liquid      Follow-up Information    Lazaro ArmsEURE,LUTHER H, MD Follow up on 09/08/2016.   Specialties:  Obstetrics and Gynecology, Radiology Why:  1030 am for post op Contact information: [address] [place] [address] [phone]           Signed: Lazaro ArmsURE,LUTHER H 10/06/2016, 11:08 AM

## 2016-12-20 ENCOUNTER — Ambulatory Visit: Payer: 59 | Admitting: Family Medicine

## 2016-12-26 ENCOUNTER — Ambulatory Visit (INDEPENDENT_AMBULATORY_CARE_PROVIDER_SITE_OTHER): Payer: 59 | Admitting: Family Medicine

## 2016-12-26 ENCOUNTER — Encounter: Payer: Self-pay | Admitting: Family Medicine

## 2016-12-26 VITALS — BP 110/72 | HR 80 | Temp 98.4°F | Resp 16 | Ht 63.0 in | Wt 155.0 lb

## 2016-12-26 DIAGNOSIS — Z7689 Persons encountering health services in other specified circumstances: Secondary | ICD-10-CM

## 2016-12-26 DIAGNOSIS — R41840 Attention and concentration deficit: Secondary | ICD-10-CM

## 2016-12-26 DIAGNOSIS — F17200 Nicotine dependence, unspecified, uncomplicated: Secondary | ICD-10-CM | POA: Diagnosis not present

## 2016-12-26 DIAGNOSIS — Z8249 Family history of ischemic heart disease and other diseases of the circulatory system: Secondary | ICD-10-CM | POA: Diagnosis not present

## 2016-12-26 LAB — LIPID PANEL
CHOLESTEROL: 148 mg/dL (ref <200)
HDL: 50 mg/dL — ABNORMAL LOW (ref >50)
LDL Cholesterol: 92 mg/dL (ref <100)
Total CHOL/HDL Ratio: 3 Ratio (ref <5.0)
Triglycerides: 30 mg/dL (ref <150)
VLDL: 6 mg/dL (ref <30)

## 2016-12-26 NOTE — Progress Notes (Signed)
Chief Complaint  Patient presents with  . Establish Care   New to establish No ongoing medical issues On no prescription medicine Eats well and exercises Refuses flushot Recent hysterectomy   Patient Active Problem List   Diagnosis Date Noted  . Family history of heart disease in female family member before age 29 12/26/2016  . Nicotine dependence 12/26/2016  . Inattention 12/26/2016  . S/P hysterectomy 08/31/2016    Outpatient Encounter Prescriptions as of 12/26/2016  Medication Sig  . NICOTINE IN Inhale 1 Dose into the lungs daily as needed (for appropriate nicotine strength). Nicotine E-liquid   No facility-administered encounter medications on file as of 12/26/2016.     Past Medical History:  Diagnosis Date  . Absence seizure (HCC)   . Acute kidney failure following labor and delivery   . Acute liver failure   . Allergy   . Anemia   . Asthma   . Complication of anesthesia   . Depression   . HELLP syndrome in first trimester 2009  . HELLP syndrome in first trimester   . Kidney infection   . PONV (postoperative nausea and vomiting)   . Seizures (HCC)     Past Surgical History:  Procedure Laterality Date  . ABDOMINAL HYSTERECTOMY N/A 08/31/2016   Procedure: ABDOMINAL HYSTERECTOMY WITH REMOVAL OF CERVIX;  Surgeon: [name], MD;  Location: [facility];  Service: Gynecology;  Laterality: N/A;  . BREAST ENHANCEMENT SURGERY    . CESAREAN SECTION    . SCAR REVISION  08/31/2016   Procedure: REVISION OF ABDOMINAL WALL SCAR;  Surgeon: [name], MD;  Location: [facility];  Service: Gynecology;;  . TUBAL LIGATION      Social History   Social History  . Marital status: Married    Spouse name: Shaun  . Number of children: 3  . Years of education: 2813   Occupational History  . 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 Smoking status: Former Smoker    Types: E-cigarettes    Quit date: 11/01/2011  . Smokeless  tobacco: Never Used  . Alcohol use Yes     Comment: occasional wine cooler  . Drug use: No  . Sexual activity: Yes    Birth control/ protection: Surgical     Comment: hysterectomy   Other Topics Concern  . Not on file   Social History Narrative   Lives at home   Husband Shaun   Three daughters   Tries to exercise       Family History  Problem Relation Age of Onset  . Heart disease Father 840    "code blue"  . Alcohol abuse Father   . Diabetes Father   . Cancer Maternal Aunt     skin  . Breast cancer Maternal Aunt   . Heart disease Maternal Aunt     CHF  . Cancer Paternal Grandfather     bladder  . Heart disease Maternal Grandmother   . Congestive Heart Failure Maternal Grandmother   . Breast cancer Maternal Aunt   . Cancer Maternal Aunt     skin  . Nevi Mother   . Heart disease Paternal Grandmother   . Diabetes Other   . Heart disease Other     Review of Systems  Constitutional: Negative for chills, fever and weight loss.  HENT: Negative for congestion and hearing loss.   Eyes: Negative  for blurred vision and pain.  Respiratory: Negative for cough and shortness of breath.   Cardiovascular: Negative for chest pain and leg swelling.  Gastrointestinal: Negative for abdominal pain, constipation, diarrhea and heartburn.  Genitourinary: Negative for dysuria and frequency.  Musculoskeletal: Negative for falls, joint pain and myalgias.  Neurological: Negative for dizziness, seizures and headaches.  Psychiatric/Behavioral: Negative for depression. The patient is nervous/anxious. The patient does not have insomnia.        Unable to maintain focus, forgetful, irritable    BP 110/72 (BP Location: Right Arm, Patient Position: Sitting, Cuff Size: Normal)   Pulse 80   Temp 98.4 F (36.9 C) (Oral)   Resp 16   Ht 5\' 3"  (1.6 m)   Wt 155 lb (70.3 kg)   LMP 08/31/2016 (Exact Date)   SpO2 100%   BMI 27.46 kg/m   Physical Exam  Constitutional: She is oriented to person,  place, and time. She appears well-developed and well-nourished.  HENT:  Head: Normocephalic and atraumatic.  Right Ear: External ear normal.  Left Ear: External ear normal.  Mouth/Throat: Oropharynx is clear and moist.  Eyes: Conjunctivae are normal. Pupils are equal, round, and reactive to light.  Neck: Normal range of motion. Neck supple. No thyromegaly present.  Cardiovascular: Normal rate, regular rhythm and normal heart sounds.   Pulmonary/Chest: Effort normal and breath sounds normal. No respiratory distress.  Abdominal: Soft. Bowel sounds are normal. There is no tenderness.  Healed lowe abdominal scar  Musculoskeletal: Normal range of motion. She exhibits no edema.  Lymphadenopathy:    She has no cervical adenopathy.  Neurological: She is alert and oriented to person, place, and time.  Gait normal  Skin: Skin is warm and dry.  Psychiatric: Her speech is normal and behavior is normal. Thought content normal. Her mood appears anxious.  Hyperkinetic and fidgety  Nursing note and vitals reviewed.  ASSESSMENT/PLAN:   1. Family history of heart disease in female family member before age 53  - Lipid Profile  2. Nicotine dependence, uncomplicated, unspecified nicotine product type   3. Inattention Patient's husband has requested that she be "tested for ADD". She has trouble focusing and paying attention. She is easily agitated and irritable. She denies depression. Denies feeling anxious. States that she sleeps well. She states she did well in high school, but she chose "easy" classes. We spent some time discussing adult ADD. I'm unable to make this diagnosis especially with a normal high school career and good grades. - Ambulatory referral to Psychiatry  4. Encounter to establish care with new doctor    Patient Instructions  Continue to eat well Try to exercise daily Need to check a lipid panel - cholesterol  I am referring to a specialist for the ADD evaluation  See me  yearly     Eustace Moore, MD

## 2016-12-26 NOTE — Patient Instructions (Signed)
Continue to eat well Try to exercise daily Need to check a lipid panel - cholesterol  I am referring to a specialist for the ADD evaluation  See me yearly

## 2017-01-13 ENCOUNTER — Telehealth: Payer: Self-pay | Admitting: *Deleted

## 2017-01-13 DIAGNOSIS — F411 Generalized anxiety disorder: Secondary | ICD-10-CM

## 2017-01-13 NOTE — Telephone Encounter (Signed)
Refer psych 

## 2017-01-13 NOTE — Telephone Encounter (Signed)
Patient was seen in office last month to establish care, according to note, was supposed to be referred to behavioral health to be tested for ADD. Has not heard anything about an appt. I checked referrals and do not see it entered.

## 2017-01-16 NOTE — Addendum Note (Signed)
Addended by: Lily PeerNELSON, SUE on: 01/16/2017 05:24 PM   Modules accepted: Orders

## 2017-01-16 NOTE — Addendum Note (Signed)
Addended by: Crawford GivensPUTMAN, Ciearra Rufo M on: 01/16/2017 02:58 PM   Modules accepted: Orders

## 2017-01-24 ENCOUNTER — Telehealth: Payer: Self-pay

## 2017-01-24 DIAGNOSIS — F988 Other specified behavioral and emotional disorders with onset usually occurring in childhood and adolescence: Secondary | ICD-10-CM

## 2017-01-26 ENCOUNTER — Telehealth (HOSPITAL_COMMUNITY): Payer: Self-pay | Admitting: *Deleted

## 2017-01-26 NOTE — Telephone Encounter (Signed)
Patient cancelled, said the whole point of her coming was to get testing for ADHD.  She said it is no point in her coming if she can't get tested for ADHD.

## 2017-01-26 NOTE — Telephone Encounter (Signed)
Referral placed.

## 2017-02-06 ENCOUNTER — Encounter (HOSPITAL_COMMUNITY): Payer: Self-pay | Admitting: Psychiatry

## 2017-02-06 ENCOUNTER — Ambulatory Visit (INDEPENDENT_AMBULATORY_CARE_PROVIDER_SITE_OTHER): Payer: 59 | Admitting: Psychiatry

## 2017-02-06 VITALS — BP 117/73 | HR 84 | Ht 63.25 in | Wt 152.2 lb

## 2017-02-06 DIAGNOSIS — R41840 Attention and concentration deficit: Secondary | ICD-10-CM | POA: Diagnosis not present

## 2017-02-06 DIAGNOSIS — Z811 Family history of alcohol abuse and dependence: Secondary | ICD-10-CM

## 2017-02-06 DIAGNOSIS — Z79899 Other long term (current) drug therapy: Secondary | ICD-10-CM

## 2017-02-06 DIAGNOSIS — Z87891 Personal history of nicotine dependence: Secondary | ICD-10-CM | POA: Diagnosis not present

## 2017-02-06 DIAGNOSIS — F902 Attention-deficit hyperactivity disorder, combined type: Secondary | ICD-10-CM | POA: Insufficient documentation

## 2017-02-06 MED ORDER — BUPROPION HCL ER (XL) 150 MG PO TB24: 150.0000 mg | ORAL_TABLET | ORAL | 1 refills | Status: DC
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
## 2017-02-06 NOTE — Patient Instructions (Addendum)
1. Start wellbutirn 150 mg daily 2. Referral to psychological testing for ADHD 3. Return to clinic in one month

## 2017-02-15 ENCOUNTER — Ambulatory Visit (HOSPITAL_COMMUNITY): Payer: Self-pay | Admitting: Psychiatry

## 2017-03-02 ENCOUNTER — Encounter (HOSPITAL_COMMUNITY): Payer: Self-pay | Admitting: Psychiatry

## 2017-03-02 NOTE — Progress Notes (Deleted)
BH MD/PA/NP OP Progress Note  03/02/2017 8:40 AM Alexis White  MRN:  [id number]  Chief Complaint:  Subjective:  *** HPI: *** Visit Diagnosis: No diagnosis found.  Past Psychiatric History:  Outpatient: denies Psychiatry admission: denies  Previous suicide attempt: denies Past trials of medication: denies History of violence: denies  Past Medical History:  Past Medical History:  Diagnosis Date  . Absence seizure (HCC)   . Acute kidney failure following labor and delivery   . Acute liver failure   . Allergy   . Anemia   . Asthma   . Complication of anesthesia   . Depression   . HELLP syndrome in first trimester 2009  . HELLP syndrome in first trimester   . Kidney infection   . PONV (postoperative nausea and vomiting)   . Seizures (HCC)     Past Surgical History:  Procedure Laterality Date  . ABDOMINAL HYSTERECTOMY N/A 08/31/2016   Procedure: ABDOMINAL HYSTERECTOMY WITH REMOVAL OF CERVIX;  Surgeon: Lazaro Arms, MD;  Location: [facility];  Service: Gynecology;  Laterality: N/A;  . BREAST ENHANCEMENT SURGERY    . CESAREAN SECTION    . SCAR REVISION  08/31/2016   Procedure: REVISION OF ABDOMINAL WALL SCAR;  Surgeon: Lazaro Arms, MD;  Location: [facility];  Service: Gynecology;;  . TUBAL LIGATION      Family Psychiatric History:  Does not know, adopted,   Family History:  Family History  Problem Relation Age of Onset  . Heart disease Father 28    "code blue"  . Alcohol abuse Father   . Diabetes Father   . Cancer Maternal Aunt     skin  . Breast cancer Maternal Aunt   . Heart disease Maternal Aunt     CHF  . Cancer Paternal Grandfather     bladder  . Heart disease Maternal Grandmother   . Congestive Heart Failure Maternal Grandmother   . Breast cancer Maternal Aunt   . Cancer Maternal Aunt     skin  . Nevi Mother   . Heart disease Paternal Grandmother   . Diabetes Other   . Heart disease Other     Social History:  Social History   Social History  .  Marital status: Married    Spouse name: Shaun  . Number of children: 3  . Years of education: 42   Occupational History  . 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 Smoking status: Former Smoker    Types: E-cigarettes    Quit date: 11/01/2011  . Smokeless tobacco: Current User     Comment: 02-06-2017 PER PT [facility].  Marland Kitchen Alcohol use Yes     Comment: occasional wine cooler  . Drug use: No  . Sexual activity: Yes    Birth control/ protection: Surgical     Comment: hysterectomy   Other Topics Concern  . Not on file   Social History Narrative   Lives at home   Husband Shaun   Three daughters   Tries to exercise      Adopted at age 57, Grew up in [place], moved to [place] in 2012 for her husband's job. 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 Ciprofloxacin Hcl Other (See Comments)    Silent seizures.  . Coppertone Kids Spf15 [Albolene] Hives and Swelling  . Vicodin [Hydrocodone-Acetaminophen] Other (See Comments)    Makes her feel drunk and she prefers not to take this.    Metabolic Disorder Labs: No results found for: HGBA1C, MPG No results found for: PROLACTIN Lab Results  Component Value Date   CHOL 148 12/26/2016   TRIG 30 12/26/2016   HDL 50 (L) 12/26/2016   CHOLHDL 3.0 12/26/2016   VLDL 6 12/26/2016   LDLCALC 92 12/26/2016     Current Medications: Current Outpatient Prescriptions  Medication Sig Dispense Refill  . buPROPion (WELLBUTRIN XL) 150 MG 24 hr tablet Take 1 tablet (150 mg total) by mouth every morning. 30 tablet 1   No current facility-administered medications for this visit.     Neurologic: Headache: No Seizure: No Paresthesias: No  Musculoskeletal: Strength &  Muscle Tone: within normal limits Gait & Station: normal Patient leans: N/A  Psychiatric Specialty Exam: ROS  Last menstrual period 08/31/2016.There is no height or weight on file to calculate BMI.  General Appearance: Fairly Groomed  Eye Contact:  Good  Speech:  Clear and Coherent  Volume:  Normal  Mood:  {BHH MOOD:22306}  Affect:  {Affect (PAA):22687}  Thought Process:  Coherent and Goal Directed  Orientation:  Full (Time, Place, and Person)  Thought Content: Logical   Suicidal Thoughts:  {ST/HT (PAA):22692}  Homicidal Thoughts:  {ST/HT (PAA):22692}  Memory:  Immediate;   Good Recent;   Good Remote;   Good  Judgement:  {Judgement (PAA):22694}  Insight:  {Insight (PAA):22695}  Psychomotor Activity:  Normal  Concentration:  Concentration: Good and Attention Span: Good  Recall:  Good  Fund of Knowledge: Good  Language: Good  Akathisia:  No  Handed:  Right  AIMS (if indicated):  N/A  Assets:  Communication Skills Desire for Improvement  ADL's:  Intact  Cognition: WNL  Sleep:  ***   Assessment Alexis White is a 31 year old female with no known psychiatry history, who was referred for ADHD evaluation.   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 Although she reports history of inattention during childhood, she had relatively coped well and denies any special aids in classes. Her affect is tense during the entire interview and it is difficult to discern whether her stress plays role in her inattention, although she denies any mood symptoms. Will refer to neuropsych testing for ADHD. Will start bupropion to target inattention; discussed side effect of headache, seizure (she denies any history except she was on ciprofloxacin), insomnia and worsening anxiety. She will start this medication after discussion with her husband.     Plan 1. Start Wellbutrin 150 mg daily 2. Referral to psychological testing for ADHD (Per patient, her PCP referred to neuropsych testing; she will contact the clinic if this referral is not made) 3. Return to clinic in one month  The patient demonstrates the following risk factors for suicide: Chronic risk factors for suicide include: N/A. Acute risk factors for suicide include: N/A. Protective factors for this patient include: positive social support, responsibility to others (children, family), coping skills and hope for the future. Considering these factors, the overall suicide risk at this point appears to be low. Patient is appropriate for outpatient follow up.   Treatment Plan Summary: Plan as  above  Neysa Hottereina Mickel Schreur, MD 03/02/2017, 8:40 AM

## 2017-03-02 NOTE — Telephone Encounter (Signed)
This encounter was created in error - please disregard.

## 2017-03-06 ENCOUNTER — Ambulatory Visit (HOSPITAL_COMMUNITY): Payer: Self-pay | Admitting: Psychiatry

## 2017-03-29 ENCOUNTER — Telehealth (HOSPITAL_COMMUNITY): Payer: Self-pay | Admitting: *Deleted

## 2017-03-29 NOTE — Telephone Encounter (Signed)
phone call from patient, she said she has been waiting [facility]

## 2017-03-29 NOTE — Telephone Encounter (Signed)
Called ref office with Dr. Kieth Brightlyodenbough for ADHD testings. Spoke with Vonna KotykJay. Per Vonna KotykJay the person that looks at the ref before it gets to her is not in the office. Per Vonna KotykJay to call back at [phone] opt 2 and either ask for [facility] GraterMaggie or Misty StanleyLisa. Called pt and informed her with information and she verbalized understanding.

## 2017-04-04 NOTE — Telephone Encounter (Signed)
Called ref office to get status for ADHD testing [facility] Per Seward GraterMaggie, she will have the ref placed in the work flow for someone to call pt and they will call office when pt have been scheduled.

## 2017-04-04 NOTE — Telephone Encounter (Signed)
Called pt and informed her with [facility].

## 2017-05-11 ENCOUNTER — Encounter: Payer: Self-pay | Admitting: Psychology

## 2017-05-11 ENCOUNTER — Encounter: Payer: 59 | Attending: Psychology | Admitting: Psychology

## 2017-05-11 DIAGNOSIS — F988 Other specified behavioral and emotional disorders with onset usually occurring in childhood and adolescence: Secondary | ICD-10-CM | POA: Insufficient documentation

## 2017-05-11 NOTE — Progress Notes (Signed)
Neuropsychological Consultation   Patient:   Alexis White   DOB:   Mar 22, 1986  MR Number:  [id number]  Location:  [facility] PHYSICAL MEDICINE AND REHABILITATION [address], [place] [address] [id number] [place] [address] Dept: [phone]           Date of Service:   05/11/2017  Start Time:   10 AM End Time:   11 AM  Provider/Observer:  Arley Phenix, Psy.D.       Clinical Neuropsychologist       Billing Code/Service: [phone] 4 Units  Chief Complaint:    The patient was referred by Dr. Vanetta Shawl for neuropsychological evaluation regarding potential issues with attention deficit disorder without hyperactivity. The patient clearly describes issues with inattention and poor concentration. The patient was seeking possible interventions with concerns about adult residual attention deficit disorder and the evaluation for formal assessment was requested.  Reason for Service:  Alexis White is a 31 year old female who was referred for a neuropsychological evaluation due to lifelong issues with attention and focusing deficits. The patient reports that she has had attentional issues her whole life and as a child that her teachers would describe her as an "daydreamer". The patient reports that she has spoken with her mother recently and her mother reported that the teachers thought she might have attention deficit disorder and recommended that she be treated with Ritalin. However, the patient reports that her mother did not want her on any medications as a child as her mother had seen another relative use this medication with undesired side effects of a different child. The patient reports that as a child and young adult she always avoided classes where she felt that she would not do well. She reports that she was very distracted by any type of external noises. She reports that she remembers is going back into elementary school. The patient  denies any issues related to anxiety or depression. She does report one panic attack in the past. However, looking at this issue in depth this does not appear to that been a full-blown panic attack but more significant anxiety. 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 She had no feelings of impending doom or about possible death.  The patient describes her attentional issues as having to do with times where she has to read or study for any length of time. She reports that she will end up reading material overnight over and constantly has other ideas pop in her head and distractor. The patient reports that she has trouble helping her kids with her homework.  Current Status:  The patient describes issues with attention and concentration that have continued since childhood. The patient reports that her frustration with this and her husband's frustration with her symptoms have prompted her to seek possible treatment and intervention at this time.  Reliability of Information: Information is derived from review of available medical records and a 1 hour face-to-face clinical interview with the patient.  Behavioral Observation: Alexis White  presents as a 31 y.o.-year-old Right Caucasian Female who appeared her stated age. her dress was Appropriate and she was Well Groomed and her manners were Appropriate to the situation.  her participation was indicative of Appropriate behaviors.  There were not any physical disabilities noted.  she displayed an appropriate level of cooperation and motivation.     Interactions:    Active Appropriate  Attention:  within normal limits and attention span and concentration were age appropriate  Memory:   within normal limits; recent and remote memory intact  Visuo-spatial:  within normal limits  Speech (Volume):  normal  Speech:   normal;   Thought Process:  Coherent and Relevant  Though  Content:  WNL;   Orientation:   person, place, time/date and situation  Judgment:   Good  Planning:   Good  Affect:    Appropriate  Mood:    NA  Insight:   Good  Intelligence:   normal  Marital Status/Living: The patient was born [place]. She is married and has 3 children. She has an 61633-year-old daughter, a 29-year-old daughter, and a 34233-year-old daughter. She currently resides with her husband and children.  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  Substance Use:  No concerns of substance abuse are reported.    Education:   The patient has completed high school and has had some education specializing in massage therapy.  Medical History:   Past Medical History:  Diagnosis Date  . Absence seizure (HCC)   . Acute kidney failure following labor and delivery   . Acute liver failure   . Allergy   . Anemia   . Asthma   . Complication of anesthesia   . Depression   . HELLP syndrome in first trimester 2009  . HELLP syndrome in first trimester   . Kidney infection   . PONV (postoperative nausea and vomiting)   . Seizures (HCC)          Abuse/Trauma History: The patient does acknowledge some past traumatic experiences when she was a child. However, she denies any residual effects on her life relative to flashbacks, avoidance or other intrusive thinking about the subject or events.  Psychiatric History:  The patient denies any prior psychiatric history.  Family Med/Psych History:  Family History  Problem Relation Age of Onset  . Heart disease Father 3140       "code blue"  . Alcohol abuse Father   . Diabetes Father   . Cancer Maternal Aunt        skin  . Breast cancer Maternal Aunt   . Heart  disease Maternal Aunt        CHF  . Cancer Paternal Grandfather        bladder  . Heart disease Maternal Grandmother   . Congestive Heart Failure Maternal Grandmother   . Breast cancer Maternal Aunt   . Cancer Maternal Aunt        skin  . Nevi Mother   . Heart disease Paternal Grandmother   . Diabetes Other   . Heart disease Other     Risk of Suicide/Violence: virtually non-existent the patient denies any suicidal or homicidal ideation.  Impression/DX:  Alexis White is a 31 year old Caucasian female that was referred for neuropsychological evaluation focused on attention and concentration aspects. She appears to be of average to above average intellectual abilities. However, she describes a lifelong difficulty with maintaining attention and concentration. She describes it as having a mild to moderately deleterious effect on her academic efforts going up through high school. She reports regularly avoiding classes that she felt would be very challenging  to her ability to focus and study. The patient has continued to have difficulties with attentional issues into adulthood and she, her husband, and life situations led her to seek evaluation and treatment for these lifelong symptoms. The patient denies any indications of anxiety or depression but she does acknowledge some frustration.  Disposition/Plan:  We will set the patient up for formal assessment of her attentional abilities utilizing the comprehensive attention battery and the CAB  CPT  Diagnosis:    Attention deficit disorder (ADD) without hyperactivity         Electronically Signed   _______________________ Arley Phenix, Psy.D.

## 2017-06-08 ENCOUNTER — Encounter: Payer: 59 | Attending: Psychology | Admitting: Psychology

## 2017-06-08 DIAGNOSIS — F988 Other specified behavioral and emotional disorders with onset usually occurring in childhood and adolescence: Secondary | ICD-10-CM | POA: Diagnosis not present
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
## 2017-06-15 ENCOUNTER — Encounter: Payer: Self-pay | Admitting: Psychology

## 2017-06-15 ENCOUNTER — Encounter (HOSPITAL_BASED_OUTPATIENT_CLINIC_OR_DEPARTMENT_OTHER): Payer: 59 | Admitting: Psychology

## 2017-06-15 DIAGNOSIS — F988 Other specified behavioral and emotional disorders with onset usually occurring in childhood and adolescence: Secondary | ICD-10-CM | POA: Diagnosis not present

## 2017-06-15 NOTE — Progress Notes (Signed)
Today, the patient was administered the conference of attention battery as well as the CAB CPT measure. This assessment will be scored and interpreted on June 15, 2017. A formal report be written at this time. Today was a 2 hour appointment for administration of these measures.

## 2017-06-15 NOTE — Progress Notes (Signed)
Patient:  Alexis White   DOB: 11-05-1985  MR Number: [id number]  Location: [facility] FOR PAIN AND [facility] MEDICINE [facility] PHYSICAL MEDICINE AND REHABILITATION [address] [place] [id number] [place] [address] Dept: [phone]  Start: 2 PM End: 3 PM  Provider/Observer:     Hershal Coria PSYD  Chief Complaint:      Chief Complaint  Patient presents with  . ADHD    Reason For Service:     The patient was referred by Dr. Vanetta Shawl for neuropsychological evaluation regarding potential issues with attention deficit disorder without hyperactivity. The patient clearly describes issues with inattention and poor concentration. The patient was seeking possible interventions with concerns about adult residual attention deficit disorder and the evaluation for formal assessment was requested.  Alexis White is a 31 year old female who was referred for a neuropsychological evaluation due to lifelong issues with attention and focusing deficits. The patient reports that she has had attentional issues her whole life and as a child that her teachers would describe her as an "daydreamer". The patient reports that she has spoken with her mother recently and her mother reported that the teachers thought she might have attention deficit disorder and recommended that she be treated with Ritalin. However, the patient reports that her mother did not want her on any medications as a child as her mother had seen another relative use this medication with undesired side effects of a different child. The patient reports that as a child and young adult she always avoided classes where she felt that she would not do well. She reports that she was very distracted by any type of external noises. She reports that she remembers is going back into elementary school. The patient denies any issues related to anxiety or depression. She does report one panic attack in the past. However, looking  at this issue in depth this does not appear to that been a full-blown panic attack but more significant anxiety. 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 She had no feelings of impending doom or about possible death.  The patient describes her attentional issues as having to do with times where she has to read or study for any length of time. She reports that she will end up reading material overnight over and constantly has other ideas pop in her head and distractor. The patient reports that she has trouble helping her kids with her homework.  Testing Administered:  The patient completed the comprehensive attention battery as well as the CAB CPT measures. These are objective assessments of a broad range of multiple factors of attention and concentration. This battery of measures is designed to assess a multifactorial model of attention and concentration including focus execute abilities, auditory and visual encoding, cognitive shifting, divided attention, and sustained attention and concentration.  As far as the validity of this assessment, the patient did appear to put forth full effort and was diligent throughout this assessment. This does appear to be a valid assessment of her current neuropsychological functioning with regard to this broad range of various attention and concentration measures.  Participation Level:   Active  Participation Quality:  Appropriate      Behavioral Observation:  Well Groomed, Alert, and Appropriate.   Test Results:   Initially, the patient completed the auditory/visual reaction time test. On the pure visual reaction time measure the patient correctly identified 50 of 50 targets with 0 errors of  omission and 0 errors of commission. The patient's response time was 378 ms which is consistent with normative expectations. A similar pattern was found with the auditory pure reaction time measure. She correctly  responded to 49 of 50 targets with 1 errors of omission and 0 errors of commission. The patient's response time was 413 ms which is also consistent with normative expectations.  The patient then completed the discriminant reaction time measure. This is 3 separate tests with one being her pure visual tasks, the second being appear auditory task and the third being a mixed discriminate reaction time that requires cognitive shifting of attention. On the visual discriminate reaction time measure the patient had accuracy score of 35 of 35 targets. She had one error of commission and 0 errors of omission. Her response time was 454 ms which is consistent with normative expectations and indicative of efficient performance. On the auditory discriminate reaction time test the patient correctly identified 34 of 35 targets with 1 errors of commission and 1 errors of omission. Her average response time was 724 ms which is consistent with normative expectations. On the mixed discriminate reaction time, which is a measure that also requires cognitive shifting of attention as the target stimuli are changed throughout the measure, the patient's performance showed marked difficulties with regard to errors of commission. The patient correctly identified 28 of 30 targets and thus only had to errors of omission. However, she had 7 errors of commission that were likely indicative of lost set (inability to shift from one target to another). Her average response time was within normal limits but this pattern is indicative of difficulty shifting attention and concentration.   The patient was then administered the auditory/visual scan reaction time measure. On the visual scanning measure she correctly identified 39 of the 40 targets with only one error of omission and 0 errors of commission. Her average response time was 805 ms which did show some mild slowing and issues with difficulty shifting visual focus. On the auditory scan  reaction time measure her performance showed marked deficits. She correctly responded to 29 of the 40 targets and had 10 errors of commission and 3 errors of omission. Her performance is significantly impaired with regard to accuracy measures relative to an age-matched comparison group. Her average response time was 1233 ms which was within normal limits but the patient clearly had difficulty with multiprocessing. The patient did indicate some left right confusion which made her ability to multiprocessing this auditory information more problematic. On the mixed scan reaction time measure the patient continued to show deficits with regard to errors of commission. The patient had difficulty shifting between auditory and visual target stimuli. This performance again is consistent with deficits with regard to shifting attention and concentration.  The patient was then administered the auditory/visual encoding test. The patient showed mild deficits with regard to auditory encoding both forwards and backwards. This was a consistent finding indicative of difficulties taking in and retaining information for even a brief period of time. The patient showed significant deficits of more than one half standard deviation below normative expectation with regard to visual encoding. This was true for both visual encoding forwards as well as visual encoding backwards measures. This pattern is consistent with both auditory and visual encoding deficits.  The patient then completed the Stroop interference cancellation test. The patient completes for series of trials that are primarily measuring focus execute abilities and overall information processing speed along with visual scanning and searching. After  the first 4 trials of this measure the patient is then placed in a similar trial but with auditory distractors present better targeted to the material she is trying to process. On the first 4 noninterference trials the patient  showed some mild to moderate slowing of information processing speed but did have generally good accuracy performance without any indicative of significant impulsivity or inability to inhibit responding. However, under the interference trials the patient's performance showed significant deterioration and throughout this measure she was not able to recover or block out this targeted interference. The patient showed great distractibility and difficulty with dividing attention or focus execute abilities when targeted distractors are present.  The patient was then administered the visual monitor task, which is a continuous performance measure are looking at discriminate reaction time performance as a function of time. This measure assessed sustained attention and concentration. Is a 15 minute task that is broken down into 3 five minute blocks of time.  During the first 3 minutes of this task the patient correctly identified 29 of 30 targets with 0 errors of commission and 1 errors of omission. Her average response time was 615 ms which is within normative expectations. The patient showed a steady decrease in performance as a function of time. This deterioration became present and as little as 3-6 minutes of sustained attention. During the 3-6 minute mark she had 4 errors of omission which steadily increased to the point where she had 9 errors of omission by the end of this task for 3 minutes block of time. Her average response time increased throughout this measure going from 615 ms to 738 ms. This pattern is consistent with deficits related to an inability to sustain attention over time.  Summary of Results:   The results of the current neuropsychological assessment of a broad range of attention and concentration factors were consistent with significant deficits related to attention and concentration. This pattern is consistent with those typically found with individuals with adult residual attention deficit  disorder. This pattern was not consistent with individuals with significant anxiety disorders or significant depressive disorders related to these psychiatric conditions and how they affect attention and concentration. The patient showed significant deficits with regard to shifting attention, both auditory and visual encoding abilities, as well as deficits related to sustained attention. The patient was easily distracted by targeted external distractors. Overall speed of mental operations was generally within normal limits on focus execute abilities it lasted 3 minutes or less. However, when sustained effort was required her attention began to show significant deterioration in his little as 3 or 4 minutes of time. Again, this pattern is consistent with patterns typically seen with adult residual attention deficit disorder. The pattern is also consistent with those found with individuals who tend to have a positive response to psychostimulant medications. I do think she would be a good candidate for psychostimulant trials as long as other medical issues do not contradict indicate this effort.  Impression/Diagnosis:   The results of the current neuropsychological evaluation are consistent with those typically seen with individuals diagnosed with adult residual attention deficit disorder. The pattern of neuropsychological performance is consistent with individuals who typically respond well to psychostimulant medications. There are no indications of significant anxiety or depressive disorders. The patient did have what she described as a panic attack one time related to air travel but this does not appear to be a full-blown panic attack and she has had no such events beyond that. The patient denied any  significant issues related to depression. There are no indications of bipolar affective disorder or any other psychotic disorder.  Diagnosis:    Axis I: Attention deficit disorder (ADD) without  hyperactivity   Arley Phenix, Psy.D. Neuropsychologist

## 2017-06-20 ENCOUNTER — Ambulatory Visit (INDEPENDENT_AMBULATORY_CARE_PROVIDER_SITE_OTHER): Payer: 59 | Admitting: Psychiatry

## 2017-06-20 ENCOUNTER — Encounter (HOSPITAL_COMMUNITY): Payer: Self-pay | Admitting: Psychiatry

## 2017-06-20 VITALS — BP 107/67 | HR 95 | Ht 63.25 in | Wt 153.6 lb

## 2017-06-20 DIAGNOSIS — F902 Attention-deficit hyperactivity disorder, combined type: Secondary | ICD-10-CM

## 2017-06-20 DIAGNOSIS — Z87891 Personal history of nicotine dependence: Secondary | ICD-10-CM | POA: Diagnosis not present

## 2017-06-20 DIAGNOSIS — Z811 Family history of alcohol abuse and dependence: Secondary | ICD-10-CM | POA: Diagnosis not present

## 2017-06-20 DIAGNOSIS — Z566 Other physical and mental strain related to work: Secondary | ICD-10-CM | POA: Diagnosis not present

## 2017-06-20 MED ORDER — LISDEXAMFETAMINE DIMESYLATE 10 MG PO CAPS: 10.0000 mg | ORAL_CAPSULE | Freq: Every day | ORAL | 0 refills | Status: DC

## 2017-06-20 NOTE — Patient Instructions (Signed)
1. Start Vyvanse 10 mg daily 2. Return to clinic in one month

## 2017-06-23 ENCOUNTER — Telehealth (HOSPITAL_COMMUNITY): Payer: Self-pay | Admitting: *Deleted

## 2017-06-23 ENCOUNTER — Other Ambulatory Visit (HOSPITAL_COMMUNITY): Payer: Self-pay | Admitting: Psychiatry

## 2017-06-23 MED ORDER — LISDEXAMFETAMINE DIMESYLATE 30 MG PO CAPS: 30.0000 mg | ORAL_CAPSULE | Freq: Every day | ORAL | 0 refills | Status: DC

## 2017-06-23 NOTE — Telephone Encounter (Signed)
Discussed with patient. She would like to try higher dose. Will dispense Vyvanse 30 mg daily for 30 days. She will likely come to office on Monday to get this prescription. Please give the presctiprion to her.

## 2017-06-23 NOTE — Telephone Encounter (Signed)
Noted  

## 2017-06-23 NOTE — Telephone Encounter (Signed)
Pt called statng she started her Vyvanse 10 mg and it has done nothing. Per pt it's like she's taking an placebo. Per pt the only thing this medication has done is make her urinate a lot. Per pt she would like to know provider would like for her to do and for provider to call her back. [phone].

## 2017-06-26 ENCOUNTER — Telehealth (HOSPITAL_COMMUNITY): Payer: Self-pay

## 2017-06-26 NOTE — Telephone Encounter (Signed)
Medication refill - Telephone call with patient to inform her requested prescription refill for Vyvanse was prepared for pick up.  Pt. stated she would be in on 06/27/17 to pick up the new order.

## 2017-06-27 ENCOUNTER — Telehealth (HOSPITAL_COMMUNITY): Payer: Self-pay | Admitting: *Deleted

## 2017-06-27 NOTE — Telephone Encounter (Signed)
Pt came into office to pick up her Vyvanse 30 mg with ID number [id number] and order ID number is [id number]. Pt D.L number is [id number] with exp date of 06-19-2020. Pt number verbalized and showed understanding.

## 2017-06-27 NOTE — Telephone Encounter (Signed)
noted 
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
## 2017-07-19 ENCOUNTER — Ambulatory Visit (INDEPENDENT_AMBULATORY_CARE_PROVIDER_SITE_OTHER): Payer: 59 | Admitting: Psychiatry

## 2017-07-19 VITALS — BP 111/73 | HR 82 | Ht 60.0 in | Wt 146.2 lb

## 2017-07-19 DIAGNOSIS — Z87891 Personal history of nicotine dependence: Secondary | ICD-10-CM

## 2017-07-19 DIAGNOSIS — F902 Attention-deficit hyperactivity disorder, combined type: Secondary | ICD-10-CM

## 2017-07-19 DIAGNOSIS — Z811 Family history of alcohol abuse and dependence: Secondary | ICD-10-CM | POA: Diagnosis not present

## 2017-07-19 MED ORDER — LISDEXAMFETAMINE DIMESYLATE 30 MG PO CAPS: 30.0000 mg | ORAL_CAPSULE | Freq: Every day | ORAL | 0 refills | Status: DC

## 2017-07-19 NOTE — Patient Instructions (Addendum)
1. Continue Vyvanse 30 mg daily 2. Return to clinic in three months
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
## 2017-10-11 ENCOUNTER — Ambulatory Visit (HOSPITAL_COMMUNITY): Payer: 59 | Admitting: Psychiatry

## 2017-10-11 ENCOUNTER — Encounter (HOSPITAL_COMMUNITY): Payer: Self-pay | Admitting: Psychiatry

## 2017-10-11 VITALS — BP 108/70 | HR 116 | Ht 60.0 in | Wt 142.0 lb

## 2017-10-11 DIAGNOSIS — F902 Attention-deficit hyperactivity disorder, combined type: Secondary | ICD-10-CM | POA: Diagnosis not present

## 2017-10-11 DIAGNOSIS — R000 Tachycardia, unspecified: Secondary | ICD-10-CM | POA: Diagnosis not present

## 2017-10-11 DIAGNOSIS — Z87891 Personal history of nicotine dependence: Secondary | ICD-10-CM | POA: Diagnosis not present

## 2017-10-11 DIAGNOSIS — Z811 Family history of alcohol abuse and dependence: Secondary | ICD-10-CM

## 2017-10-11 MED ORDER — LISDEXAMFETAMINE DIMESYLATE 30 MG PO CAPS: 30.0000 mg | ORAL_CAPSULE | Freq: Every day | ORAL | 0 refills | Status: DC

## 2017-10-11 NOTE — Patient Instructions (Addendum)
1. Continue Vyvanse 30 mg  2. Return to clinic in three months for 15 mins

## 2017-11-07 ENCOUNTER — Ambulatory Visit (INDEPENDENT_AMBULATORY_CARE_PROVIDER_SITE_OTHER): Payer: 59 | Admitting: Obstetrics & Gynecology

## 2017-11-07 ENCOUNTER — Encounter: Payer: Self-pay | Admitting: Obstetrics & Gynecology

## 2017-11-07 VITALS — BP 118/60 | HR 84 | Ht 63.0 in | Wt 147.0 lb

## 2017-11-07 DIAGNOSIS — Z01419 Encounter for gynecological examination (general) (routine) without abnormal findings: Secondary | ICD-10-CM | POA: Diagnosis not present

## 2017-11-07 NOTE — Progress Notes (Signed)
Subjective:     Alexis White is a 32 y.o. female here for a routine exam.  Patient's last menstrual period was 08/31/2016 (exact date). [id number] Birth Control Method:  hysterectomy Menstrual Calendar(currently): amenorrheic  Current complaints: none.   Current acute medical issues:  none   Recent Gynecologic History Patient's last menstrual period was 08/31/2016 (exact date). Last Pap: 2017,  normal Last mammogram: ,    Past Medical History:  Diagnosis Date  . Absence seizure (HCC)   . Acute kidney failure following labor and delivery   . Acute liver failure   . Allergy   . Anemia   . Asthma   . Complication of anesthesia   . Depression   . HELLP syndrome in first trimester 2009  . HELLP syndrome in first trimester   . Kidney infection   . PONV (postoperative nausea and vomiting)   . Seizures (HCC)     Past Surgical History:  Procedure Laterality Date  . ABDOMINAL HYSTERECTOMY N/A 08/31/2016   Procedure: ABDOMINAL HYSTERECTOMY WITH REMOVAL OF CERVIX;  Surgeon: Lazaro Arms, MD;  Location: [facility];  Service: Gynecology;  Laterality: N/A;  . BREAST ENHANCEMENT SURGERY    . CESAREAN SECTION    . SCAR REVISION  08/31/2016   Procedure: REVISION OF ABDOMINAL WALL SCAR;  Surgeon: Lazaro Arms, MD;  Location: [facility];  Service: Gynecology;;  . TUBAL LIGATION      OB History    Gravida Para Term Preterm AB Living   4       1 3    SAB TAB Ectopic Multiple Live Births   1       3      Social History   Socioeconomic History  . Marital status: Married    Spouse name: Shaun  . Number of children: 3  . Years of education: 36  . Highest education level: None  Social Needs  . Financial resource strain: None  . Food insecurity - worry: None  . Food insecurity - inability: None  . Transportation needs - medical: None  . Transportation needs - non-medical: None  Occupational History  . Occupation: massage therapy    Comment: by schooling  . Occupation: Bake art- cakes     Comment: self  Tobacco Use  . Smoking status: Former Smoker    Types: E-cigarettes    Last attempt to quit: 11/01/2011    Years since quitting: 6.0  . Smokeless tobacco: Current User  . Tobacco comment: 02-06-2017 PER PT [facility].  Substance and Sexual Activity  . Alcohol use: Yes    Comment: occasional wine cooler  . Drug use: No  . Sexual activity: Yes    Birth control/protection: Surgical    Comment: hysterectomy  Other Topics Concern  . None  Social History Narrative   Lives at home   Husband Shaun   Three daughters   Tries to exercise    Family History  Adopted: Yes  Problem Relation Age of Onset  . Heart disease Father 68       "code blue"  . Alcohol abuse Father   . Diabetes Father   . Cancer Maternal Aunt        skin  . Breast cancer Maternal Aunt   . Heart disease Maternal Aunt        CHF  . Cancer Paternal Grandfather        bladder  . Heart disease Maternal Grandmother   . Congestive Heart Failure Maternal Grandmother   .  Breast cancer Maternal Aunt   . Cancer Maternal Aunt        skin  . Nevi Mother   . Heart disease Paternal Grandmother   . Diabetes Other   . Heart disease Other      Current Outpatient Medications:  .  doxycycline (VIBRAMYCIN) 100 MG capsule, , Disp: , Rfl:  .  lisdexamfetamine (VYVANSE) 30 MG capsule, Take 1 capsule (30 mg total) by mouth daily., Disp: 30 capsule, Rfl: 0  Review of Systems  Review of Systems  Constitutional: Negative for fever, chills, weight loss, malaise/fatigue and diaphoresis.  HENT: Negative for hearing loss, ear pain, nosebleeds, congestion, sore throat, neck pain, tinnitus and ear discharge.   Eyes: Negative for blurred vision, double vision, photophobia, pain, discharge and redness.  Respiratory: Negative for cough, hemoptysis, sputum production, shortness of breath, wheezing and stridor.   Cardiovascular: Negative for chest pain, palpitations, orthopnea, claudication, leg swelling and PND.   Gastrointestinal: negative for abdominal pain. Negative for heartburn, nausea, vomiting, diarrhea, constipation, blood in stool and melena.  Genitourinary: Negative for dysuria, urgency, frequency, hematuria and flank pain.  Musculoskeletal: Negative for myalgias, back pain, joint pain and falls.  Skin: Negative for itching and rash.  Neurological: Negative for dizziness, tingling, tremors, sensory change, speech change, focal weakness, seizures, loss of consciousness, weakness and headaches.  Endo/Heme/Allergies: Negative for environmental allergies and polydipsia. Does not bruise/bleed easily.  Psychiatric/Behavioral: Negative for depression, suicidal ideas, hallucinations, memory loss and substance abuse. The patient is not nervous/anxious and does not have insomnia.        Objective:  Blood pressure 118/60, pulse 84, height 5\' 3"  (1.6 m), weight 147 lb (66.7 kg), last menstrual period 08/31/2016.   Physical Exam  Vitals reviewed. Constitutional: She is oriented to person, place, and time. She appears well-developed and well-nourished.  HENT:  Head: Normocephalic and atraumatic.        Right Ear: External ear normal.  Left Ear: External ear normal.  Nose: Nose normal.  Mouth/Throat: Oropharynx is clear and moist.  Eyes: Conjunctivae and EOM are normal. Pupils are equal, round, and reactive to light. Right eye exhibits no discharge. Left eye exhibits no discharge. No scleral icterus.  Neck: Normal range of motion. Neck supple. No tracheal deviation present. No thyromegaly present.  Cardiovascular: Normal rate, regular rhythm, normal heart sounds and intact distal pulses.  Exam reveals no gallop and no friction rub.   No murmur heard. Respiratory: Effort normal and breath sounds normal. No respiratory distress. She has no wheezes. She has no rales. She exhibits no tenderness.  GI: Soft. Bowel sounds are normal. She exhibits no distension and no mass. There is no tenderness. There is no  rebound and no guarding.  Genitourinary:  Breasts no masses skin changes or nipple changes bilaterally, +implants bilaterally      Vulva is normal without lesions Vagina is pink moist without discharge Cervix absent Uterus is absent Adnexa is negative with normal sized ovaries   Musculoskeletal: Normal range of motion. She exhibits no edema and no tenderness.  Neurological: She is alert and oriented to person, place, and time. She has normal reflexes. She displays normal reflexes. No cranial nerve deficit. She exhibits normal muscle tone. Coordination normal.  Skin: Skin is warm and dry. No rash noted. No erythema. No pallor.  Psychiatric: She has a normal mood and affect. Her behavior is normal. Judgment and thought content normal.       Medications Ordered at today's visit: No orders of  the defined types were placed in this encounter.   Other orders placed at today's visit: No orders of the defined types were placed in this encounter.     Assessment:    Healthy female exam.    Plan:    Contraception: status post hysterectomy. Follow up in: 1 year.     Return in about 1 year (around 11/07/2018) for yearly, with Dr Despina HiddenEure.

## 2017-11-12 ENCOUNTER — Encounter (HOSPITAL_COMMUNITY): Payer: Self-pay | Admitting: Emergency Medicine

## 2017-11-12 ENCOUNTER — Emergency Department (HOSPITAL_COMMUNITY)
Admission: EM | Admit: 2017-11-12 | Discharge: 2017-11-12 | Disposition: A | Discharge status: Home / Self Care | Payer: 59 | Attending: Emergency Medicine | Admitting: Emergency Medicine

## 2017-11-12 ENCOUNTER — Other Ambulatory Visit: Payer: Self-pay

## 2017-11-12 DIAGNOSIS — J45909 Unspecified asthma, uncomplicated: Secondary | ICD-10-CM | POA: Insufficient documentation

## 2017-11-12 DIAGNOSIS — R6884 Jaw pain: Secondary | ICD-10-CM | POA: Diagnosis present

## 2017-11-12 DIAGNOSIS — Z87891 Personal history of nicotine dependence: Secondary | ICD-10-CM | POA: Insufficient documentation

## 2017-11-12 DIAGNOSIS — Z79899 Other long term (current) drug therapy: Secondary | ICD-10-CM | POA: Insufficient documentation

## 2017-11-12 DIAGNOSIS — K047 Periapical abscess without sinus: Secondary | ICD-10-CM | POA: Insufficient documentation

## 2017-11-12 MED ORDER — PREDNISONE 20 MG PO TABS: 40.0000 mg | ORAL_TABLET | Freq: Every day | ORAL | 0 refills | Status: DC

## 2017-11-12 MED ORDER — OXYCODONE-ACETAMINOPHEN 5-325 MG PO TABS
1.0000 | ORAL_TABLET | Freq: Once | ORAL | Status: AC
  Administered 2017-11-12: 1 via ORAL
  Filled 2017-11-12: qty 1

## 2017-11-12 MED ORDER — OXYCODONE-ACETAMINOPHEN 5-325 MG PO TABS: 1.0000 | ORAL_TABLET | ORAL | 0 refills | Status: DC | PRN

## 2017-11-12 MED ORDER — PREDNISONE 50 MG PO TABS
60.0000 mg | ORAL_TABLET | ORAL | Status: AC
  Administered 2017-11-12: 60 mg via ORAL
  Filled 2017-11-12: qty 1

## 2017-11-12 NOTE — Discharge Instructions (Signed)
In addition to the prescribed pain medication and anti-inflammatory steroid, please be sure to keep taking your antibiotic. Tomorrow, please be sure to contact your dentist for definitive therapy of your dental infection.  Return here for concerning changes in your condition.

## 2017-11-12 NOTE — ED Provider Notes (Signed)
[facility] EMERGENCY DEPARTMENT Provider Note   CSN: [id number] Arrival date & time: 11/12/17  1648     History   Chief Complaint Chief Complaint  Patient presents with  . Jaw Pain    HPI Arlan OrganDevin L Contino is a 32 y.o. female.  HPI Patient presents with concern of pain and swelling about her left jaw. Onset was about 5 days ago, and since onset she has had increasing discomfort about this area as well as swelling in the inferior jaw. She has a known dental issue in that area. Since onset patient is continue taking doxycycline, which is a typical medication for her due to acne. In addition, 2 days ago the patient spoke with a telemedicine provider, and was started on amoxicillin. No new fever, though she states that she has a hard time discerning this due to her persistent use of ibuprofen and Tylenol for pain control. These medications only take the edge off of the pain. No difficulty swallowing, breathing. She is generally well, denies other substantial medical problems. She is here with her husband who assists with the HPI.   Past Medical History:  Diagnosis Date  . Absence seizure (HCC)   . Acute kidney failure following labor and delivery   . Acute liver failure   . Allergy   . Anemia   . Asthma   . Complication of anesthesia   . Depression   . HELLP syndrome in first trimester 2009  . HELLP syndrome in first trimester   . Kidney infection   . PONV (postoperative nausea and vomiting)   . Seizures [facility](HCC)     Patient Active Problem List   Diagnosis Date Noted  . Attention deficit hyperactivity disorder (ADHD), combined type 02/06/2017  . Family history of heart disease in female family member before age 30 12/26/2016  . Nicotine dependence 12/26/2016  . Inattention 12/26/2016  . S/P hysterectomy 08/31/2016    Past Surgical History:  Procedure Laterality Date  . ABDOMINAL HYSTERECTOMY N/A 08/31/2016   Procedure: ABDOMINAL HYSTERECTOMY WITH REMOVAL OF CERVIX;   Surgeon: [name], MD;  Location: [facility];  Service: Gynecology;  Laterality: N/A;  . BREAST ENHANCEMENT SURGERY    . CESAREAN SECTION    . SCAR REVISION  08/31/2016   Procedure: REVISION OF ABDOMINAL WALL SCAR;  Surgeon: [name], MD;  Location: [facility];  Service: Gynecology;;  . TUBAL LIGATION      OB History    Gravida Para Term Preterm AB Living   4       1 3    SAB TAB Ectopic Multiple Live Births   1       3       Home Medications    Prior to Admission medications   Medication Sig Start Date End Date Taking? Authorizing Provider  doxycycline (VIBRAMYCIN) 100 MG capsule  09/06/17   [provider]  lisdexamfetamine (VYVANSE) 30 MG capsule Take 1 capsule (30 mg total) by mouth daily. 10/11/17   Neysa HotterHisada, Reina, MD  oxyCODONE-acetaminophen (PERCOCET/ROXICET) 5-325 MG tablet Take 1 tablet by mouth every 4 (four) hours as needed for severe pain. 11/12/17   Gerhard MunchLockwood, Aydrian Halpin, MD  predniSONE (DELTASONE) 20 MG tablet Take 2 tablets (40 mg total) by mouth daily with breakfast. For the next four days 11/12/17   Gerhard MunchLockwood, Keishana Klinger, MD    Family History Family History  Adopted: Yes  Problem Relation Age of Onset  . Heart disease Father 5540       "  code blue"  . Alcohol abuse Father   . Diabetes Father   . Cancer Maternal Aunt        skin  . Breast cancer Maternal Aunt   . Heart disease Maternal Aunt        CHF  . Cancer Paternal Grandfather        bladder  . Heart disease Maternal Grandmother   . Congestive Heart Failure Maternal Grandmother   . Breast cancer Maternal Aunt   . Cancer Maternal Aunt        skin  . Nevi Mother   . Heart disease Paternal Grandmother   . Diabetes Other   . Heart disease Other     Social History Social History   Tobacco Use  . Smoking status: Former Smoker    Types: E-cigarettes    Last attempt to quit: 11/01/2011    Years since quitting: 6.0  . Smokeless tobacco: Never Used  . Tobacco comment: 02-06-2017 PER PT [facility].  Substance  Use Topics  . Alcohol use: No    Frequency: Never  . Drug use: No     Allergies   Ciprofloxacin hcl; Coppertone kids spf15 [albolene]; and Vicodin [hydrocodone-acetaminophen]   Review of Systems Review of Systems  Constitutional:       Per HPI, otherwise negative  HENT:       Per HPI, otherwise negative  Respiratory:       Per HPI, otherwise negative  Cardiovascular:       Per HPI, otherwise negative  Gastrointestinal: Negative for vomiting.  Endocrine:       Negative aside from HPI  Genitourinary:       Neg aside from HPI   Musculoskeletal:       Per HPI, otherwise negative  Skin: Negative.   Neurological: Negative for syncope.     Physical Exam Updated Vital Signs BP (!) 129/92 (BP Location: Right Arm)   Pulse (!) 101   Temp (!) 97.5 F (36.4 C) (Oral)   Resp 15   Ht 5\' 3"  (1.6 m)   Wt 66.7 kg (147 lb)   LMP 08/31/2016 (Exact Date)   SpO2 100%   BMI 26.04 kg/m   Physical Exam  Constitutional: She is oriented to person, place, and time. She appears well-developed and well-nourished. No distress.  HENT:  Head: Normocephalic and atraumatic.  Mouth/Throat: Oropharynx is clear and moist.    Eyes: Conjunctivae and EOM are normal.  Neck: No tracheal deviation present. No thyromegaly present.  Cardiovascular: Normal rate and regular rhythm.  Pulmonary/Chest: Effort normal and breath sounds normal. No stridor. No respiratory distress.  Abdominal: She exhibits no distension.  Musculoskeletal: She exhibits no edema.  Lymphadenopathy:    She has no cervical adenopathy.  Neurological: She is alert and oriented to person, place, and time. No cranial nerve deficit.  Skin: Skin is warm and dry.  Psychiatric: She has a normal mood and affect.  Nursing note and vitals reviewed.    ED Treatments / Results   Procedures Procedures (including critical care time)  Medications Ordered in ED Medications  predniSONE (DELTASONE) tablet 60 mg (not administered)    oxyCODONE-acetaminophen (PERCOCET/ROXICET) 5-325 MG per tablet 1 tablet (not administered)     Initial Impression / Assessment and Plan / ED Course  I have reviewed the triage vital signs and the nursing notes.  Pertinent labs & imaging results that were available during my care of the patient were reviewed by me and considered in my medical decision  making (see chart for details).  This young female presents with jaw swelling and discomfort. No evidence for respiratory compromise, sepsis. Patient does have mild tachycardia, but no fever, no hypotension. Patient's lack of improvement following doxycycline use was likely due to the patient's chronic use of that medication. Patient advised to continue taking her newly prescribed antibiotic.  When she did receive additional analgesia, and anti-inflammatory here. After discussion with her and her husband, they will follow-up with her dentist tomorrow.   Final Clinical Impressions(s) / ED Diagnoses   Final diagnoses:  Dental infection    ED Discharge Orders        Ordered    predniSONE (DELTASONE) 20 MG tablet  Daily with breakfast     11/12/17 1855    oxyCODONE-acetaminophen (PERCOCET/ROXICET) 5-325 MG tablet  Every 4 hours PRN     11/12/17 Herbie Baltimore       Gerhard Munch, MD 11/12/17 1859

## 2017-11-12 NOTE — ED Triage Notes (Signed)
Patient c/o left lower jaw abscess that started Wednesday. Patient seen and started on Amox-clav 875mg  with no improvement. Patient taking tylenol and ibuprofen with no relief. Patient using ice but states swelling continues to increase. Denies any fevers. Denies any difficulty swallowing or breathing. Pain radiates into neck,l eft ear, and head. Last dose of tylenol at 11am and last dose of motrin at 5pm.

## 2017-11-12 NOTE — ED Notes (Signed)
ED Provider at bedside. 

## 2017-12-08 ENCOUNTER — Telehealth: Payer: Self-pay | Admitting: Obstetrics & Gynecology

## 2017-12-08 NOTE — Telephone Encounter (Addendum)
Patient states she is having "really bad ovarian cyst pain". She has been having dull pain for about 2 weeks, today she started having shooting pain on her left side. Spoke to Dr Despina HiddenEure and informed patient that if pain continued to call our office next week to have u/s and visit. Verbalized understanding.

## 2017-12-08 NOTE — Telephone Encounter (Signed)
Patient called stating that she has really bad ovarian cyst and that they are painful, pt states that she had surgery and she didn't have any pain until now. Please contact pt
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
## 2018-01-08 ENCOUNTER — Ambulatory Visit (HOSPITAL_COMMUNITY): Payer: 59 | Admitting: Psychiatry

## 2018-01-08 ENCOUNTER — Encounter (HOSPITAL_COMMUNITY): Payer: Self-pay | Admitting: Psychiatry

## 2018-01-08 VITALS — BP 118/84 | HR 87 | Ht 63.0 in | Wt 149.0 lb

## 2018-01-08 DIAGNOSIS — Z811 Family history of alcohol abuse and dependence: Secondary | ICD-10-CM | POA: Diagnosis not present

## 2018-01-08 DIAGNOSIS — R450 Nervousness: Secondary | ICD-10-CM

## 2018-01-08 DIAGNOSIS — F419 Anxiety disorder, unspecified: Secondary | ICD-10-CM

## 2018-01-08 DIAGNOSIS — R000 Tachycardia, unspecified: Secondary | ICD-10-CM

## 2018-01-08 DIAGNOSIS — F902 Attention-deficit hyperactivity disorder, combined type: Secondary | ICD-10-CM | POA: Diagnosis not present

## 2018-01-08 DIAGNOSIS — Z87891 Personal history of nicotine dependence: Secondary | ICD-10-CM | POA: Diagnosis not present

## 2018-01-08 MED ORDER — LISDEXAMFETAMINE DIMESYLATE 30 MG PO CAPS: 30.0000 mg | ORAL_CAPSULE | Freq: Every day | ORAL | 0 refills | Status: DC

## 2018-01-08 NOTE — Patient Instructions (Signed)
1. Continue Vyvanse 30 mg daily 2. Return to clinic in three months for 15 mins

## 2018-04-12 NOTE — Progress Notes (Signed)
BH MD/PA/NP OP Progress Note  04/17/2018 3:49 PM Alexis White  MRN:  [id number]  Chief Complaint:  Chief Complaint    Follow-up; ADHD     HPI:  The patient presents for follow-up appointment for ADHD. She brought her three daughters to the appointment. She states that she is doing well. She has been busy baking, sewing to help her friends. She also enjoys crochet. She denies any issues in doing multi tasking or completing tasks. She denies any concern. She is able to focus well on medication. She wants to be followed by PCP as it is more feasible. She denies feeling anxious or depressed. She denies insomnia. She denies headache, appetite loss.    Wt Readings from Last 3 Encounters:  04/17/18 147 lb (66.7 kg)  01/08/18 149 lb (67.6 kg)  11/12/17 147 lb (66.7 kg)    Per PMP,  vyvanse last filled on 03/22/2018   Visit Diagnosis:    ICD-10-CM   1. Attention deficit hyperactivity disorder (ADHD), combined type F90.2     Past Psychiatric History: Please see initial evaluation for full details. I have reviewed the history. No updates at this time.     Past Medical History:  Past Medical History:  Diagnosis Date  . Absence seizure (HCC)   . Acute kidney failure following labor and delivery   . Acute liver failure   . Allergy   . Anemia   . Asthma   . Complication of anesthesia   . Depression   . HELLP syndrome in first trimester 2009  . HELLP syndrome in first trimester   . Kidney infection   . PONV (postoperative nausea and vomiting)   . Seizures (HCC)     Past Surgical History:  Procedure Laterality Date  . ABDOMINAL HYSTERECTOMY N/A 08/31/2016   Procedure: ABDOMINAL HYSTERECTOMY WITH REMOVAL OF CERVIX;  Surgeon: Lazaro Arms, MD;  Location: [facility];  Service: Gynecology;  Laterality: N/A;  . BREAST ENHANCEMENT SURGERY    . CESAREAN SECTION    . SCAR REVISION  08/31/2016   Procedure: REVISION OF ABDOMINAL WALL SCAR;  Surgeon: Lazaro Arms, MD;  Location: [facility];   Service: Gynecology;;  . TUBAL LIGATION      Family Psychiatric History: Please see initial evaluation for full details. I have reviewed the history. No updates at this time.     Family History:  Family History  Adopted: Yes  Problem Relation Age of Onset  . Heart disease Father 79       "code blue"  . Alcohol abuse Father   . Diabetes Father   . Cancer Maternal Aunt        skin  . Breast cancer Maternal Aunt   . Heart disease Maternal Aunt        CHF  . Cancer Paternal Grandfather        bladder  . Heart disease Maternal Grandmother   . Congestive Heart Failure Maternal Grandmother   . Breast cancer Maternal Aunt   . Cancer Maternal Aunt        skin  . Nevi Mother   . Heart disease Paternal Grandmother   . Diabetes Other   . Heart disease Other     Social History:  Social History   Socioeconomic History  . Marital status: Married    Spouse name: Shaun  . Number of children: 3  . Years of education: 25  . Highest education level: Not on file  Occupational History  . Occupation:  massage therapy    Comment: by schooling  . Occupation: Games developerBake art- cakes    Comment: self  Social Needs  . Financial resource strain: Not on file  . Food insecurity:    Worry: Not on file    Inability: Not on file  . Transportation needs:    Medical: Not on file    Non-medical: Not on file  Tobacco Use  . Smoking status: Former Smoker    Types: E-cigarettes    Last attempt to quit: 11/01/2011    Years since quitting: 6.4  . Smokeless tobacco: Never Used  . Tobacco comment: 02-06-2017 PER PT [facility].  Substance and Sexual Activity  . Alcohol use: No    Frequency: Never  . Drug use: No  . Sexual activity: Yes    Birth control/protection: Surgical    Comment: hysterectomy  Lifestyle  . Physical activity:    Days per week: Not on file    Minutes per session: Not on file  . Stress: Not on file  Relationships  . Social connections:    Talks on phone: Not on file    Gets  together: Not on file    Attends religious service: Not on file    Active member of club or organization: Not on file    Attends meetings of clubs or organizations: Not on file    Relationship status: Not on file  Other Topics Concern  . Not on file  Social History Narrative   Lives at home   Husband Shaun   Three daughters   Tries to exercise    Allergies:  Allergies  Allergen Reactions  . Ciprofloxacin Hcl Other (See Comments)    Silent seizures.  . Coppertone Kids Spf15 [Albolene] Hives and Swelling  . Vicodin [Hydrocodone-Acetaminophen] Other (See Comments)    Makes her feel drunk and she prefers not to take this.    Metabolic Disorder Labs: No results found for: HGBA1C, MPG No results found for: PROLACTIN Lab Results  Component Value Date   CHOL 148 12/26/2016   TRIG 30 12/26/2016   HDL 50 (L) 12/26/2016   CHOLHDL 3.0 12/26/2016   VLDL 6 12/26/2016   LDLCALC 92 12/26/2016   No results found for: TSH  Therapeutic Level Labs: No results found for: LITHIUM No results found for: VALPROATE No components found for:  CBMZ  Current Medications: Current Outpatient Medications  Medication Sig Dispense Refill  . doxycycline (VIBRAMYCIN) 100 MG capsule     . [START ON 04/22/2018] lisdexamfetamine (VYVANSE) 30 MG capsule Take 1 capsule (30 mg total) by mouth daily before breakfast. 30 capsule 0  . [START ON 05/22/2018] lisdexamfetamine (VYVANSE) 30 MG capsule Take 1 capsule (30 mg total) by mouth daily before breakfast. 30 capsule 0  . [START ON 06/22/2018] lisdexamfetamine (VYVANSE) 30 MG capsule Take 1 capsule (30 mg total) by mouth daily before breakfast. 30 capsule 0   No current facility-administered medications for this visit.      Musculoskeletal: Strength & Muscle Tone: within normal limits Gait & Station: normal Patient leans: N/A  Psychiatric Specialty Exam: Review of Systems  Psychiatric/Behavioral: Negative for depression, hallucinations, memory loss,  substance abuse and suicidal ideas. The patient is not nervous/anxious and does not have insomnia.   All other systems reviewed and are negative.   Blood pressure 101/69, pulse (!) 103, height 5\' 3"  (1.6 m), weight 147 lb (66.7 kg), last menstrual period 08/31/2016, SpO2 100 %.Body mass index is 26.04 kg/m.  General Appearance: Fairly Groomed  Eye Contact:  Good  Speech:  Clear and Coherent  Volume:  Normal  Mood:  "good"  Affect:  Appropriate, Congruent and Full Range  Thought Process:  Coherent  Orientation:  Full (Time, Place, and Person)  Thought Content: Logical   Suicidal Thoughts:  No  Homicidal Thoughts:  No  Memory:  Immediate;   Good  Judgement:  Good  Insight:  Good  Psychomotor Activity:  Normal  Concentration:  Concentration: Good and Attention Span: Good  Recall:  Good  Fund of Knowledge: Good  Language: Good  Akathisia:  No  Handed:  Right  AIMS (if indicated): not done  Assets:  Communication Skills Desire for Improvement  ADL's:  Intact  Cognition: WNL  Sleep:  Good   Screenings:   Assessment and Plan:  Alexis White is a 32 y.o. year old female with a history of ADHD , who presents for follow up appointment for Attention deficit hyperactivity disorder (ADHD), combined type  # ADHD (diagnosed with neuropsych test 05/2017) Patient has good benefit from Vyvanse after starting medication.  Will continue current dose of Vyvanse to target ADHD symptoms.  Discussed risk of headache, insomnia, appetite loss, worsening anxiety, palpitation and hypertension. She has no known history of seizure except when she was on ciprofloxacin. Given her condition is stable, she will be followed by her PCP.   Plan I have reviewed and updated plans as below 1. Continue Vyvanse 30 mg daily(will do refill today given the patient goes to [place] in a few days) 2. Return to clinic as needed   The patient demonstrates the following risk factors for suicide: Chronic risk factors for  suicide include: N/A. Acute risk factorsfor suicide include: N/A. Protective factorsfor this patient include: positive social support, responsibility to others (children, family), coping skills and hope for the future. Considering these factors, the overall suicide risk at this point appears to be low. Patient isappropriate for outpatient follow up.    [name], MD 04/17/2018, 3:49 PM

## 2018-04-17 ENCOUNTER — Encounter (HOSPITAL_COMMUNITY): Payer: Self-pay | Admitting: Psychiatry

## 2018-04-17 ENCOUNTER — Ambulatory Visit (HOSPITAL_COMMUNITY): Payer: 59 | Admitting: Psychiatry

## 2018-04-17 VITALS — BP 101/69 | HR 103 | Ht 63.0 in | Wt 147.0 lb

## 2018-04-17 DIAGNOSIS — F902 Attention-deficit hyperactivity disorder, combined type: Secondary | ICD-10-CM

## 2018-04-17 MED ORDER — LISDEXAMFETAMINE DIMESYLATE 30 MG PO CAPS: 30.0000 mg | ORAL_CAPSULE | Freq: Every day | ORAL | 0 refills | Status: DC

## 2018-04-17 NOTE — Patient Instructions (Signed)
1. Continue Vyvanse 30 mg daily 2. Return to clinic as needed

## 2018-06-18 ENCOUNTER — Telehealth (HOSPITAL_COMMUNITY): Payer: Self-pay

## 2018-06-18 NOTE — Telephone Encounter (Signed)
I do not think I can order across the state because it is controlled substance. Alternatively, we can have her refill the medication sooner (if she is still in [place]).

## 2018-06-18 NOTE — Telephone Encounter (Signed)
This patient called wanting to know can [facility] [place] also can she pick it up on 06-21-18 as oppose to 06-22-18. Please advise

## 2018-06-19 NOTE — Telephone Encounter (Signed)
She will leaving late Thursday evening 8[place]22 heading to [place] & [facility]  Both trips are extended to allow time to spend with her family. Rx said insurance may not cover--to early? Reason why she asked to be sent to local Rx where's going.

## 2018-06-19 NOTE — Telephone Encounter (Signed)
I will not be able to prescribe medication then. Remind her that it is generally safe to discontinue Vyvanse if she were to run out of this medication.

## 2018-06-19 NOTE — Telephone Encounter (Signed)
Dr Vanetta ShawlHisada Patient called & she is already in [place]. She's not in [place]

## 2018-06-19 NOTE — Telephone Encounter (Signed)
Please discuss with the patient that I will not be able to prescribe controlled substance across the state. It is whether she gets early refill (if she prefers this, please contact the pharmacy to allow this) in [place] or she may need to be off medication if she prefers not to get this refill.

## 2018-06-19 NOTE — Telephone Encounter (Signed)
I will not be able to prescribe medication then. Remind her that it is generally safe to discnotinue

## 2018-07-04 ENCOUNTER — Other Ambulatory Visit (HOSPITAL_COMMUNITY): Payer: Self-pay | Admitting: Psychiatry

## 2018-07-04 ENCOUNTER — Other Ambulatory Visit (HOSPITAL_COMMUNITY): Payer: Self-pay | Admitting: *Deleted

## 2018-07-04 ENCOUNTER — Telehealth (HOSPITAL_COMMUNITY): Payer: Self-pay | Admitting: *Deleted

## 2018-07-04 MED ORDER — LISDEXAMFETAMINE DIMESYLATE 30 MG PO CAPS: 30.0000 mg | ORAL_CAPSULE | Freq: Every day | ORAL | 0 refills | Status: DC

## 2018-07-04 NOTE — Telephone Encounter (Signed)
done

## 2018-07-04 NOTE — Telephone Encounter (Signed)
Dr Vanetta Shawl Rx stated that you can send a new script stating that earlier refill for 07/20/18 is allowed sister wedding. And that they could place on file until then .

## 2018-07-04 NOTE — Telephone Encounter (Signed)
Because of DEA license is specific to the state, medication cannot be dispensed outside of [place]. We can ask local pharmacy to fill vyvanse sooner given the circumstance. Ask if she would like to try this route.

## 2018-07-04 NOTE — Telephone Encounter (Signed)
Dr Vanetta Shawl Patient called & last time you were unable to send Rx for her  Vy[place]se to [place]. Patient will be heading back for the Wedding in 2 weeks & the Rx in [place]. Told her that they could accept a  Paper script to be filled when she arrives. She's asking if she could have the paper script to take & have her Med's while in [place] for her sister's wedding?

## 2018-07-23 ENCOUNTER — Telehealth (HOSPITAL_COMMUNITY): Payer: Self-pay | Admitting: *Deleted

## 2018-07-23 NOTE — Telephone Encounter (Signed)
Dr Vanetta ShawlHisada Patient LVM stating that [facility] will not fill last Adderall earlier than 07/26/18.  Informed her that a new script could be sent in & that way she could get med earlier.  [place] going [place] And she leaving this afternoon.

## 2018-07-23 NOTE — Telephone Encounter (Signed)
Called Rx Gave V.O. To refill medication early as previously discussed with another pharmacist. Patient going out of [place] to [place]

## 2018-07-23 NOTE — Telephone Encounter (Signed)
I believe we have discussed this a few weeks ago so that she can receive refill sooner than expected. Please contact the pharmacy and advise to dispense the medication.

## 2018-08-20 ENCOUNTER — Other Ambulatory Visit (HOSPITAL_COMMUNITY): Payer: Self-pay | Admitting: Psychiatry

## 2018-08-20 ENCOUNTER — Telehealth (HOSPITAL_COMMUNITY): Payer: Self-pay | Admitting: *Deleted

## 2018-08-20 MED ORDER — LISDEXAMFETAMINE DIMESYLATE 30 MG PO CAPS: 30.0000 mg | ORAL_CAPSULE | Freq: Every day | ORAL | 0 refills | Status: DC

## 2018-08-20 NOTE — Telephone Encounter (Signed)
Dr Vanetta Shawl Patient is Requesting refills on med's . Rx will not send request for med's with no more authorize refills. Patient will be out of medication this week  & next office visit is 09-04-18

## 2018-08-20 NOTE — Telephone Encounter (Signed)
Ordered. Please notify the patient that I would not be able to do any more refill without evaluation.

## 2018-09-03 NOTE — Progress Notes (Signed)
BH MD/PA/NP OP Progress Note  09/04/2018 4:52 PM BRYAN OMURA  MRN:  [id number]  Chief Complaint:  Chief Complaint    Follow-up; ADHD     HPI:  Patient presents for follow-up appointment for ADHD.  She states that she has been doing well.  She visited [place] to attend her sister's wedding.  It went well, although she was stressed organizing things.  She has good attention, and denies any difficulty in concentration.  She has chronically decreased appetite, although she is not bothered by it.  She notices that she has xerostomia.  She feels fine as long as she drinks water.  She does not want to change her medication as she knows that the current dose works well for her concentration.  She denies insomnia.  She denies feeling depressed or anxious.  She denies panic attacks.   Wt Readings from Last 3 Encounters:  09/04/18 146 lb (66.2 kg)  04/17/18 147 lb (66.7 kg)  01/08/18 149 lb (67.6 kg)    Per PMP,  vyvanse filled on 08/22/2018   Visit Diagnosis:    ICD-10-CM   1. Attention deficit hyperactivity disorder (ADHD), combined type F90.2     Past Psychiatric History: Please see initial evaluation for full details. I have reviewed the history. No updates at this time.     Past Medical History:  Past Medical History:  Diagnosis Date  . Absence seizure (HCC)   . Acute kidney failure following labor and delivery   . Acute liver failure   . Allergy   . Anemia   . Asthma   . Complication of anesthesia   . Depression   . HELLP syndrome in first trimester 2009  . HELLP syndrome in first trimester   . Kidney infection   . PONV (postoperative nausea and vomiting)   . Seizures (HCC)     Past Surgical History:  Procedure Laterality Date  . ABDOMINAL HYSTERECTOMY N/A 08/31/2016   Procedure: ABDOMINAL HYSTERECTOMY WITH REMOVAL OF CERVIX;  Surgeon: Lazaro Arms, MD;  Location: [facility];  Service: Gynecology;  Laterality: N/A;  . BREAST ENHANCEMENT SURGERY    . CESAREAN SECTION     . SCAR REVISION  08/31/2016   Procedure: REVISION OF ABDOMINAL WALL SCAR;  Surgeon: Lazaro Arms, MD;  Location: [facility];  Service: Gynecology;;  . TUBAL LIGATION      Family Psychiatric History: Please see initial evaluation for full details. I have reviewed the history. No updates at this time.     Family History:  Family History  Adopted: Yes  Problem Relation Age of Onset  . Heart disease Father 70       "code blue"  . Alcohol abuse Father   . Diabetes Father   . Cancer Maternal Aunt        skin  . Breast cancer Maternal Aunt   . Heart disease Maternal Aunt        CHF  . Cancer Paternal Grandfather        bladder  . Heart disease Maternal Grandmother   . Congestive Heart Failure Maternal Grandmother   . Breast cancer Maternal Aunt   . Cancer Maternal Aunt        skin  . Nevi Mother   . Heart disease Paternal Grandmother   . Diabetes Other   . Heart disease Other     Social History:  Social History   Socioeconomic History  . Marital status: Married    Spouse name: Shaun  .  Number of children: 3  . Years of education: 34  . Highest education level: Not on file  Occupational History  . Occupation: massage therapy    Comment: by schooling  . Occupation: Games developer- cakes    Comment: self  Social Needs  . Financial resource strain: Not on file  . Food insecurity:    Worry: Not on file    Inability: Not on file  . Transportation needs:    Medical: Not on file    Non-medical: Not on file  Tobacco Use  . Smoking status: Former Smoker    Types: E-cigarettes    Last attempt to quit: 11/01/2011    Years since quitting: 6.8  . Smokeless tobacco: Never Used  . Tobacco comment: 02-06-2017 PER [facility].  Substance and Sexual Activity  . Alcohol use: No    Frequency: Never  . Drug use: No  . Sexual activity: Yes    Birth control/protection: Surgical    Comment: hysterectomy  Lifestyle  . Physical activity:    Days per week: Not on file    Minutes per  session: Not on file  . Stress: Not on file  Relationships  . Social connections:    Talks on phone: Not on file    Gets together: Not on file    Attends religious service: Not on file    Active member of club or organization: Not on file    Attends meetings of clubs or organizations: Not on file    Relationship status: Not on file  Other Topics Concern  . Not on file  Social History Narrative   Lives at home   Husband Shaun   Three daughters   Tries to exercise    Allergies:  Allergies  Allergen Reactions  . Ciprofloxacin Hcl Other (See Comments)    Silent seizures.  . Coppertone Kids Spf15 [Albolene] Hives and Swelling  . Vicodin [Hydrocodone-Acetaminophen] Other (See Comments)    Makes her feel drunk and she prefers not to take this.    Metabolic Disorder Labs: No results found for: HGBA1C, MPG No results found for: PROLACTIN Lab Results  Component Value Date   CHOL 148 12/26/2016   TRIG 30 12/26/2016   HDL 50 (L) 12/26/2016   CHOLHDL 3.0 12/26/2016   VLDL 6 12/26/2016   LDLCALC 92 12/26/2016   No results found for: TSH  Therapeutic Level Labs: No results found for: LITHIUM No results found for: VALPROATE No components found for:  CBMZ  Current Medications: Current Outpatient Medications  Medication Sig Dispense Refill  . doxycycline (VIBRAMYCIN) 100 MG capsule     . [START ON 09/22/2018] lisdexamfetamine (VYVANSE) 30 MG capsule Take 1 capsule (30 mg total) by mouth daily before breakfast. 30 capsule 0  . [START ON 10/22/2018] lisdexamfetamine (VYVANSE) 30 MG capsule Take 1 capsule (30 mg total) by mouth daily before breakfast. 30 capsule 0  . [START ON 10/21/2018] lisdexamfetamine (VYVANSE) 30 MG capsule Take 1 capsule (30 mg total) by mouth daily before breakfast. 30 capsule 0   No current facility-administered medications for this visit.      Musculoskeletal: Strength & Muscle Tone: within normal limits Gait & Station: normal Patient leans:  N/A  Psychiatric Specialty Exam: Review of Systems  Psychiatric/Behavioral: Negative for depression, hallucinations, memory loss, substance abuse and suicidal ideas. The patient is not nervous/anxious and does not have insomnia.   All other systems reviewed and are negative.   Blood pressure 121/82, pulse (!) 110, height 5\' 3"  (  1.6 m), weight 146 lb (66.2 kg), last menstrual period 08/31/2016, SpO2 100 %.Body mass index is 25.86 kg/m.  General Appearance: Fairly Groomed  Eye Contact:  Good  Speech:  Clear and Coherent  Volume:  Normal  Mood:  "good"  Affect:  Appropriate, Congruent and Full Range  Thought Process:  Coherent  Orientation:  Full (Time, Place, and Person)  Thought Content: Logical   Suicidal Thoughts:  No  Homicidal Thoughts:  No  Memory:  Immediate;   Good  Judgement:  Good  Insight:  Good  Psychomotor Activity:  Normal  Concentration:  Concentration: Good and Attention Span: Good  Recall:  Good  Fund of Knowledge: Good  Language: Good  Akathisia:  No  Handed:  Right  AIMS (if indicated): not done  Assets:  Communication Skills Desire for Improvement  ADL's:  Intact  Cognition: WNL  Sleep:  Good   Screenings:   Assessment and Plan:  Alexis White is a 32 y.o. year old female with a history of ADHD , who presents for follow up appointment for Attention deficit hyperactivity disorder (ADHD), combined type  # ADHD (diagnosed with neuropsych test 05/2017) Patient has responded well to Vyvanse without significant adverse reaction.  Will continue current dose of Vyvanse to target ADHD symptoms.  Discussed risk of headache, insomnia, appetite loss, worsening anxiety, palpitation and hypertension.  She has no known history of seizure except when she was on ciprofloxacin.  Noted that patient reports xerostomia, although she is not interested in switching her medication.  Will continue to monitor.   Plan I have reviewed and updated plans as below 1. Continue  Vyvanse 30 mg daily 2. Return to clinic in three months for 15 mins   The patient demonstrates the following risk factors for suicide: Chronic risk factors for suicide include: N/A. Acute risk factorsfor suicide include: N/A. Protective factorsfor this patient include: positive social support, responsibility to others (children, family), coping skills and hope for the future. Considering these factors, the overall suicide risk at this point appears to be low. Patient isappropriate for outpatient follow up.   [name], MD 09/04/2018, 4:52 PM

## 2018-09-04 ENCOUNTER — Ambulatory Visit (INDEPENDENT_AMBULATORY_CARE_PROVIDER_SITE_OTHER): Payer: 59 | Admitting: Psychiatry

## 2018-09-04 ENCOUNTER — Encounter (HOSPITAL_COMMUNITY): Payer: Self-pay | Admitting: Psychiatry

## 2018-09-04 VITALS — BP 121/82 | HR 110 | Ht 63.0 in | Wt 146.0 lb

## 2018-09-04 DIAGNOSIS — F902 Attention-deficit hyperactivity disorder, combined type: Secondary | ICD-10-CM

## 2018-09-04 MED ORDER — LISDEXAMFETAMINE DIMESYLATE 30 MG PO CAPS: 30.0000 mg | ORAL_CAPSULE | Freq: Every day | ORAL | 0 refills | Status: DC

## 2018-09-04 NOTE — Patient Instructions (Signed)
1. Continue Vyvanse 30 mg daily 2. Return to clinic in three months for 15 mins   1. Continue Vyvanse 30 mg daily 2. Return to clinic in three months for 15 mins

## 2018-11-21 ENCOUNTER — Telehealth (HOSPITAL_COMMUNITY): Payer: Self-pay | Admitting: *Deleted

## 2018-11-21 ENCOUNTER — Other Ambulatory Visit (HOSPITAL_COMMUNITY): Payer: Self-pay | Admitting: Psychiatry

## 2018-11-21 MED ORDER — AMPHETAMINE-DEXTROAMPHETAMINE 5 MG PO TABS: ORAL_TABLET | ORAL | 0 refills | Status: DC

## 2018-11-21 NOTE — Telephone Encounter (Signed)
Dr Vanetta Shawl Patient stated she doen't want to be on Ritalin . And that her insurance covers Methylphenidate for her daughter ( So yes it's covered). BUT wants to be "on a med that's as close to Vyvanse. Because it has been working really well.

## 2018-11-21 NOTE — Telephone Encounter (Signed)
Per [employer]: [employer] (company husband works for) makes Adderall & will cost $11 Vs. $55 monthly co-pay for Concerta.

## 2018-11-21 NOTE — Telephone Encounter (Signed)
Dr Vanetta Shawl Patient called & stated as 10/31/2018 her insurance no longer covers Vyvanse on their formulary. But they told he rthe do cover Adderall. Patient requesting Adderall for insurance purposes

## 2018-11-21 NOTE — Telephone Encounter (Signed)
Could you ask their insurance company if Concerta, or Ritalin (methylphenidate) be covered?

## 2018-11-21 NOTE — Telephone Encounter (Signed)
Ordered Adderall. Please instruct the patient to take 5 mg in the morning, and 5 mg at noon (do not take later in the afternoon to avoid insomnia). If the dose is not enough after trial of 7 days, she can increase to Adderal 10 mg in the morning, and 10 mg at noon.

## 2018-11-21 NOTE — Telephone Encounter (Signed)
Could you ask the insurance (not the patient) if Concerta is covered? I believe it is better option for the patient as it is long acting

## 2018-11-22 NOTE — Telephone Encounter (Signed)
Dr Vanetta Shawl Patient LVM stating that she didn't pick up the Adderall BECAUSE she wants the equilviant of the Vyvanse doseage she was previously on. She said she didn't accept the Adderall because 5 mg will not help her & since she was on Vyvanse  30 mg she would like her Adderall to be a 30 mg dosage. Asking for a call # [phone]

## 2018-11-22 NOTE — Telephone Encounter (Signed)
I do acknowledge that we are starting from lower dose. That is why I have also recommended uptitration after a week (if she does not have any side effect from 5 mg twice a day, she can try 10 mg twice a day after three days). We would like to start from lower dose to avoid potential side effect as people can respond to medication in a different way. Please ask the patient to first start from lower dose.

## 2018-11-26 ENCOUNTER — Telehealth (HOSPITAL_COMMUNITY): Payer: Self-pay | Admitting: *Deleted

## 2018-11-26 NOTE — Telephone Encounter (Signed)
Patient advise to go ahead with the  10 mg increase 2 x daily

## 2018-11-26 NOTE — Telephone Encounter (Signed)
Has she already tried Adderall 10 mg twice a day? If not, please advise her to do so. If she has already tried it and has no benefit, she can try Adderall 15 mg in the morning, 10 mg at noon. Will not do any more uptitration until the next visit.

## 2018-11-26 NOTE — Telephone Encounter (Signed)
Dr Vanetta Shawl Patient called  & stated she's been taking the Adderall for a few days with 0 side effects & 0 benefits. Feels like she's not taking anything @ all. Not helping

## 2018-11-27 NOTE — Progress Notes (Signed)
BH MD/PA/NP OP Progress Note  12/03/2018 9:10 AM Alexis White  MRN:  [id number]  Chief Complaint:  Chief Complaint    Follow-up; ADHD     HPI:  - vyvanse 30 mg was switched to adderall 5 mg BID, which was uptitrated to 15 mg in AM, 10 mg at noon Patient presents for follow-up appointment for ADHD.  She states that she has significant fatigue before she takes the next dose of Adderall.  She takes morning dose around 6 AM, and the second dose around 1 PM.  She also does not like the fact that she needs to take twice a day rather than once daily.  Although she denies any side effect from the medication, she prefers to stay on extended release, which she found that her insurance would cover.  She has good attention when she is on medication.  She has been working on her diet while working for [employer] .  She has not changed exercise as much, referring to her back pain, and joint pain due to deformity.  She feels frustrated that it is expensive to join the gym.  She sleeps well.  She denies feeling depressed or anxiety.  She denies SI.   Visit Diagnosis:    ICD-10-CM   1. Attention deficit hyperactivity disorder (ADHD), combined type F90.2     Past Psychiatric History: Please see initial evaluation for full details. I have reviewed the history. No updates at this time.     Past Medical History:  Past Medical History:  Diagnosis Date  . Absence seizure (HCC)   . Acute kidney failure following labor and delivery   . Acute liver failure   . Allergy   . Anemia   . Asthma   . Complication of anesthesia   . Depression   . HELLP syndrome in first trimester 2009  . HELLP syndrome in first trimester   . Kidney infection   . PONV (postoperative nausea and vomiting)   . Seizures (HCC)     Past Surgical History:  Procedure Laterality Date  . ABDOMINAL HYSTERECTOMY N/A 08/31/2016   Procedure: ABDOMINAL HYSTERECTOMY WITH REMOVAL OF CERVIX;  Surgeon: Lazaro Arms, MD;  Location: [facility];  Service:  Gynecology;  Laterality: N/A;  . BREAST ENHANCEMENT SURGERY    . CESAREAN SECTION    . SCAR REVISION  08/31/2016   Procedure: REVISION OF ABDOMINAL WALL SCAR;  Surgeon: Lazaro Arms, MD;  Location: [facility];  Service: Gynecology;;  . TUBAL LIGATION      Family Psychiatric History: Please see initial evaluation for full details. I have reviewed the history. No updates at this time.     Family History:  Family History  Adopted: Yes  Problem Relation Age of Onset  . Heart disease Father 63       "code blue"  . Alcohol abuse Father   . Diabetes Father   . Cancer Maternal Aunt        skin  . Breast cancer Maternal Aunt   . Heart disease Maternal Aunt        CHF  . Cancer Paternal Grandfather        bladder  . Heart disease Maternal Grandmother   . Congestive Heart Failure Maternal Grandmother   . Breast cancer Maternal Aunt   . Cancer Maternal Aunt        skin  . Nevi Mother   . Heart disease Paternal Grandmother   . Diabetes Other   . Heart disease Other  Social History:  Social History   Socioeconomic History  . Marital status: Married    Spouse name: Shaun  . Number of children: 3  . Years of education: 2313  . Highest education level: Not on file  Occupational History  . Occupation: massage therapy    Comment: by schooling  . Occupation: Games developerBake art- cakes    Comment: self  Social Needs  . Financial resource strain: Not on file  . Food insecurity:    Worry: Not on file    Inability: Not on file  . Transportation needs:    Medical: Not on file    Non-medical: Not on file  Tobacco Use  . Smoking status: Former Smoker    Types: E-cigarettes    Last attempt to quit: 11/01/2011    Years since quitting: 7.0  . Smokeless tobacco: Never Used  . Tobacco comment: 02-06-2017 PER [facility].  Substance and Sexual Activity  . Alcohol use: No    Frequency: Never  . Drug use: No  . Sexual activity: Yes    Birth control/protection: Surgical    Comment: hysterectomy   Lifestyle  . Physical activity:    Days per week: Not on file    Minutes per session: Not on file  . Stress: Not on file  Relationships  . Social connections:    Talks on phone: Not on file    Gets together: Not on file    Attends religious service: Not on file    Active member of club or organization: Not on file    Attends meetings of clubs or organizations: Not on file    Relationship status: Not on file  Other Topics Concern  . Not on file  Social History Narrative   Lives at home   Husband Shaun   Three daughters   Tries to exercise    Allergies:  Allergies  Allergen Reactions  . Ciprofloxacin Hcl Other (See Comments)    Silent seizures.  . Coppertone Kids Spf15 [Albolene] Hives and Swelling  . Vicodin [Hydrocodone-Acetaminophen] Other (See Comments)    Makes her feel drunk and she prefers not to take this.    Metabolic Disorder Labs: No results found for: HGBA1C, MPG No results found for: PROLACTIN Lab Results  Component Value Date   CHOL 148 12/26/2016   TRIG 30 12/26/2016   HDL 50 (L) 12/26/2016   CHOLHDL 3.0 12/26/2016   VLDL 6 12/26/2016   LDLCALC 92 12/26/2016   No results found for: TSH  Therapeutic Level Labs: No results found for: LITHIUM No results found for: VALPROATE No components found for:  CBMZ  Current Medications: Current Outpatient Medications  Medication Sig Dispense Refill  . doxycycline (VIBRAMYCIN) 100 MG capsule     . amphetamine-dextroamphetamine (ADDERALL XR) 10 MG 24 hr capsule Take 1 capsule (10 mg total) by mouth daily. 30 capsule 0  . [START ON 01/01/2019] amphetamine-dextroamphetamine (ADDERALL XR) 10 MG 24 hr capsule Take 1 capsule (10 mg total) by mouth daily. 30 capsule 0  . [START ON 02/01/2019] amphetamine-dextroamphetamine (ADDERALL XR) 10 MG 24 hr capsule Take 1 capsule (10 mg total) by mouth daily for 30 days. 30 capsule 0  . lisdexamfetamine (VYVANSE) 30 MG capsule Take 1 capsule (30 mg total) by mouth daily before  breakfast. 30 capsule 0  . lisdexamfetamine (VYVANSE) 30 MG capsule Take 1 capsule (30 mg total) by mouth daily before breakfast. 30 capsule 0  . lisdexamfetamine (VYVANSE) 30 MG capsule Take 1 capsule (30 mg  total) by mouth daily before breakfast. 30 capsule 0   No current facility-administered medications for this visit.      Musculoskeletal: Strength & Muscle Tone: within normal limits Gait & Station: normal Patient leans: N/A   Psychiatric Specialty Exam: Review of Systems  Psychiatric/Behavioral: Negative for depression, hallucinations, memory loss, substance abuse and suicidal ideas. The patient is not nervous/anxious and does not have insomnia.   All other systems reviewed and are negative.   Blood pressure 126/85, pulse 96, height 5\' 3"  (1.6 m), weight 150 lb (68 kg), last menstrual period 08/31/2016, SpO2 100 %.Body mass index is 26.57 kg/m.  General Appearance: Fairly Groomed  Eye Contact:  Good  Speech:  Clear and Coherent  Volume:  Normal  Mood:  "good"  Affect:  Appropriate, Congruent and Full Range  Thought Process:  Coherent  Orientation:  Full (Time, Place, and Person)  Thought Content: Logical   Suicidal Thoughts:  No  Homicidal Thoughts:  No  Memory:  Immediate;   Good  Judgement:  Good  Insight:  Good  Psychomotor Activity:  Normal  Concentration:  Concentration: Good and Attention Span: Good  Recall:  Good  Fund of Knowledge: Good  Language: Good  Akathisia:  No  Handed:  Right  AIMS (if indicated): not done  Assets:  Communication Skills Desire for Improvement  ADL's:  Intact  Cognition: WNL  Sleep:  Good   Screenings:   Assessment and Plan:  Alexis White is a 33 y.o. year old female with a history of ADHD , who presents for follow up appointment for Attention deficit hyperactivity disorder (ADHD), combined type  # ADHD (diagnosed with neuropsych test 05/2017) Patient reports significant fatigue in between taking Adderall.  Noted that this  medication was switched from Vyvanse due to her insurance.  Will try Adderall XR to target ADHD.  Discussed risk of headache, insomnia, appetite loss, worsening anxiety, palpitation and hypertension.  She has no known history of seizure except when she was on ciprofloxacin.   Plan I have reviewed and updated plans as below 1. Discontinue Adderall 2. Start Adderall XR 10 mg daily (discussed with the patient after the interview; the correct dose would be 10 mg, NOT 30 mg) 2. Returnto clinic in three months for 15 mins   The patient demonstrates the following risk factors for suicide: Chronic risk factors for suicide include: N/A. Acute risk factorsfor suicide include: N/A. Protective factorsfor this patient include: positive social support, responsibility to others (children, family), coping skills and hope for the future. Considering these factors, the overall suicide risk at this point appears to be low. Patient isappropriate for outpatient follow up.  [name], MD 12/03/2018, 9:10 AM

## 2018-12-03 ENCOUNTER — Encounter (HOSPITAL_COMMUNITY): Payer: Self-pay | Admitting: Psychiatry

## 2018-12-03 ENCOUNTER — Ambulatory Visit (INDEPENDENT_AMBULATORY_CARE_PROVIDER_SITE_OTHER): Payer: 59 | Admitting: Psychiatry

## 2018-12-03 VITALS — BP 126/85 | HR 96 | Ht 63.0 in | Wt 150.0 lb

## 2018-12-03 DIAGNOSIS — F902 Attention-deficit hyperactivity disorder, combined type: Secondary | ICD-10-CM | POA: Diagnosis not present

## 2018-12-03 MED ORDER — AMPHETAMINE-DEXTROAMPHET ER 10 MG PO CP24: 10.0000 mg | ORAL_CAPSULE | Freq: Every day | ORAL | 0 refills | Status: DC

## 2018-12-03 MED ORDER — AMPHETAMINE-DEXTROAMPHET ER 30 MG PO CP24: 30.0000 mg | ORAL_CAPSULE | Freq: Every day | ORAL | 0 refills | Status: DC

## 2018-12-03 NOTE — Patient Instructions (Signed)
1. Discontinue Adderall 2. Start Adderall XR 30 mg daily  2. Returnto clinic in three months for 15 mins

## 2018-12-06 ENCOUNTER — Telehealth (HOSPITAL_COMMUNITY): Payer: Self-pay | Admitting: *Deleted

## 2018-12-06 NOTE — Telephone Encounter (Signed)
Dr Vanetta Shawl Patient called & stated she's been trying the Adderall Xr 10 mg for a few days now & it's not really working. She's willing to try the Adderall  25 mg.

## 2018-12-06 NOTE — Telephone Encounter (Signed)
Advise her to first try doubling the dose (take 20 mg daily).

## 2018-12-11 ENCOUNTER — Other Ambulatory Visit (HOSPITAL_COMMUNITY): Payer: Self-pay | Admitting: Psychiatry

## 2018-12-11 MED ORDER — AMPHETAMINE-DEXTROAMPHET ER 25 MG PO CP24: 25.0000 mg | ORAL_CAPSULE | ORAL | 0 refills | Status: DC

## 2018-12-11 NOTE — Telephone Encounter (Signed)
Dr Vanetta Shawl Patient has called stating that she has been doubling the dose to 20 mg for the last 5 days & that it has helped  BUT not quite the same as the Vyvanse? Can dose be increased more?

## 2018-12-11 NOTE — Telephone Encounter (Signed)
Ordered 25 mg capsule. Please advise the patient to start 25 mg daily from tomorrow (not today if she already took 20 mg this morning). Advise her to contact the office if she has any side effect from it.

## 2019-01-01 ENCOUNTER — Other Ambulatory Visit: Payer: Self-pay | Admitting: Obstetrics & Gynecology

## 2019-01-09 ENCOUNTER — Other Ambulatory Visit (HOSPITAL_COMMUNITY): Payer: Self-pay | Admitting: Psychiatry

## 2019-01-09 ENCOUNTER — Telehealth (HOSPITAL_COMMUNITY): Payer: Self-pay

## 2019-01-09 ENCOUNTER — Ambulatory Visit: Payer: 59 | Admitting: Obstetrics and Gynecology

## 2019-01-09 ENCOUNTER — Other Ambulatory Visit: Payer: Self-pay

## 2019-01-09 ENCOUNTER — Encounter: Payer: Self-pay | Admitting: Obstetrics and Gynecology

## 2019-01-09 VITALS — BP 131/81 | HR 95 | Ht 63.25 in | Wt 151.8 lb

## 2019-01-09 DIAGNOSIS — M549 Dorsalgia, unspecified: Secondary | ICD-10-CM | POA: Diagnosis not present

## 2019-01-09 LAB — POCT URINALYSIS DIPSTICK
Blood, UA: NEGATIVE
Glucose, UA: NEGATIVE
KETONES UA: NEGATIVE
Leukocytes, UA: NEGATIVE
Nitrite, UA: NEGATIVE
PROTEIN UA: NEGATIVE

## 2019-01-09 MED ORDER — AMPHETAMINE-DEXTROAMPHET ER 25 MG PO CP24: 25.0000 mg | ORAL_CAPSULE | ORAL | 0 refills | Status: DC

## 2019-01-09 MED ORDER — CEPHALEXIN 500 MG PO CAPS: 500.0000 mg | ORAL_CAPSULE | Freq: Three times a day (TID) | ORAL | 0 refills | Status: DC

## 2019-01-09 NOTE — Telephone Encounter (Signed)
Medication refill requests - Patient called stating she needed new orders for her recently started Adderall XR 25 mg to be sent into her [facility].  States she only got one 30 day order on 12/11/18 and does not return until 03/04/19. Requests Dr. Vanetta Shawl send in new orders as reports doing better with current new dosage.

## 2019-01-09 NOTE — Progress Notes (Signed)
Patient ID: Alexis White, female   DOB: [birth year], 33 y.o.   MRN: [id number]   [facility] Visit  @DATE @            Patient name: Alexis White MRN [id number]  Date of birth: 1986/08/18  CC & HPI:  [name] is a 33 y.o. female presenting today for kidney infection. Right flank pain. Feels as if kidney is bruised. For a few days afterwards back pain will get worse followed by fever. CIPRO causes 'silent seizure'. Augmentin makes her feel drunk after a few days. Pt gives detailed hx of being unable to detect uti in past til fevers of 104 noted quickly.  ROS:  ROS +slight urinary frequency +right flank discomfort mild -fever -chills All systems are negative except as noted in the HPI and PMH.  Lengthy hx ADHD  Pertinent History Reviewed:   Reviewed:  Medical         Past Medical History:  Diagnosis Date  . Absence seizure (HCC)   . Acute kidney failure following labor and delivery   . Acute liver failure   . Allergy   . Anemia   . Asthma   . Complication of anesthesia   . Depression   . HELLP syndrome in first trimester 2009  . HELLP syndrome in first trimester   . Kidney infection   . PONV (postoperative nausea and vomiting)   . Seizures (HCC)                               Surgical Hx:    Past Surgical History:  Procedure Laterality Date  . ABDOMINAL HYSTERECTOMY N/A 08/31/2016   Procedure: ABDOMINAL HYSTERECTOMY WITH REMOVAL OF CERVIX;  Surgeon: Lazaro Arms, MD;  Location: [facility];  Service: Gynecology;  Laterality: N/A;  . BREAST ENHANCEMENT SURGERY    . CESAREAN SECTION    . SCAR REVISION  08/31/2016   Procedure: REVISION OF ABDOMINAL WALL SCAR;  Surgeon: Lazaro Arms, MD;  Location: [facility];  Service: Gynecology;;  . TUBAL LIGATION     Medications: Reviewed & Updated - see associated section                       Current Outpatient Medications:  .  amphetamine-dextroamphetamine (ADDERALL XR) 10 MG 24 hr capsule, Take 1 capsule (10 mg total) by  mouth daily., Disp: 30 capsule, Rfl: 0 .  amphetamine-dextroamphetamine (ADDERALL XR) 10 MG 24 hr capsule, Take 1 capsule (10 mg total) by mouth daily., Disp: 30 capsule, Rfl: 0 .  [START ON 02/01/2019] amphetamine-dextroamphetamine (ADDERALL XR) 10 MG 24 hr capsule, Take 1 capsule (10 mg total) by mouth daily for 30 days., Disp: 30 capsule, Rfl: 0 .  amphetamine-dextroamphetamine (ADDERALL XR) 25 MG 24 hr capsule, Take 1 capsule by mouth every morning., Disp: 30 capsule, Rfl: 0 .  doxycycline (VIBRAMYCIN) 100 MG capsule, , Disp: , Rfl:  .  lisdexamfetamine (VYVANSE) 30 MG capsule, Take 1 capsule (30 mg total) by mouth daily before breakfast., Disp: 30 capsule, Rfl: 0 .  lisdexamfetamine (VYVANSE) 30 MG capsule, Take 1 capsule (30 mg total) by mouth daily before breakfast., Disp: 30 capsule, Rfl: 0 .  lisdexamfetamine (VYVANSE) 30 MG capsule, Take 1 capsule (30 mg total) by mouth daily before breakfast., Disp: 30 capsule, Rfl: 0   Social History: Reviewed -  reports that she quit smoking about 7 years  ago. Her smoking use included e-cigarettes. She has never used smokeless tobacco.  Objective Findings:  Vitals: Last menstrual period 08/31/2016.  PHYSICAL EXAMINATION General appearance - alert, well appearing, and in no distress Mental status - normal mood, behavior, speech, dress, motor activity, and thought processes, affect appropriate to mood  PELVIC  DEFERRED   Assessment & Plan:   A:  1.  Kidney infection, possible ,  2. Hx difficulty diagnosing early kidney infxn. 3. Hx ADHD  P:  1.  urine C&S 2. Keflex 5 days 3. Call  Friday for results    By signing my name below, I, Arnette Norris, attest that this documentation has been prepared under the direction and in the presence of Tilda Burrow, MD. Electronically Signed: Arnette Norris Medical Scribe. 01/09/19. 2:15 PM.  I personally performed the services described in this documentation, which was SCRIBED in my presence. The  recorded information has been reviewed and considered accurate. It has been edited as necessary during review. Tilda Burrow, MD

## 2019-01-09 NOTE — Telephone Encounter (Signed)
Ordered for two months

## 2019-01-09 NOTE — Telephone Encounter (Signed)
Medication management - Telephone call with patient to inform Dr. Vanetta Shawl had sent in 2 months supply of her new Adderall XR 25 mg to last until she returns on 03/04/19.  Patient to call back if any problems filling as ordered.

## 2019-01-11 LAB — URINE CULTURE: ORGANISM ID, BACTERIA: NO GROWTH

## 2019-01-21 ENCOUNTER — Telehealth: Payer: Self-pay | Admitting: *Deleted

## 2019-01-21 NOTE — Telephone Encounter (Signed)
Pt informed that no visitors or children allowed to come to appt. Pt states that she will have to reschedule.

## 2019-01-22 ENCOUNTER — Other Ambulatory Visit: Payer: Self-pay | Admitting: Obstetrics & Gynecology

## 2019-02-27 NOTE — Progress Notes (Signed)
Virtual Visit via Video Note  I connected with Alexis White L Alexis White on 03/04/19 at  8:30 AM EDT by a video enabled telemedicine application and verified that I am speaking with the correct person using two identifiers.   I discussed the limitations of evaluation and management by telemedicine and the availability of in person appointments. The patient expressed understanding and agreed to proceed.   I discussed the assessment and treatment plan with the patient. The patient was provided an opportunity to ask questions and all were answered. The patient agreed with the plan and demonstrated an understanding of the instructions.   The patient was advised to call back or seek an in-person evaluation if the symptoms worsen or if the condition fails to improve as anticipated.  I provided 15 minutes of non-face-to-face time during this encounter.   Neysa White Alexis Pontiff, MD    [facility]/NP OP Progress Note  03/04/2019 8:45 AM Alexis OrganDevin L White  MRN:  [id number]  Chief Complaint:  Chief Complaint    Follow-up; ADHD     HPI:  This is a follow-up appointment for ADHD.  She states that she has been doing very well.  She feels that Adderall XR is working like Vyvanse which she used to be on.  She usually takes it at 8:00 in the morning, and the effect would last until 9 at night.  She has been working on art work at her children's room. 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  She has been remodeling the house, which keeps her active.  She reports good relationship with her children.  Shee feels good that she is now able to focus well when her children are there for home schooling.  She denies insomnia.  She denies anxiety or depression.  She denies SI.  She has fair appetite.  She might have gained some that she has more access to food.  She denies headache.  She denies memory loss. She has good attention and focuses well.   adderall XR 25  filled on  4/11  Visit Diagnosis:    ICD-10-CM   1. Attention deficit hyperactivity disorder (ADHD), combined type F90.2     Past Psychiatric History: Please see initial evaluation for full details. I have reviewed the history. No updates at this time.     Past Medical History:  Past Medical History:  Diagnosis Date  . Absence seizure (HCC)   . Acute kidney failure following labor and delivery   . Acute liver failure   . Allergy   . Anemia   . Asthma   . Complication of anesthesia   . Depression   . HELLP syndrome in first trimester 2009  . HELLP syndrome in first trimester   . Kidney infection   . PONV (postoperative nausea and vomiting)   . Seizures (HCC)     Past Surgical History:  Procedure Laterality Date  . ABDOMINAL HYSTERECTOMY N/A 08/31/2016   Procedure: ABDOMINAL HYSTERECTOMY WITH REMOVAL OF CERVIX;  Surgeon: [name], MD;  Location: [facility];  Service: Gynecology;  Laterality: N/A;  . BREAST ENHANCEMENT SURGERY    . CESAREAN SECTION    . SCAR REVISION  08/31/2016   Procedure: REVISION OF ABDOMINAL WALL SCAR;  Surgeon: [name], MD;  Location: [facility];  Service: Gynecology;;  . TUBAL LIGATION      Family Psychiatric History: Please see initial evaluation for full details. I have  reviewed the history. No updates at this time.     Family History:  Family History  Adopted: Yes  Problem Relation Age of Onset  . Heart disease Father 86       "code blue"  . Alcohol abuse Father   . Diabetes Father   . Cancer Maternal Aunt        skin  . Breast cancer Maternal Aunt   . Heart disease Maternal Aunt        CHF  . Cancer Paternal Grandfather        bladder  . Heart disease Maternal Grandmother   . Congestive Heart Failure Maternal Grandmother   . Breast cancer Maternal Aunt   . Cancer Maternal Aunt        skin  . Nevi Mother   . Heart disease Paternal Grandmother   . Diabetes Other   . Heart disease Other     Social History:  Social History    Socioeconomic History  . Marital status: Married    Spouse name: Shaun  . Number of children: 3  . Years of education: 10  . Highest education level: Not on file  Occupational History  . Occupation: massage therapy    Comment: by schooling  . Occupation: Games developer- cakes    Comment: self  Social Needs  . Financial resource strain: Not on file  . Food insecurity:    Worry: Not on file    Inability: Not on file  . Transportation needs:    Medical: Not on file    Non-medical: Not on file  Tobacco Use  . Smoking status: Former Smoker    Types: E-cigarettes    Last attempt to quit: 11/01/2011    Years since quitting: 7.3  . Smokeless tobacco: Never Used  . Tobacco comment: 02-06-2017 PER PT [facility].  Substance and Sexual Activity  . Alcohol use: No    Frequency: Never  . Drug use: No  . Sexual activity: Yes    Birth control/protection: Surgical    Comment: hysterectomy  Lifestyle  . Physical activity:    Days per week: Not on file    Minutes per session: Not on file  . Stress: Not on file  Relationships  . Social connections:    Talks on phone: Not on file    Gets together: Not on file    Attends religious service: Not on file    Active member of club or organization: Not on file    Attends meetings of clubs or organizations: Not on file    Relationship status: Not on file  Other Topics Concern  . Not on file  Social History Narrative   Lives at home   Husband Shaun   Three daughters   Tries to exercise    Allergies:  Allergies  Allergen Reactions  . Ciprofloxacin Hcl Other (See Comments)    Silent seizures.  . Coppertone Kids Spf15 [Albolene] Hives and Swelling  . Vicodin [Hydrocodone-Acetaminophen] Other (See Comments)    Makes her feel drunk and she prefers not to take this.    Metabolic Disorder Labs: No results found for: HGBA1C, MPG No results found for: PROLACTIN Lab Results  Component Value Date   CHOL 148 12/26/2016   TRIG 30 12/26/2016   HDL  50 (L) 12/26/2016   CHOLHDL 3.0 12/26/2016   VLDL 6 12/26/2016   LDLCALC 92 12/26/2016   No results found for: TSH  Therapeutic Level Labs: No results found for: LITHIUM No results  found for: VALPROATE No components found for:  CBMZ  Current Medications: Current Outpatient Medications  Medication Sig Dispense Refill  . amphetamine-dextroamphetamine (ADDERALL XR) 25 MG 24 hr capsule Take 1 capsule by mouth every morning for 30 days. 30 capsule 0  . [START ON 04/09/2019] amphetamine-dextroamphetamine (ADDERALL XR) 25 MG 24 hr capsule Take 1 capsule by mouth every morning for 30 days. 30 capsule 0  . [START ON 03/10/2019] amphetamine-dextroamphetamine (ADDERALL XR) 25 MG 24 hr capsule Take 1 capsule by mouth every morning for 30 days. 30 capsule 0  . [START ON 05/09/2019] amphetamine-dextroamphetamine (ADDERALL XR) 25 MG 24 hr capsule Take 1 capsule by mouth every morning for 30 days. 30 capsule 0  . cephALEXin (KEFLEX) 500 MG capsule Take 1 capsule (500 mg total) by mouth 3 (three) times daily. 21 capsule 0   No current facility-administered medications for this visit.      Musculoskeletal: Strength & Muscle Tone: N/A Gait & Station: N/A Patient leans: N/A  Psychiatric Specialty Exam: Review of Systems  Psychiatric/Behavioral: Negative for depression, hallucinations, memory loss, substance abuse and suicidal ideas. The patient is not nervous/anxious and does not have insomnia.   All other systems reviewed and are negative.   Last menstrual period 08/31/2016.There is no height or weight on file to calculate BMI.  General Appearance: Fairly Groomed  Eye Contact:  Good  Speech:  Clear and Coherent  Volume:  Normal  Mood:  "good"  Affect:  Appropriate, Congruent and Full Range  Thought Process:  Coherent  Orientation:  Full (Time, Place, and Person)  Thought Content: Logical   Suicidal Thoughts:  No  Homicidal Thoughts:  No  Memory:  Immediate;   Good  Judgement:  Good  Insight:   Good  Psychomotor Activity:  Normal  Concentration:  Concentration: Good and Attention Span: Good  Recall:  Good  Fund of Knowledge: Good  Language: Good  Akathisia:  No  Handed:  Right  AIMS (if indicated): not done  Assets:  Communication Skills Desire for Improvement  ADL's:  Intact  Cognition: WNL  Sleep:  Good   Screenings:   Assessment and Plan:  AICIA BABINSKI is a 33 y.o. year old female with a history of ADHD , who presents for follow up appointment for Attention deficit hyperactivity disorder (ADHD), combined type  # ADHD(diagnosed with neuropsych test 05/2017) Patient reports significant improvement after switching from Adderall to XR.  Will continue current dose to target ADHD.  Discussed risk of headache, insomnia, appetite loss, worsening anxiety, palpitation and hypertension.  She has no known history of seizure except when she was on ciprofloxacin.  Discussed behavioral activation.   Plan 1. Continue Adderall XR 25 mg daily  2. Next appointment: 8/3 at 1 PM for 20 mins, video   The patient demonstrates the following risk factors for suicide: Chronic risk factors for suicide include: N/A. Acute risk factorsfor suicide include: N/A. Protective factorsfor this patient include: positive social support, responsibility to others (children, family), coping skills and hope for the future. Considering these factors, the overall suicide risk at this point appears to be low. Patient isappropriate for outpatient follow up.  [name], MD 03/04/2019, 8:45 AM

## 2019-03-04 ENCOUNTER — Encounter (HOSPITAL_COMMUNITY): Payer: Self-pay | Admitting: Psychiatry

## 2019-03-04 ENCOUNTER — Other Ambulatory Visit: Payer: Self-pay

## 2019-03-04 ENCOUNTER — Ambulatory Visit (INDEPENDENT_AMBULATORY_CARE_PROVIDER_SITE_OTHER): Payer: 59 | Admitting: Psychiatry

## 2019-03-04 DIAGNOSIS — F902 Attention-deficit hyperactivity disorder, combined type: Secondary | ICD-10-CM | POA: Diagnosis not present

## 2019-03-04 MED ORDER — AMPHETAMINE-DEXTROAMPHET ER 25 MG PO CP24: 25.0000 mg | ORAL_CAPSULE | ORAL | 0 refills | Status: DC

## 2019-03-04 NOTE — Patient Instructions (Signed)
1. Continue Adderall XR 25 mg daily  2. Next appointment: 8/3 at 1 PM

## 2019-03-05 ENCOUNTER — Encounter: Payer: Self-pay | Admitting: Family Medicine

## 2019-03-19 ENCOUNTER — Other Ambulatory Visit: Payer: Self-pay | Admitting: Obstetrics & Gynecology

## 2019-04-08 ENCOUNTER — Encounter: Payer: Self-pay | Admitting: *Deleted

## 2019-04-09 ENCOUNTER — Encounter: Payer: Self-pay | Admitting: Obstetrics & Gynecology

## 2019-04-09 ENCOUNTER — Ambulatory Visit (INDEPENDENT_AMBULATORY_CARE_PROVIDER_SITE_OTHER): Payer: 59 | Admitting: Obstetrics & Gynecology

## 2019-04-09 ENCOUNTER — Other Ambulatory Visit: Payer: Self-pay

## 2019-04-09 VITALS — BP 118/74 | HR 87 | Ht 63.0 in | Wt 155.0 lb

## 2019-04-09 DIAGNOSIS — R1032 Left lower quadrant pain: Secondary | ICD-10-CM

## 2019-04-09 DIAGNOSIS — Z01419 Encounter for gynecological examination (general) (routine) without abnormal findings: Secondary | ICD-10-CM

## 2019-04-09 DIAGNOSIS — Z90710 Acquired absence of both cervix and uterus: Secondary | ICD-10-CM | POA: Diagnosis not present

## 2019-04-09 NOTE — Progress Notes (Signed)
Subjective:     Alexis White is a 33 y.o. female here for a routine exam.  Patient's last menstrual period was 08/31/2016 (exact date). [id number] Birth Control Method:  hysterectomy Menstrual Calendar(currently): hysterectomy  Current complaints: post coital irritations. Plus LLQ pain, wonders if it is a hernia  Current acute medical issues:     Recent Gynecologic History Patient's last menstrual period was 08/31/2016 (exact date). Last Pap: ,   Last mammogram: age 33,    Past Medical History:  Diagnosis Date  . Absence seizure (HCC)   . Acute kidney failure following labor and delivery   . Acute liver failure   . Allergy   . Anemia   . Asthma   . Complication of anesthesia   . Depression   . HELLP syndrome in first trimester 2009  . HELLP syndrome in first trimester   . Kidney infection   . PONV (postoperative nausea and vomiting)   . Seizures (HCC)     Past Surgical History:  Procedure Laterality Date  . ABDOMINAL HYSTERECTOMY N/A 08/31/2016   Procedure: ABDOMINAL HYSTERECTOMY WITH REMOVAL OF CERVIX;  Surgeon: [name], MD;  Location: [facility];  Service: Gynecology;  Laterality: N/A;  . BREAST ENHANCEMENT SURGERY    . CESAREAN SECTION    . SCAR REVISION  08/31/2016   Procedure: REVISION OF ABDOMINAL WALL SCAR;  Surgeon: [name], MD;  Location: [facility];  Service: Gynecology;;  . TUBAL LIGATION      OB History    Gravida  10   Para      Term      Preterm      AB  7   Living  3     SAB  7   TAB      Ectopic      Multiple      Live Births  3           Social History   Socioeconomic History  . Marital status: Married    Spouse name: Shaun  . Number of children: 3  . Years of education: 5513  . Highest education level: Not on file  Occupational History  . Occupation: massage therapy    Comment: by schooling  . Occupation: Games developerBake art- cakes    Comment: self  Social Needs  . Financial resource strain: Not on file  . Food insecurity:     Worry: Not on file    Inability: Not on file  . Transportation needs:    Medical: Not on file    Non-medical: Not on file  Tobacco Use  . Smoking status: Former Smoker    Types: E-cigarettes    Last attempt to quit: 11/01/2011    Years since quitting: 7.4  . Smokeless tobacco: Never Used  . Tobacco comment: 02-06-2017 PER PT [facility].  Substance and Sexual Activity  . Alcohol use: No    Frequency: Never  . Drug use: No  . Sexual activity: Yes    Birth control/protection: Surgical    Comment: hysterectomy  Lifestyle  . Physical activity:    Days per week: Not on file    Minutes per session: Not on file  . Stress: Not on file  Relationships  . Social connections:    Talks on phone: Not on file    Gets together: Not on file    Attends religious service: Not on file    Active member of club or organization: Not on file  Attends meetings of clubs or organizations: Not on file    Relationship status: Not on file  Other Topics Concern  . Not on file  Social History Narrative   Lives at home   Husband Shaun   Three daughters   Tries to exercise    Family History  Adopted: Yes  Problem Relation Age of Onset  . Heart disease Father 4540       "code blue"  . Alcohol abuse Father   . Diabetes Father   . Cancer Maternal Aunt        skin  . Breast cancer Maternal Aunt   . Heart disease Maternal Aunt        CHF  . Cancer Paternal Grandfather        bladder  . Heart disease Maternal Grandmother   . Congestive Heart Failure Maternal Grandmother   . Breast cancer Maternal Aunt   . Cancer Maternal Aunt        skin  . Nevi Mother   . Heart disease Paternal Grandmother   . Diabetes Other   . Heart disease Other      Current Outpatient Medications:  .  [START ON 05/09/2019] amphetamine-dextroamphetamine (ADDERALL XR) 25 MG 24 hr capsule, Take 1 capsule by mouth every morning for 30 days., Disp: 30 capsule, Rfl: 0 .  amphetamine-dextroamphetamine (ADDERALL XR) 25 MG 24 hr  capsule, Take 1 capsule by mouth every morning for 30 days., Disp: 30 capsule, Rfl: 0 .  cephALEXin (KEFLEX) 500 MG capsule, Take 1 capsule (500 mg total) by mouth 3 (three) times daily. (Patient not taking: Reported on 04/09/2019), Disp: 21 capsule, Rfl: 0  Review of Systems  Review of Systems  Constitutional: Negative for fever, chills, weight loss, malaise/fatigue and diaphoresis.  HENT: Negative for hearing loss, ear pain, nosebleeds, congestion, sore throat, neck pain, tinnitus and ear discharge.   Eyes: Negative for blurred vision, double vision, photophobia, pain, discharge and redness.  Respiratory: Negative for cough, hemoptysis, sputum production, shortness of breath, wheezing and stridor.   Cardiovascular: Negative for chest pain, palpitations, orthopnea, claudication, leg swelling and PND.  Gastrointestinal: negative for abdominal pain. Negative for heartburn, nausea, vomiting, diarrhea, constipation, blood in stool and melena.  Genitourinary: Negative for dysuria, urgency, frequency, hematuria and flank pain.  Musculoskeletal: Negative for myalgias, back pain, joint pain and falls.  Skin: Negative for itching and rash.  Neurological: Negative for dizziness, tingling, tremors, sensory change, speech change, focal weakness, seizures, loss of consciousness, weakness and headaches.  Endo/Heme/Allergies: Negative for environmental allergies and polydipsia. Does not bruise/bleed easily.  Psychiatric/Behavioral: Negative for depression, suicidal ideas, hallucinations, memory loss and substance abuse. The patient is not nervous/anxious and does not have insomnia.        Objective:  Blood pressure 118/74, pulse 87, height 5\' 3"  (1.6 m), weight 155 lb (70.3 kg), last menstrual period 08/31/2016.   Physical Exam  Vitals reviewed. Constitutional: She is oriented to person, place, and time. She appears well-developed and well-nourished.  HENT:  Head: Normocephalic and atraumatic.         Right Ear: External ear normal.  Left Ear: External ear normal.  Nose: Nose normal.  Mouth/Throat: Oropharynx is clear and moist.  Eyes: Conjunctivae and EOM are normal. Pupils are equal, round, and reactive to light. Right eye exhibits no discharge. Left eye exhibits no discharge. No scleral icterus.  Neck: Normal range of motion. Neck supple. No tracheal deviation present. No thyromegaly present.  Cardiovascular: Normal rate, regular rhythm,  normal heart sounds and intact distal pulses.  Exam reveals no gallop and no friction rub.   No murmur heard. Respiratory: Effort normal and breath sounds normal. No respiratory distress. She has no wheezes. She has no rales. She exhibits no tenderness.  GI: Soft. Bowel sounds are normal. She exhibits no distension and no mass. There is no tenderness. There is no rebound and no guarding.  Genitourinary:  Breasts no masses skin changes or nipple changes bilaterally      Vulva is normal without lesions Vagina is pink moist without discharge Cervix absent Uterus is absent Adnexa is negative with normal sized ovaries   Musculoskeletal: Normal range of motion. She exhibits no edema and no tenderness.  Neurological: She is alert and oriented to person, place, and time. She has normal reflexes. She displays normal reflexes. No cranial nerve deficit. She exhibits normal muscle tone. Coordination normal.  Skin: Skin is warm and dry. No rash noted. No erythema. No pallor.  Psychiatric: She has a normal mood and affect. Her behavior is normal. Judgment and thought content normal.       Medications Ordered at today's visit: No orders of the defined types were placed in this encounter.   Other orders placed at today's visit: No orders of the defined types were placed in this encounter.  Trigger Point Injection   Pre-operative diagnosis: myofascial pain, probable neurogenic  Post-operative diagnosis: myofascial pain  After risks and benefits were  explained including bleeding, infection, worsening of the pain, damage to the area being injected, weakness, allergic reaction to medications, vascular injection, and nerve damage, signed consent was obtained.  All questions were answered.    The area of the trigger point was identified and the skin prepped three times with alcohol and the alcohol allowed to dry.  Next, a 25 gauge 0.5 inch needle was placed in the area of the trigger point.  Once reproduction of the pain was elicited and negative aspiration confirmed, the trigger point was injected and the needle removed.    The patient did tolerate the procedure well and there were not complications.    Medication used:  10cc of 0.5% marcaine  Trigger points injected: 1    Trigger point(s) location(s):  left border of rectus and oblique just above pubis    Assessment:    Healthy female exam.    Plan:    post coital irritation: recommend jojoba oil +/- neem oil for lubrication and post coitally     Return if symptoms worsen or fail to improve.

## 2019-04-15 ENCOUNTER — Telehealth (HOSPITAL_COMMUNITY): Payer: Self-pay

## 2019-04-15 NOTE — Telephone Encounter (Signed)
6/17 is appointment date

## 2019-04-15 NOTE — Telephone Encounter (Signed)
Medication management - Telephone call with patient after she left a message she does not feel her Adderall XR 25 mg is helping as much as it had originally done.  States she now feels overwhelmed more and is easily distracted and harder to focus.  States that she just feels the medication is not working as well as it had done initially and questions if she needs "a medication adjustment".  Patient not scheduled to return until 06/03/19.

## 2019-04-15 NOTE — Telephone Encounter (Signed)
Pease advise the patient to make sooner follow up to discuss this.

## 2019-04-15 NOTE — Telephone Encounter (Signed)
SCHEDULED 6/15

## 2019-04-16 NOTE — Progress Notes (Signed)
Virtual Visit via Video Note  I connected with Alexis White on 04/17/19 at 11:35 AM EDT by a video enabled telemedicine application and verified that I am speaking with the correct person using two identifiers.   I discussed the limitations of evaluation and management by telemedicine and the availability of in person appointments. The patient expressed understanding and agreed to proceed.    I discussed the assessment and treatment plan with the patient. The patient was provided an opportunity to ask questions and all were answered. The patient agreed with the plan and demonstrated an understanding of the instructions.   The patient was advised to call back or seek an in-person evaluation if the symptoms worsen or if the condition fails to improve as anticipated.  I provided 15 minutes of non-face-to-face time during this encounter.   Norman Clay, MD    [facility] MD/PA/NP OP Progress Note  04/17/2019 11:55 AM SHANITRA White  MRN:  [id number]  Chief Complaint:  Chief Complaint    ADHD; Follow-up     HPI:  This is a follow-up appointment for ADHD.  This appointment is made sooner than originally scheduled due to patient request.  She states that she has been forgetting and has difficulty in attention later in the day for the past few weeks.  Although Adderall used to be "working perfectly fine," she feels that it stopped working. Her husband notices this difference, and he would ask whether she took medication. There has been no medication/brand change. 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 She denies depression or anxiety except that she feels stressed with multi tasking. She denies SI. She denies appetite loss. She denies SI. She denies any side effect from adderall. She denies alcohol use or drug use.    Wt Readings from  Last 3 Encounters:  04/09/19 155 lb (70.3 kg)  01/09/19 151 lb 12.8 oz (68.9 kg)  12/03/18 150 lb (68 kg)    Visit Diagnosis:    ICD-10-CM   1. Attention deficit hyperactivity disorder (ADHD), combined type  F90.2     Past Psychiatric History: Please see initial evaluation for full details. I have reviewed the history. No updates at this time.     Past Medical History:  Past Medical History:  Diagnosis Date  . Absence seizure ([place])   . Acute kidney failure following labor and delivery   . Acute liver failure   . Allergy   . Anemia   . Asthma   . Complication of anesthesia   . Depression   . HELLP syndrome in first trimester 2009  . HELLP syndrome in first trimester   . Kidney infection   . PONV (postoperative nausea and vomiting)   . Seizures ([place])     Past Surgical History:  Procedure Laterality Date  . ABDOMINAL HYSTERECTOMY N/A 08/31/2016   Procedure: ABDOMINAL HYSTERECTOMY WITH REMOVAL OF CERVIX;  Surgeon: Florian Buff, MD;  Location: [facility];  Service: Gynecology;  Laterality: N/A;  . BREAST ENHANCEMENT SURGERY    . CESAREAN SECTION    . SCAR REVISION  08/31/2016   Procedure: REVISION OF ABDOMINAL WALL SCAR;  Surgeon: Florian Buff, MD;  Location: [facility];  Service: Gynecology;;  . TUBAL LIGATION      Family Psychiatric History: Please see initial evaluation for full details. I have  reviewed the history. No updates at this time.     Family History:  Family History  Adopted: Yes  Problem Relation Age of Onset  . Heart disease Father 8440       "code blue"  . Alcohol abuse Father   . Diabetes Father   . Cancer Maternal Aunt        skin  . Breast cancer Maternal Aunt   . Heart disease Maternal Aunt        CHF  . Cancer Paternal Grandfather        bladder  . Heart disease Maternal Grandmother   . Congestive Heart Failure Maternal Grandmother   . Breast cancer Maternal Aunt   . Cancer Maternal Aunt        skin  . Nevi Mother   . Heart disease Paternal  Grandmother   . Diabetes Other   . Heart disease Other     Social History:  Social History   Socioeconomic History  . Marital status: Married    Spouse name: Alexis White  . Number of children: 3  . Years of education: 1013  . Highest education level: Not on file  Occupational History  . Occupation: massage therapy    Comment: by schooling  . Occupation: Games developerBake art- cakes    Comment: self  Social Needs  . Financial resource strain: Not on file  . Food insecurity    Worry: Not on file    Inability: Not on file  . Transportation needs    Medical: Not on file    Non-medical: Not on file  Tobacco Use  . Smoking status: Former Smoker    Types: E-cigarettes    Quit date: 11/01/2011    Years since quitting: 7.4  . Smokeless tobacco: Never Used  . Tobacco comment: 02-06-2017 PER PT [facility].  Substance and Sexual Activity  . Alcohol use: No    Frequency: Never  . Drug use: No  . Sexual activity: Yes    Birth control/protection: Surgical    Comment: hysterectomy  Lifestyle  . Physical activity    Days per week: Not on file    Minutes per session: Not on file  . Stress: Not on file  Relationships  . Social Musicianconnections    Talks on phone: Not on file    Gets together: Not on file    Attends religious service: Not on file    Active member of club or organization: Not on file    Attends meetings of clubs or organizations: Not on file    Relationship status: Not on file  Other Topics Concern  . Not on file  Social History Narrative   Lives at home   Husband Alexis White   Three daughters   Tries to exercise    Allergies:  Allergies  Allergen Reactions  . Ciprofloxacin Hcl Other (See Comments)    Silent seizures.  . Coppertone Kids Spf15 [Albolene] Hives and Swelling  . Vicodin [Hydrocodone-Acetaminophen] Other (See Comments)    Makes her feel drunk and she prefers not to take this.    Metabolic Disorder Labs: No results found for: HGBA1C, MPG No results found for: PROLACTIN Lab  Results  Component Value Date   CHOL 148 12/26/2016   TRIG 30 12/26/2016   HDL 50 (L) 12/26/2016   CHOLHDL 3.0 12/26/2016   VLDL 6 12/26/2016   LDLCALC 92 12/26/2016   No results found for: TSH  Therapeutic Level Labs: No results found for: LITHIUM No results found for:  VALPROATE No components found for:  CBMZ  Current Medications: Current Outpatient Medications  Medication Sig Dispense Refill  . amphetamine-dextroamphetamine (ADDERALL XR) 25 MG 24 hr capsule Take 1 capsule by mouth every morning for 30 days. 30 capsule 0  . [START ON 05/09/2019] amphetamine-dextroamphetamine (ADDERALL XR) 25 MG 24 hr capsule Take 1 capsule by mouth every morning for 30 days. 30 capsule 0  . cephALEXin (KEFLEX) 500 MG capsule Take 1 capsule (500 mg total) by mouth 3 (three) times daily. (Patient not taking: Reported on 04/09/2019) 21 capsule 0   No current facility-administered medications for this visit.      Musculoskeletal: Strength & Muscle Tone: N/A Gait & Station: N/A Patient leans: N/A  Psychiatric Specialty Exam: Review of Systems  Psychiatric/Behavioral: Negative for depression, hallucinations, memory loss, substance abuse and suicidal ideas. The patient is not nervous/anxious and does not have insomnia.   All other systems reviewed and are negative.   Last menstrual period 08/31/2016.There is no height or weight on file to calculate BMI.  General Appearance: Fairly Groomed  Eye Contact:  Good  Speech:  Clear and Coherent  Volume:  Normal  Mood:  "good"  Affect:  Appropriate, Congruent and euthymic  Thought Process:  Coherent  Orientation:  Full (Time, Place, and Person)  Thought Content: Logical   Suicidal Thoughts:  No  Homicidal Thoughts:  No  Memory:  Immediate;   Good  Judgement:  Good  Insight:  Good  Psychomotor Activity:  Normal  Concentration:  Concentration: Good and Attention Span: Good  Recall:  Good  Fund of Knowledge: Good  Language: Good  Akathisia:  No   Handed:  Right  AIMS (if indicated): not done  Assets:  Communication Skills Desire for Improvement  ADL's:  Intact  Cognition: WNL  Sleep:  Good   Screenings:   Assessment and Plan:  Alexis White is a 33 y.o. year old female with a history of ADHD, who presents for follow up appointment for ADHD.   # ADHD (diagnosed with neuropsych test 05/2017) She reports worsening in inattention later in the day for the past few weeks, although she did have good response to Adderall until recently.  We first try to work on behavioral modification/taking medication later in the morning to adjust to her schedule.  Will consider up titration of Adderall if this medication does not work.  She has no known history of seizure except when she was on ciprofloxacin.  Discussed potential side effect of headache, insomnia, appetite loss, palpitation and hypertension.   Plan 1. Continue Adderall XR 25 mg daily  - Try to take this medication later in the morning. Contact our office in one to two weeks if you still struggle with attention.  2. Next appointment: 8/3 at 1 PM for 20 mins, video  The patient demonstrates the following risk factors for suicide: Chronic risk factors for suicide include: N/A. Acute risk factorsfor suicide include: N/A. Protective factorsfor this patient include: positive social support, responsibility to others (children, family), coping skills and hope for the future. Considering these factors, the overall suicide risk at this point appears to be low. Patient isappropriate for outpatient follow up.  Neysa Hottereina Fraser Busche, MD 04/17/2019, 11:55 AM

## 2019-04-17 ENCOUNTER — Encounter (HOSPITAL_COMMUNITY): Payer: Self-pay | Admitting: Psychiatry

## 2019-04-17 ENCOUNTER — Other Ambulatory Visit: Payer: Self-pay

## 2019-04-17 ENCOUNTER — Ambulatory Visit (INDEPENDENT_AMBULATORY_CARE_PROVIDER_SITE_OTHER): Payer: 59 | Admitting: Psychiatry

## 2019-04-17 DIAGNOSIS — F902 Attention-deficit hyperactivity disorder, combined type: Secondary | ICD-10-CM

## 2019-04-17 NOTE — Patient Instructions (Signed)
1. Continue Adderall XR 25 mg daily  - Try to take this medication later in the morning. Contact our office in one to two weeks if you still struggle with focus/attention.  2. Next appointment: 8/3 at 1 PM

## 2019-05-09 ENCOUNTER — Telehealth (HOSPITAL_COMMUNITY): Payer: Self-pay

## 2019-05-09 ENCOUNTER — Other Ambulatory Visit (HOSPITAL_COMMUNITY): Payer: Self-pay | Admitting: Psychiatry

## 2019-05-09 MED ORDER — AMPHETAMINE-DEXTROAMPHET ER 30 MG PO CP24: 30.0000 mg | ORAL_CAPSULE | ORAL | 0 refills | Status: DC

## 2019-05-09 NOTE — Telephone Encounter (Signed)
Patient called regarding her Adderall 25mg . She stated that the 25mg  is not quite working well enough and requested Adderall 30mg  instead. Please review and advise. Thank you.

## 2019-05-09 NOTE — Telephone Encounter (Signed)
Ordered Adderall 30 mg daily.

## 2019-05-27 NOTE — Progress Notes (Signed)
Virtual Visit via Video Note  I connected with Alexis White on 06/03/19 at  1:00 PM EDT by a video enabled telemedicine application and verified that I am speaking with the correct person using two identifiers.   I discussed the limitations of evaluation and management by telemedicine and the availability of in person appointments. The patient expressed understanding and agreed to proceed.      I discussed the assessment and treatment plan with the patient. The patient was provided an opportunity to ask questions and all were answered. The patient agreed with the plan and demonstrated an understanding of the instructions.   The patient was advised to call back or seek an in-person evaluation if the symptoms worsen or if the condition fails to improve as anticipated.  I provided 15 minutes of non-face-to-face time during this encounter.   Norman Clay, MD    [facility] MD/PA/NP OP Progress Note  06/03/2019 1:21 PM Alexis White  MRN:  [id number]  Chief Complaint:  Chief Complaint    ADHD; Follow-up     HPI:  - Adderall was uptitrated to 30 mg daily since the last visit This is a follow-up appointment for ADHD.  She states that she has been able to focus better, not scattered since uptitration of Adderall. She does well in the morning for bible study and later in the day. Her mother moved in from [place] last month; although she is finding a place to live for her own, it has been taking more time due to pandemic.  Although she feels nervous about the situation, she has been adjusting to it.  She also talks about her children, who will go back to school.  She sleeps well.  She denies feeling depressed.  She has better concentration.  She denies SI.  She feels anxious at times.  She denies panic attacks.  She denies irritability.  She denies any side effect from Adderall.   Visit Diagnosis:    ICD-10-CM   1. Attention deficit hyperactivity disorder (ADHD), combined type  F90.2     Past  Psychiatric History: Please see initial evaluation for full details. I have reviewed the history. No updates at this time.     Past Medical History:  Past Medical History:  Diagnosis Date  . Absence seizure ([place])   . Acute kidney failure following labor and delivery   . Acute liver failure   . Allergy   . Anemia   . Asthma   . Complication of anesthesia   . Depression   . HELLP syndrome in first trimester 2009  . HELLP syndrome in first trimester   . Kidney infection   . PONV (postoperative nausea and vomiting)   . Seizures ([place])     Past Surgical History:  Procedure Laterality Date  . ABDOMINAL HYSTERECTOMY N/A 08/31/2016   Procedure: ABDOMINAL HYSTERECTOMY WITH REMOVAL OF CERVIX;  Surgeon: Florian Buff, MD;  Location: [facility];  Service: Gynecology;  Laterality: N/A;  . BREAST ENHANCEMENT SURGERY    . CESAREAN SECTION    . SCAR REVISION  08/31/2016   Procedure: REVISION OF ABDOMINAL WALL SCAR;  Surgeon: Florian Buff, MD;  Location: [facility];  Service: Gynecology;;  . TUBAL LIGATION      Family Psychiatric History: Please see initial evaluation for full details. I have reviewed the history. No updates at this time.     Family History:  Family History  Adopted: Yes  Problem Relation Age of Onset  . Heart disease  Father 6240       "code blue"  . Alcohol abuse Father   . Diabetes Father   . Cancer Maternal Aunt        skin  . Breast cancer Maternal Aunt   . Heart disease Maternal Aunt        CHF  . Cancer Paternal Grandfather        bladder  . Heart disease Maternal Grandmother   . Congestive Heart Failure Maternal Grandmother   . Breast cancer Maternal Aunt   . Cancer Maternal Aunt        skin  . Nevi Mother   . Heart disease Paternal Grandmother   . Diabetes Other   . Heart disease Other     Social History:  Social History   Socioeconomic History  . Marital status: Married    Spouse name: Shaun  . Number of children: 3  . Years of education: 4013  .  Highest education level: Not on file  Occupational History  . Occupation: massage therapy    Comment: by schooling  . Occupation: Games developerBake art- cakes    Comment: self  Social Needs  . Financial resource strain: Not on file  . Food insecurity    Worry: Not on file    Inability: Not on file  . Transportation needs    Medical: Not on file    Non-medical: Not on file  Tobacco Use  . Smoking status: Former Smoker    Types: E-cigarettes    Quit date: 11/01/2011    Years since quitting: 7.5  . Smokeless tobacco: Never Used  . Tobacco comment: 02-06-2017 PER PT [facility].  Substance and Sexual Activity  . Alcohol use: No    Frequency: Never  . Drug use: No  . Sexual activity: Yes    Birth control/protection: Surgical    Comment: hysterectomy  Lifestyle  . Physical activity    Days per week: Not on file    Minutes per session: Not on file  . Stress: Not on file  Relationships  . Social Musicianconnections    Talks on phone: Not on file    Gets together: Not on file    Attends religious service: Not on file    Active member of club or organization: Not on file    Attends meetings of clubs or organizations: Not on file    Relationship status: Not on file  Other Topics Concern  . Not on file  Social History Narrative   Lives at home   Husband Shaun   Three daughters   Tries to exercise    Allergies:  Allergies  Allergen Reactions  . Ciprofloxacin Hcl Other (See Comments)    Silent seizures.  . Coppertone Kids Spf15 [Albolene] Hives and Swelling  . Vicodin [Hydrocodone-Acetaminophen] Other (See Comments)    Makes her feel drunk and she prefers not to take this.    Metabolic Disorder Labs: No results found for: HGBA1C, MPG No results found for: PROLACTIN Lab Results  Component Value Date   CHOL 148 12/26/2016   TRIG 30 12/26/2016   HDL 50 (L) 12/26/2016   CHOLHDL 3.0 12/26/2016   VLDL 6 12/26/2016   LDLCALC 92 12/26/2016   No results found for: TSH  Therapeutic Level Labs: No  results found for: LITHIUM No results found for: VALPROATE No components found for:  CBMZ  Current Medications: Current Outpatient Medications  Medication Sig Dispense Refill  . ibuprofen (ADVIL) 200 MG tablet Take 600 mg by mouth  daily as needed for mild pain.     Melene Muller. [START ON 06/09/2019] amphetamine-dextroamphetamine (ADDERALL XR) 30 MG 24 hr capsule Take 1 capsule (30 mg total) by mouth every morning. 30 capsule 0  . [START ON 07/09/2019] amphetamine-dextroamphetamine (ADDERALL XR) 30 MG 24 hr capsule Take 1 capsule (30 mg total) by mouth daily. 30 capsule 0  . [START ON 08/08/2019] amphetamine-dextroamphetamine (ADDERALL XR) 30 MG 24 hr capsule Take 1 capsule (30 mg total) by mouth daily. 30 capsule 0  . cephALEXin (KEFLEX) 500 MG capsule Take 1 capsule (500 mg total) by mouth 3 (three) times daily. (Patient not taking: Reported on 04/09/2019) 21 capsule 0   No current facility-administered medications for this visit.      Musculoskeletal: Strength & Muscle Tone: N/A Gait & Station: N/A Patient leans: N/A  Psychiatric Specialty Exam: Review of Systems  Psychiatric/Behavioral: Negative for depression, hallucinations, memory loss, substance abuse and suicidal ideas. The patient is nervous/anxious. The patient does not have insomnia.   All other systems reviewed and are negative.   Last menstrual period 08/31/2016.There is no height or weight on file to calculate BMI.  General Appearance: Fairly Groomed  Eye Contact:  Good  Speech:  Clear and Coherent  Volume:  Normal  Mood:  "good"  Affect:  Appropriate and Congruent  Thought Process:  Coherent  Orientation:  Full (Time, Place, and Person)  Thought Content: Logical   Suicidal Thoughts:  No  Homicidal Thoughts:  No  Memory:  Immediate;   Good  Judgement:  Good  Insight:  Good  Psychomotor Activity:  Normal  Concentration:  Concentration: Good and Attention Span: Good  Recall:  Good  Fund of Knowledge: Good  Language: Good   Akathisia:  No  Handed:  Right  AIMS (if indicated): not done  Assets:  Communication Skills Desire for Improvement  ADL's:  Intact  Cognition: WNL  Sleep:  Good   Screenings:   Assessment and Plan:  Arlan OrganDevin L Bertucci is a 33 y.o. year old female with a history of ADHD, who presents for follow up appointment for ADHD.   # ADHD (diagnosed with neuropsych test 05/2017) There has been improvement in inattention after up titration of Adderall.  Will continue current dose to target ADHD.  She has no known history of seizure except when she was on ciprofloxacin.  Discussed potential side effect of headache, insomnia, appetite loss, palpitation and hypertension.   Plan 1. Continue Adderall XR 30 mg daily   2. Next appointment: 10/30 at 11 AM for 20 mins, video - ximino (minocycline) for acne  The patient demonstrates the following risk factors for suicide: Chronic risk factors for suicide include: N/A. Acute risk factorsfor suicide include: N/A. Protective factorsfor this patient include: positive social support, responsibility to others (children, family), coping skills and hope for the future. Considering these factors, the overall suicide risk at this point appears to be low. Patient isappropriate for outpatient follow up.  Neysa Hottereina Amylia Collazos, MD 06/03/2019, 1:21 PM

## 2019-06-03 ENCOUNTER — Ambulatory Visit (INDEPENDENT_AMBULATORY_CARE_PROVIDER_SITE_OTHER): Payer: 59 | Admitting: Psychiatry

## 2019-06-03 ENCOUNTER — Encounter (HOSPITAL_COMMUNITY): Payer: Self-pay | Admitting: Psychiatry

## 2019-06-03 ENCOUNTER — Other Ambulatory Visit: Payer: Self-pay

## 2019-06-03 DIAGNOSIS — F902 Attention-deficit hyperactivity disorder, combined type: Secondary | ICD-10-CM | POA: Diagnosis not present

## 2019-06-03 MED ORDER — AMPHETAMINE-DEXTROAMPHET ER 30 MG PO CP24: 30.0000 mg | ORAL_CAPSULE | ORAL | 0 refills | Status: DC

## 2019-06-03 MED ORDER — AMPHETAMINE-DEXTROAMPHET ER 30 MG PO CP24: 30.0000 mg | ORAL_CAPSULE | Freq: Every day | ORAL | 0 refills | Status: DC

## 2019-06-03 NOTE — Patient Instructions (Signed)
1. Continue Adderall XR 30 mg daily   2. Next appointment: 10/30 at 11 AM

## 2019-07-10 ENCOUNTER — Other Ambulatory Visit (HOSPITAL_COMMUNITY): Payer: Self-pay | Admitting: Psychiatry

## 2019-07-10 ENCOUNTER — Telehealth (HOSPITAL_COMMUNITY): Payer: Self-pay | Admitting: *Deleted

## 2019-07-10 MED ORDER — LISDEXAMFETAMINE DIMESYLATE 30 MG PO CAPS: 30.0000 mg | ORAL_CAPSULE | Freq: Every day | ORAL | 0 refills | Status: DC

## 2019-07-10 NOTE — Telephone Encounter (Signed)
PATIENT HAS CALLED VERY UPSET WITH THE Rx. IT SEEM'S THAT [facility] HAS INCREASED THE COST OF ADDERALL. PATIENT HAS SPOKEN TO HER INSURANCE COMPANY & THEY STATED THEY HAVEN'T CHANGED ANY MEDICATION TIER THAT WOULD INCREASE COST. AND THE Rx CORPORATE OFFICE SAYS THEY HAVE INCREASED COST OF MED'S  @ ALL THEIR STORES. PATIENT HASN'T PICKED UP THE 2ND REFILL DUE TO THE CURRENT INCREASE & HAS ASKED TO BACK ON VYVANSE (WHICH IS CHEAPER & SHE HAS A COUPON)

## 2019-07-10 NOTE — Telephone Encounter (Signed)
Advise the patient to discontinue Adderall, and start Vyvanse 30 mg daily (medication ordered). Please contact the pharmacy to CANCEL adderall orders.

## 2019-07-10 NOTE — Telephone Encounter (Signed)
LVM PER PROVIDER: Advise the patient to discontinue Adderall, and start Vyvanse 30 mg daily (medication ordered).   DONE contacted the pharmacy to CANCEL adderall orders.

## 2019-08-26 ENCOUNTER — Other Ambulatory Visit: Payer: Self-pay

## 2019-08-26 DIAGNOSIS — Z20822 Contact with and (suspected) exposure to covid-19: Secondary | ICD-10-CM

## 2019-08-30 ENCOUNTER — Ambulatory Visit (HOSPITAL_COMMUNITY): Payer: 59 | Admitting: Psychiatry

## 2019-09-04 NOTE — Progress Notes (Signed)
BH MD/PA/NP OP Progress Note  09/12/2019 8:28 AM Alexis White  MRN:  [id number]  Chief Complaint:  Chief Complaint    ADHD; Follow-up     HPI:  - Since the last visit, vyvanse was started in replace of Adderall XR due to cost.   This is a follow-up appointment for ADHD.  She states that she has been busy.  Her mother move into the patient house, and she will likely stay there for another 6 months.  Although she feels stressed, she has been managing things well.  She has been busy working on projects, Psychologist, forensic, and preparing for Thanksgiving as her mother-in-law will visit their house.  She enjoys roller skating as a family activity.  She is unable to do regular exercise due to knee pain and back pain.  She denies insomnia.  She occasionally feels anxious due to her current situation of her mother moving in.  She has good concentration as long as she takes Vyvanse.  She has good appetite, and has been working on diet.  She feels good about the current weight.  She denies feeling depressed.  She denies SI. She wants to switch back to Adderall XR due to her insurance issues/cost.   Visit Diagnosis:    ICD-10-CM   1. Attention deficit hyperactivity disorder (ADHD), combined type  F90.2     Past Psychiatric History: Please see initial evaluation for full details. I have reviewed the history. No updates at this time.     Past Medical History:  Past Medical History:  Diagnosis Date  . Absence seizure (HCC)   . Acute kidney failure following labor and delivery   . Acute liver failure   . Allergy   . Anemia   . Asthma   . Complication of anesthesia   . Depression   . HELLP syndrome in first trimester 2009  . HELLP syndrome in first trimester   . Kidney infection   . PONV (postoperative nausea and vomiting)   . Seizures (HCC)     Past Surgical History:  Procedure Laterality Date  . ABDOMINAL HYSTERECTOMY N/A 08/31/2016   Procedure: ABDOMINAL HYSTERECTOMY WITH REMOVAL OF  CERVIX;  Surgeon: Lazaro Arms, MD;  Location: [facility];  Service: Gynecology;  Laterality: N/A;  . BREAST ENHANCEMENT SURGERY    . CESAREAN SECTION    . SCAR REVISION  08/31/2016   Procedure: REVISION OF ABDOMINAL WALL SCAR;  Surgeon: Lazaro Arms, MD;  Location: [facility];  Service: Gynecology;;  . TUBAL LIGATION      Family Psychiatric History: Please see initial evaluation for full details. I have reviewed the history. No updates at this time.     Family History:  Family History  Adopted: Yes  Problem Relation Age of Onset  . Heart disease Father 64       "code blue"  . Alcohol abuse Father   . Diabetes Father   . Cancer Maternal Aunt        skin  . Breast cancer Maternal Aunt   . Heart disease Maternal Aunt        CHF  . Cancer Paternal Grandfather        bladder  . Heart disease Maternal Grandmother   . Congestive Heart Failure Maternal Grandmother   . Breast cancer Maternal Aunt   . Cancer Maternal Aunt        skin  . Nevi Mother   . Heart disease Paternal Grandmother   . Diabetes Other   .  Heart disease Other     Social History:  Social History   Socioeconomic History  . Marital status: Married    Spouse name: Shaun  . Number of children: 3  . Years of education: 7313  . Highest education level: Not on file  Occupational History  . Occupation: massage therapy    Comment: by schooling  . Occupation: Games developerBake art- cakes    Comment: self  Social Needs  . Financial resource strain: Not on file  . Food insecurity    Worry: Not on file    Inability: Not on file  . Transportation needs    Medical: Not on file    Non-medical: Not on file  Tobacco Use  . Smoking status: Former Smoker    Types: E-cigarettes    Quit date: 11/01/2011    Years since quitting: 7.8  . Smokeless tobacco: Never Used  . Tobacco comment: 02-06-2017 PER PT [facility].  Substance and Sexual Activity  . Alcohol use: No    Frequency: Never  . Drug use: No  . Sexual activity: Yes    Birth  control/protection: Surgical    Comment: hysterectomy  Lifestyle  . Physical activity    Days per week: Not on file    Minutes per session: Not on file  . Stress: Not on file  Relationships  . Social Musicianconnections    Talks on phone: Not on file    Gets together: Not on file    Attends religious service: Not on file    Active member of club or organization: Not on file    Attends meetings of clubs or organizations: Not on file    Relationship status: Not on file  Other Topics Concern  . Not on file  Social History Narrative   Lives at home   Husband Shaun   Three daughters   Tries to exercise    Allergies:  Allergies  Allergen Reactions  . Ciprofloxacin Hcl Other (See Comments)    Silent seizures.  . Coppertone Kids Spf15 [Albolene] Hives and Swelling  . Vicodin [Hydrocodone-Acetaminophen] Other (See Comments)    Makes her feel drunk and she prefers not to take this.    Metabolic Disorder Labs: No results found for: HGBA1C, MPG No results found for: PROLACTIN Lab Results  Component Value Date   CHOL 148 12/26/2016   TRIG 30 12/26/2016   HDL 50 (L) 12/26/2016   CHOLHDL 3.0 12/26/2016   VLDL 6 12/26/2016   LDLCALC 92 12/26/2016   No results found for: TSH  Therapeutic Level Labs: No results found for: LITHIUM No results found for: VALPROATE No components found for:  CBMZ  Current Medications: Current Outpatient Medications  Medication Sig Dispense Refill  . minocycline (MINOCIN) 100 MG capsule Take 100 mg by mouth 2 (two) times daily.    Marland Kitchen. amphetamine-dextroamphetamine (ADDERALL XR) 30 MG 24 hr capsule Take 1 capsule (30 mg total) by mouth every morning. 30 capsule 0  . amphetamine-dextroamphetamine (ADDERALL XR) 30 MG 24 hr capsule Take 1 capsule (30 mg total) by mouth daily. 30 capsule 0  . [START ON 10/10/2019] amphetamine-dextroamphetamine (ADDERALL XR) 30 MG 24 hr capsule Take 1 capsule (30 mg total) by mouth daily. 30 capsule 0  . [START ON 11/08/2019]  amphetamine-dextroamphetamine (ADDERALL XR) 30 MG 24 hr capsule Take 1 capsule (30 mg total) by mouth daily. 30 capsule 0  . cephALEXin (KEFLEX) 500 MG capsule Take 1 capsule (500 mg total) by mouth 3 (three) times daily. (Patient not  taking: Reported on 04/09/2019) 21 capsule 0  . ibuprofen (ADVIL) 200 MG tablet Take 600 mg by mouth daily as needed for mild pain.      No current facility-administered medications for this visit.      Musculoskeletal: Strength & Muscle Tone: N/A Gait & Station: N/A Patient leans: N/A  Psychiatric Specialty Exam: Review of Systems  Psychiatric/Behavioral: Negative for depression, hallucinations, memory loss, substance abuse and suicidal ideas. The patient is nervous/anxious. The patient does not have insomnia.   All other systems reviewed and are negative.   Last menstrual period 08/31/2016.There is no height or weight on file to calculate BMI.  General Appearance: Fairly Groomed  Eye Contact:  Good  Speech:  Clear and Coherent  Volume:  Normal  Mood:  "good"  Affect:  Appropriate, Congruent and Full Range  Thought Process:  Coherent  Orientation:  Full (Time, Place, and Person)  Thought Content: Logical   Suicidal Thoughts:  No  Homicidal Thoughts:  No  Memory:  Immediate;   Good  Judgement:  Good  Insight:  Good  Psychomotor Activity:  Normal  Concentration:  Concentration: Good and Attention Span: Good  Recall:  Good  Fund of Knowledge: Good  Language: Good  Akathisia:  No  Handed:  Right  AIMS (if indicated): not done  Assets:  Communication Skills Desire for Improvement  ADL's:  Intact  Cognition: WNL  Sleep:  Good   Screenings:   Assessment and Plan:  SADEY YANDELL is a 33 y.o. year old female with a history of ADHD, who presents for follow up appointment for Attention deficit hyperactivity disorder (ADHD), combined type   # ADHD (diagnosed with neuropsych test 05/2017) She has had good response to Vyvanse, and denies any  attention issues.  However, because of her insurance issues, she will need to switch back to Adderall XR, which has been also effective to the patient.  Will reinitiate Adderall XR to target ADHD.  She has no known history of seizure except when she was on ciprofloxacin.  Discussed potential side effect, which includes but not limited to insomnia, decreased appetite, palpitation and hypertension.   Plan 1. Continue Adderall XR 30 mg daily  2. Next appointment: in 3 months  The patient demonstrates the following risk factors for suicide: Chronic risk factors for suicide include: N/A. Acute risk factorsfor suicide include: N/A. Protective factorsfor this patient include: positive social support, responsibility to others (children, family), coping skills and hope for the future. Considering these factors, the overall suicide risk at this point appears to be low. Patient isappropriate for outpatient follow up.  Norman Clay, MD  Norman Clay, MD 09/12/2019, 8:28 AM

## 2019-09-12 ENCOUNTER — Other Ambulatory Visit: Payer: Self-pay

## 2019-09-12 ENCOUNTER — Encounter (HOSPITAL_COMMUNITY): Payer: Self-pay | Admitting: Psychiatry

## 2019-09-12 ENCOUNTER — Ambulatory Visit (INDEPENDENT_AMBULATORY_CARE_PROVIDER_SITE_OTHER): Payer: 59 | Admitting: Psychiatry

## 2019-09-12 DIAGNOSIS — F902 Attention-deficit hyperactivity disorder, combined type: Secondary | ICD-10-CM | POA: Diagnosis not present

## 2019-09-12 MED ORDER — AMPHETAMINE-DEXTROAMPHET ER 30 MG PO CP24: 30.0000 mg | ORAL_CAPSULE | Freq: Every day | ORAL | 0 refills | Status: DC

## 2019-11-04 ENCOUNTER — Encounter: Payer: Self-pay | Admitting: Obstetrics & Gynecology

## 2019-11-04 ENCOUNTER — Ambulatory Visit (INDEPENDENT_AMBULATORY_CARE_PROVIDER_SITE_OTHER): Payer: 59 | Admitting: Obstetrics & Gynecology

## 2019-11-04 ENCOUNTER — Other Ambulatory Visit: Payer: Self-pay

## 2019-11-04 VITALS — BP 125/84 | HR 96 | Ht 63.0 in | Wt 155.0 lb

## 2019-11-04 DIAGNOSIS — R1032 Left lower quadrant pain: Secondary | ICD-10-CM | POA: Diagnosis not present

## 2019-11-04 NOTE — Progress Notes (Signed)
Trigger Point Injection   Pre-operative diagnosis: ACNES left ilioinguinal nerve  Post-operative diagnosis: smae  After risks and benefits were explained including bleeding, infection, worsening of the pain, damage to the area being injected, weakness, allergic reaction to medications, vascular injection, and nerve damage, signed consent was obtained.  All questions were answered.    The area of the trigger point was identified and the skin prepped three times with alcohol and the alcohol allowed to dry.  Next, a 25 gauge 0.5 inch needle was placed in the area of the trigger point.  Once reproduction of the pain was elicited and negative aspiration confirmed, the trigger point was injected and the needle removed.    The patient did tolerate the procedure well and there were not complications.    Medication used:20 cc 0.5% marcaine plain  Trigger points injected: 1  Medial distribution of hte left ilioinguinal nerve  Trigger point(s) location(s):  left

## 2019-11-25 ENCOUNTER — Other Ambulatory Visit: Payer: Self-pay

## 2019-11-25 ENCOUNTER — Ambulatory Visit: Payer: 59 | Attending: Internal Medicine

## 2019-11-25 DIAGNOSIS — Z20822 Contact with and (suspected) exposure to covid-19: Secondary | ICD-10-CM

## 2019-11-26 LAB — NOVEL CORONAVIRUS, NAA: SARS-CoV-2, NAA: NOT DETECTED

## 2019-12-03 ENCOUNTER — Ambulatory Visit (INDEPENDENT_AMBULATORY_CARE_PROVIDER_SITE_OTHER): Payer: 59 | Admitting: Psychiatry

## 2019-12-03 ENCOUNTER — Encounter: Payer: Self-pay | Admitting: Psychiatry

## 2019-12-03 ENCOUNTER — Other Ambulatory Visit: Payer: Self-pay

## 2019-12-03 DIAGNOSIS — F902 Attention-deficit hyperactivity disorder, combined type: Secondary | ICD-10-CM | POA: Diagnosis not present

## 2019-12-03 MED ORDER — AMPHETAMINE-DEXTROAMPHET ER 30 MG PO CP24: 30.0000 mg | ORAL_CAPSULE | ORAL | 0 refills | Status: DC

## 2019-12-03 NOTE — Progress Notes (Signed)
BH MD OP Progress Note  I connected with  Alexis White on 12/03/19 by a video enabled telemedicine application and verified that I am speaking with the correct person using two identifiers.   I discussed the limitations of evaluation and management by telemedicine. The patient expressed understanding and agreed to proceed.    12/03/2019 8:28 AM Alexis White  MRN:  [id number]  Chief Complaint: " Everything is going well."  HPI: Patient reported that everything is going well for her.  She stated that Adderall XR 30 mg dose is working great for her.  She is sleeping well.  She denied any issues pertaining to her appetite.  She denied any acute concerns at this time. 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  Visit Diagnosis:    ICD-10-CM   1. Attention deficit hyperactivity disorder (ADHD), combined type  F90.2     Past Psychiatric History: ADHD  Past Medical History:  Past Medical History:  Diagnosis Date  . Absence seizure (HCC)   . Acute kidney failure following labor and delivery   . Acute liver failure   . Allergy   . Anemia   . Asthma   . Complication of anesthesia   . Depression   . HELLP syndrome in first trimester 2009  . HELLP syndrome in first trimester   . Kidney infection   . PONV (postoperative nausea and vomiting)   . Seizures (HCC)     Past Surgical History:  Procedure Laterality Date  . ABDOMINAL HYSTERECTOMY N/A 08/31/2016   Procedure: ABDOMINAL HYSTERECTOMY WITH REMOVAL OF CERVIX;  Surgeon: Lazaro Arms, MD;  Location: [facility];  Service: Gynecology;  Laterality: N/A;  . BREAST ENHANCEMENT SURGERY    . CESAREAN SECTION    . SCAR REVISION  08/31/2016   Procedure: REVISION OF ABDOMINAL WALL SCAR;  Surgeon: Lazaro Arms, MD;  Location: [facility];  Service: Gynecology;;  . TUBAL LIGATION      Family Psychiatric History: see below  Family History:  Family History  Adopted: Yes  Problem  Relation Age of Onset  . Heart disease Father 65       "code blue"  . Alcohol abuse Father   . Diabetes Father   . Cancer Maternal Aunt        skin  . Breast cancer Maternal Aunt   . Heart disease Maternal Aunt        CHF  . Cancer Paternal Grandfather        bladder  . Heart disease Maternal Grandmother   . Congestive Heart Failure Maternal Grandmother   . Breast cancer Maternal Aunt   . Cancer Maternal Aunt        skin  . Nevi Mother   . Heart disease Paternal Grandmother   . Diabetes Other   . Heart disease Other     Social History:  Social History   Socioeconomic History  . Marital status: Married    Spouse name: Shaun  . Number of children: 3  . Years of education: 36  . Highest education level: Not on file  Occupational History  . Occupation: massage therapy    Comment: by schooling  . Occupation: Bake art- cakes    Comment: self  Tobacco Use  . Smoking status: Former Smoker    Types: E-cigarettes    Quit date: 11/01/2011    Years since quitting: 8.0  . Smokeless tobacco: Never Used  .  Tobacco comment: 02-06-2017 PER PT [facility].  Substance and Sexual Activity  . Alcohol use: No  . Drug use: No  . Sexual activity: Yes    Birth control/protection: Surgical    Comment: hysterectomy  Other Topics Concern  . Not on file  Social History Narrative   Lives at home   Husband Shaun   Three daughters   Tillie Rung to exercise   Social Determinants of Health   Financial Resource Strain:   . Difficulty of Paying Living Expenses: Not on file  Food Insecurity:   . Worried About Programme researcher, broadcasting/film/video in the Last Year: Not on file  . Ran Out of Food in the Last Year: Not on file  Transportation Needs:   . Lack of Transportation (Medical): Not on file  . Lack of Transportation (Non-Medical): Not on file  Physical Activity:   . Days of Exercise per Week: Not on file  . Minutes of Exercise per Session: Not on file  Stress:   . Feeling of Stress : Not on file  Social  Connections:   . Frequency of Communication with Friends and Family: Not on file  . Frequency of Social Gatherings with Friends and Family: Not on file  . Attends Religious Services: Not on file  . Active Member of Clubs or Organizations: Not on file  . Attends Banker Meetings: Not on file  . Marital Status: Not on file    Allergies:  Allergies  Allergen Reactions  . Ciprofloxacin Hcl Other (See Comments)    Silent seizures.  . Coppertone Kids Spf15 [Albolene] Hives and Swelling  . Vicodin [Hydrocodone-Acetaminophen] Other (See Comments)    Makes her feel drunk and she prefers not to take this.    Metabolic Disorder Labs: No results found for: HGBA1C, MPG No results found for: PROLACTIN Lab Results  Component Value Date   CHOL 148 12/26/2016   TRIG 30 12/26/2016   HDL 50 (L) 12/26/2016   CHOLHDL 3.0 12/26/2016   VLDL 6 12/26/2016   LDLCALC 92 12/26/2016   No results found for: TSH  Therapeutic Level Labs: No results found for: LITHIUM No results found for: VALPROATE No components found for:  CBMZ  Current Medications: Current Outpatient Medications  Medication Sig Dispense Refill  . amphetamine-dextroamphetamine (ADDERALL XR) 30 MG 24 hr capsule Take 1 capsule (30 mg total) by mouth every morning. 30 capsule 0  . amphetamine-dextroamphetamine (ADDERALL XR) 30 MG 24 hr capsule Take 1 capsule (30 mg total) by mouth daily. 30 capsule 0  . amphetamine-dextroamphetamine (ADDERALL XR) 30 MG 24 hr capsule Take 1 capsule (30 mg total) by mouth daily. 30 capsule 0  . amphetamine-dextroamphetamine (ADDERALL XR) 30 MG 24 hr capsule Take 1 capsule (30 mg total) by mouth daily. 30 capsule 0  . cephALEXin (KEFLEX) 500 MG capsule Take 1 capsule (500 mg total) by mouth 3 (three) times daily. (Patient not taking: Reported on 04/09/2019) 21 capsule 0  . ibuprofen (ADVIL) 200 MG tablet Take 600 mg by mouth daily as needed for mild pain.     . minocycline (MINOCIN) 100 MG  capsule Take 100 mg by mouth 2 (two) times daily.     No current facility-administered medications for this visit.      Psychiatric Specialty Exam: Review of Systems  There were no vitals taken for this visit.There is no height or weight on file to calculate BMI.  General Appearance: Well Groomed  Eye Contact:  Good  Speech:  Clear and  Coherent and Normal Rate  Volume:  Normal  Mood:  Euthymic  Affect:  Congruent  Thought Process:  Goal Directed, Linear and Descriptions of Associations: Intact  Orientation:  Full (Time, Place, and Person)  Thought Content: Logical   Suicidal Thoughts:  No  Homicidal Thoughts:  No  Memory:  Recent;   Good Remote;   Good  Judgement:  Good  Insight:  Good  Psychomotor Activity:  Normal  Concentration:  Concentration: Good and Attention Span: Good  Recall:  Good  Fund of Knowledge: Good  Language: Good  Akathisia:  Negative  Handed:  Right  AIMS (if indicated): not done  Assets:  Communication Skills Desire for Improvement Financial Resources/Insurance Housing  ADL's:  Intact  Cognition: WNL  Sleep:  Good     Assessment and Plan: 34 y.o. year old female with a history of ADHD noe see for f/up via telemed, reported doing well on Adderall XR 30 mg qam.  1. Attention deficit hyperactivity disorder (ADHD), combined type  - amphetamine-dextroamphetamine (ADDERALL XR) 30 MG 24 hr capsule; Take 1 capsule (30 mg total) by mouth every morning.  Dispense: 30 capsule; Refill: 0 - amphetamine-dextroamphetamine (ADDERALL XR) 30 MG 24 hr capsule; Take 1 capsule (30 mg total) by mouth every morning.  Dispense: 30 capsule; Refill: 0 - amphetamine-dextroamphetamine (ADDERALL XR) 30 MG 24 hr capsule; Take 1 capsule (30 mg total) by mouth every morning.  Dispense: 30 capsule; Refill: 0   Continue same medication regimen. Follow up in 3 months.    [place] Crane, MD 12/03/2019, 8:28 AM

## 2020-02-21 ENCOUNTER — Other Ambulatory Visit: Payer: Self-pay

## 2020-02-21 ENCOUNTER — Ambulatory Visit: Payer: 59 | Attending: Internal Medicine

## 2020-02-21 DIAGNOSIS — Z20822 Contact with and (suspected) exposure to covid-19: Secondary | ICD-10-CM

## 2020-02-22 LAB — SARS-COV-2, NAA 2 DAY TAT

## 2020-02-22 LAB — NOVEL CORONAVIRUS, NAA: SARS-CoV-2, NAA: NOT DETECTED
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
## 2020-03-03 ENCOUNTER — Telehealth (INDEPENDENT_AMBULATORY_CARE_PROVIDER_SITE_OTHER): Payer: 59 | Admitting: Psychiatry

## 2020-03-03 ENCOUNTER — Encounter (HOSPITAL_COMMUNITY): Payer: Self-pay | Admitting: Psychiatry

## 2020-03-03 ENCOUNTER — Other Ambulatory Visit: Payer: Self-pay

## 2020-03-03 DIAGNOSIS — F902 Attention-deficit hyperactivity disorder, combined type: Secondary | ICD-10-CM

## 2020-03-03 DIAGNOSIS — F4322 Adjustment disorder with anxiety: Secondary | ICD-10-CM | POA: Diagnosis not present

## 2020-03-03 MED ORDER — AMPHETAMINE-DEXTROAMPHET ER 30 MG PO CP24: 30.0000 mg | ORAL_CAPSULE | ORAL | 0 refills | Status: DC

## 2020-03-03 MED ORDER — AMPHETAMINE-DEXTROAMPHET ER 30 MG PO CP24: 30.0000 mg | ORAL_CAPSULE | Freq: Every day | ORAL | 0 refills | Status: DC

## 2020-03-03 NOTE — Patient Instructions (Signed)
1. Continue Adderall XR30mg  daily   2. Next appointment: 8/2 at 9:10

## 2020-05-26 NOTE — Progress Notes (Signed)
Virtual Visit via Video Note  I connected with Alexis White on 06/01/20 at  1:20 PM EDT by a video enabled telemedicine application and verified that I am speaking with the correct person using two identifiers.   I discussed the limitations of evaluation and management by telemedicine and the availability of in person appointments. The patient expressed understanding and agreed to proceed.     I discussed the assessment and treatment plan with the patient. The patient was provided an opportunity to ask questions and all were answered. The patient agreed with the plan and demonstrated an understanding of the instructions.   The patient was advised to call back or seek an in-person evaluation if the symptoms worsen or if the condition fails to improve as anticipated.  Location: patient- home, provider- office   I provided 12 minutes of non-face-to-face time during this encounter.   Neysa Hottereina Malik Paar, MD    [facility] MD/[facility]/NP OP Progress Note  06/01/2020 2:19 PM Alexis White  MRN:  [id number]  Chief Complaint:  Chief Complaint    ADD; Follow-up     HPI:  This is a follow-up appointment for ADHD.  She states that things has been wonderful.  Her mother moved out eventually.  She states that everything is back to normal since then.  She also reports that her husband's hypertension has resolved, and her kids are doing better.  She states that she now learned how to interact with narcissist, reporting that her mother used to do gas lighting.  She now has a relationship with her sibling as "siblings,"not as caretaker anymore.  She is taking the summer off, and working on remodeling.  She denies any issues with attention.  She has been able to do multitasking, and is able to complete tasks.  She denies insomnia.  She denies feeling depressed.  She denies anxiety.  She has good appetite.  She denies any side effect from Adderall .  150-155 lbs drop to 140 lbs Wt Readings from Last 3 Encounters:   11/04/19 155 lb (70.3 kg)  04/09/19 155 lb (70.3 kg)  01/09/19 151 lb 12.8 oz (68.9 kg)    Visit Diagnosis:    ICD-10-CM   1. Attention deficit hyperactivity disorder (ADHD), predominantly inattentive type  F90.0     Past Psychiatric History: Please see initial evaluation for full details. I have reviewed the history. No updates at this time.     Past Medical History:  Past Medical History:  Diagnosis Date  . Absence seizure ([facility])   . Acute kidney failure following labor and delivery   . Acute liver failure   . Allergy   . Anemia   . Asthma   . Complication of anesthesia   . Depression   . HELLP syndrome in first trimester 2009  . HELLP syndrome in first trimester   . Kidney infection   . PONV (postoperative nausea and vomiting)   . Seizures ([facility])     Past Surgical History:  Procedure Laterality Date  . ABDOMINAL HYSTERECTOMY N/A 08/31/2016   Procedure: ABDOMINAL HYSTERECTOMY WITH REMOVAL OF CERVIX;  Surgeon: [name], MD;  Location: [facility];  Service: Gynecology;  Laterality: N/A;  . BREAST ENHANCEMENT SURGERY    . CESAREAN SECTION    . SCAR REVISION  08/31/2016   Procedure: REVISION OF ABDOMINAL WALL SCAR;  Surgeon: [name], MD;  Location: [facility];  Service: Gynecology;;  . TUBAL LIGATION      Family Psychiatric History: Please see  initial evaluation for full details. I have reviewed the history. No updates at this time.     Family History:  Family History  Adopted: Yes  Problem Relation Age of Onset  . Heart disease Father 35       "code blue"  . Alcohol abuse Father   . Diabetes Father   . Cancer Maternal Aunt        skin  . Breast cancer Maternal Aunt   . Heart disease Maternal Aunt        CHF  . Cancer Paternal Grandfather        bladder  . Heart disease Maternal Grandmother   . Congestive Heart Failure Maternal Grandmother   . Breast cancer Maternal Aunt   . Cancer Maternal Aunt        skin  . Nevi Mother   . Heart disease Paternal  Grandmother   . Diabetes Other   . Heart disease Other     Social History:  Social History   Socioeconomic History  . Marital status: Married    Spouse name: Shaun  . Number of children: 3  . Years of education: 52  . Highest education level: Not on file  Occupational History  . Occupation: massage therapy    Comment: by schooling  . Occupation: Bake art- cakes    Comment: self  Tobacco Use  . Smoking status: Former Smoker    Types: E-cigarettes    Quit date: 11/01/2011    Years since quitting: 8.5  . Smokeless tobacco: Never Used  . Tobacco comment: 02-06-2017 PER PT [facility].  Vaping Use  . Vaping Use: Every day  Substance and Sexual Activity  . Alcohol use: No  . Drug use: No  . Sexual activity: Yes    Birth control/protection: Surgical    Comment: hysterectomy  Other Topics Concern  . Not on file  Social History Narrative   Lives at home   Husband Shaun   Three daughters   Tries to exercise   Social Determinants of Health   Financial Resource Strain:   . Difficulty of Paying Living Expenses:   Food Insecurity:   . Worried About Programme researcher, broadcasting/film/video in the Last Year:   . Barista in the Last Year:   Transportation Needs:   . Freight forwarder (Medical):   Marland Kitchen Lack of Transportation (Non-Medical):   Physical Activity:   . Days of Exercise per Week:   . Minutes of Exercise per Session:   Stress:   . Feeling of Stress :   Social Connections:   . Frequency of Communication with Friends and Family:   . Frequency of Social Gatherings with Friends and Family:   . Attends Religious Services:   . Active Member of Clubs or Organizations:   . Attends Banker Meetings:   Marland Kitchen Marital Status:     Allergies:  Allergies  Allergen Reactions  . Ciprofloxacin Hcl Other (See Comments)    Silent seizures.  . Coppertone Kids Spf15 [Albolene] Hives and Swelling  . Vicodin [Hydrocodone-Acetaminophen] Other (See Comments)    Makes her feel drunk and she  prefers not to take this.    Metabolic Disorder Labs: No results found for: HGBA1C, MPG No results found for: PROLACTIN Lab Results  Component Value Date   CHOL 148 12/26/2016   TRIG 30 12/26/2016   HDL 50 (L) 12/26/2016   CHOLHDL 3.0 12/26/2016   VLDL 6 12/26/2016   LDLCALC 92 12/26/2016  No results found for: TSH  Therapeutic Level Labs: No results found for: LITHIUM No results found for: VALPROATE No components found for:  CBMZ  Current Medications: Current Outpatient Medications  Medication Sig Dispense Refill  . amphetamine-dextroamphetamine (ADDERALL XR) 30 MG 24 hr capsule Take 1 capsule (30 mg total) by mouth every morning. 30 capsule 0  . amphetamine-dextroamphetamine (ADDERALL XR) 30 MG 24 hr capsule Take 1 capsule (30 mg total) by mouth daily. 30 capsule 0  . amphetamine-dextroamphetamine (ADDERALL XR) 30 MG 24 hr capsule Take 1 capsule (30 mg total) by mouth every morning. 30 capsule 0  . amphetamine-dextroamphetamine (ADDERALL XR) 30 MG 24 hr capsule Take 1 capsule (30 mg total) by mouth daily. 30 capsule 0  . [START ON 07/01/2020] amphetamine-dextroamphetamine (ADDERALL XR) 30 MG 24 hr capsule Take 1 capsule (30 mg total) by mouth daily. 30 capsule 0  . [START ON 07/31/2020] amphetamine-dextroamphetamine (ADDERALL XR) 30 MG 24 hr capsule Take 1 capsule (30 mg total) by mouth daily. 30 capsule 0  . cephALEXin (KEFLEX) 500 MG capsule Take 1 capsule (500 mg total) by mouth 3 (three) times daily. (Patient not taking: Reported on 04/09/2019) 21 capsule 0  . ibuprofen (ADVIL) 200 MG tablet Take 600 mg by mouth daily as needed for mild pain.     . minocycline (MINOCIN) 100 MG capsule Take 100 mg by mouth 2 (two) times daily.     No current facility-administered medications for this visit.     Musculoskeletal: Strength & Muscle Tone: N/A Gait & Station: N/A Patient leans: N/A  Psychiatric Specialty Exam: Review of Systems  Psychiatric/Behavioral: Negative for  agitation, behavioral problems, confusion, decreased concentration, dysphoric mood, hallucinations, self-injury, sleep disturbance and suicidal ideas. The patient is not nervous/anxious and is not hyperactive.   All other systems reviewed and are negative.   Last menstrual period 08/31/2016.There is no height or weight on file to calculate BMI.  General Appearance: Fairly Groomed  Eye Contact:  Good  Speech:  Clear and Coherent  Volume:  Normal  Mood:  good  Affect:  Appropriate, Congruent and Full Range  Thought Process:  Coherent  Orientation:  Full (Time, Place, and Person)  Thought Content: Logical   Suicidal Thoughts:  No  Homicidal Thoughts:  No  Memory:  Immediate;   Good  Judgement:  Good  Insight:  Good  Psychomotor Activity:  Normal  Concentration:  Concentration: Good and Attention Span: Good  Recall:  Good  Fund of Knowledge: Good  Language: Good  Akathisia:  No  Handed:  Right  AIMS (if indicated): not done  Assets:  Communication Skills Desire for Improvement  ADL's:  Intact  Cognition: WNL  Sleep:  Good   Screenings:   Assessment and Plan:  Alexis White is a 34 y.o. year old female with a history of ADHD , who presents for follow up appointment for below.   1. Attention deficit hyperactivity disorder (ADHD), predominantly inattentive type (diagnosed with neuropsych test 05/2017) She reports good benefit from Adderall, and denies any issues with inattention.  Will continue current dose to target ADHD.    # Adjustment disorder with anxiety There has been significant improvement in her anxiety since her mother moved out.  No psychotropics is indicated at this time.   Plan 1. Continue Adderall XR30mg  daily   2. Next appointment: 10/25 at 9:10 for 20 mins, video  The patient demonstrates the following risk factors for suicide: Chronic risk factors for suicide include: N/A. Acute  risk factorsfor suicide include: N/A. Protective factorsfor this  patient include: positive social support, responsibility to others (children, family), coping skills and hope for the future. Considering these factors, the overall suicide risk at this point appears to be low. Patient isappropriate for outpatient follow up.   [name], MD 06/01/2020, 2:19 PM

## 2020-06-01 ENCOUNTER — Telehealth (INDEPENDENT_AMBULATORY_CARE_PROVIDER_SITE_OTHER): Payer: 59 | Admitting: Psychiatry

## 2020-06-01 ENCOUNTER — Encounter (HOSPITAL_COMMUNITY): Payer: Self-pay | Admitting: Psychiatry

## 2020-06-01 ENCOUNTER — Other Ambulatory Visit: Payer: Self-pay

## 2020-06-01 DIAGNOSIS — F900 Attention-deficit hyperactivity disorder, predominantly inattentive type: Secondary | ICD-10-CM | POA: Diagnosis not present

## 2020-06-01 MED ORDER — AMPHETAMINE-DEXTROAMPHET ER 30 MG PO CP24: 30.0000 mg | ORAL_CAPSULE | Freq: Every day | ORAL | 0 refills | Status: DC

## 2020-06-01 NOTE — Patient Instructions (Signed)
1. Continue Adderall XR30mg  daily   2. Next appointment: 10/25 at 9:10

## 2020-07-21 ENCOUNTER — Other Ambulatory Visit: Payer: Self-pay

## 2020-07-21 ENCOUNTER — Ambulatory Visit
Admission: EM | Admit: 2020-07-21 | Discharge: 2020-07-21 | Disposition: A | Discharge status: Home / Self Care | Payer: 59 | Attending: Family Medicine | Admitting: Family Medicine

## 2020-07-21 ENCOUNTER — Encounter: Payer: Self-pay | Admitting: Emergency Medicine

## 2020-07-21 DIAGNOSIS — Z1152 Encounter for screening for COVID-19: Secondary | ICD-10-CM

## 2020-07-21 DIAGNOSIS — B349 Viral infection, unspecified: Secondary | ICD-10-CM | POA: Diagnosis not present

## 2020-07-21 DIAGNOSIS — R0981 Nasal congestion: Secondary | ICD-10-CM

## 2020-07-21 DIAGNOSIS — H9202 Otalgia, left ear: Secondary | ICD-10-CM

## 2020-07-21 MED ORDER — CETIRIZINE-PSEUDOEPHEDRINE ER 5-120 MG PO TB12
1.0000 | ORAL_TABLET | Freq: Every day | ORAL | 0 refills | Status: DC
Start: 2020-07-21 — End: 2021-11-30

## 2020-07-21 MED ORDER — FLUTICASONE PROPIONATE 50 MCG/ACT NA SUSP
2.0000 | Freq: Every day | NASAL | 0 refills | Status: DC
Start: 2020-07-21 — End: 2021-11-30

## 2020-07-21 NOTE — Discharge Instructions (Addendum)
[facility] sinus rinse  Prescribed zyrtec D one tablet daily  Prescribed flonase 2 sprays per nostril   Your COVID test is pending.  You should self quarantine until the test result is back.    Take Tylenol as needed for fever or discomfort.  Rest and keep yourself hydrated.    Go to the emergency department if you develop acute worsening symptoms.

## 2020-07-21 NOTE — ED Triage Notes (Signed)
Patient c/o of bilateral ear pain, sinus headache- congestion x 1 week

## 2020-07-22 NOTE — ED Provider Notes (Signed)
[facility]   [id number] 07/21/20 Arrival Time: 1745   CC: COVID symptoms  SUBJECTIVE: History from: patient.  Alexis White is a 34 y.o. female who presents with abrupt onset of nasal congestion, PND, bilateral ear pain, sinus headache and persistent dry cough for 1 week. Denies sick exposure to COVID, flu or strep. Denies recent travel. Has negative history of Covid. Has not completed Covid vaccines. Has not taken OTC medications for this. There are no aggravating or alleviating factors. Denies previous symptoms in the past. Denies fever, chills, fatigue, rhinorrhea, sore throat, SOB, wheezing, chest pain, nausea, changes in bowel or bladder habits.    ROS: As per HPI.  All other pertinent ROS negative.     Past Medical History:  Diagnosis Date  . Absence seizure (HCC)   . Acute kidney failure following labor and delivery   . Acute liver failure   . Allergy   . Anemia   . Asthma   . Complication of anesthesia   . Depression   . HELLP syndrome in first trimester 2009  . HELLP syndrome in first trimester   . Kidney infection   . PONV (postoperative nausea and vomiting)   . Seizures (HCC)    Past Surgical History:  Procedure Laterality Date  . ABDOMINAL HYSTERECTOMY N/A 08/31/2016   Procedure: ABDOMINAL HYSTERECTOMY WITH REMOVAL OF CERVIX;  Surgeon: Lazaro Arms, MD;  Location: [facility];  Service: Gynecology;  Laterality: N/A;  . BREAST ENHANCEMENT SURGERY    . CESAREAN SECTION    . SCAR REVISION  08/31/2016   Procedure: REVISION OF ABDOMINAL WALL SCAR;  Surgeon: Lazaro Arms, MD;  Location: [facility];  Service: Gynecology;;  . TUBAL LIGATION     Allergies  Allergen Reactions  . Ciprofloxacin Hcl Other (See Comments)    Silent seizures.  . Coppertone Kids Spf15 [Albolene] Hives and Swelling  . Vicodin [Hydrocodone-Acetaminophen] Other (See Comments)    Makes her feel drunk and she prefers not to take this.   No current facility-administered medications on file  prior to encounter.   Current Outpatient Medications on File Prior to Encounter  Medication Sig Dispense Refill  . amphetamine-dextroamphetamine (ADDERALL XR) 30 MG 24 hr capsule Take 1 capsule (30 mg total) by mouth every morning. 30 capsule 0  . amphetamine-dextroamphetamine (ADDERALL XR) 30 MG 24 hr capsule Take 1 capsule (30 mg total) by mouth daily. 30 capsule 0  . amphetamine-dextroamphetamine (ADDERALL XR) 30 MG 24 hr capsule Take 1 capsule (30 mg total) by mouth every morning. 30 capsule 0  . amphetamine-dextroamphetamine (ADDERALL XR) 30 MG 24 hr capsule Take 1 capsule (30 mg total) by mouth daily. 30 capsule 0  . amphetamine-dextroamphetamine (ADDERALL XR) 30 MG 24 hr capsule Take 1 capsule (30 mg total) by mouth daily. 30 capsule 0  . [START ON 07/31/2020] amphetamine-dextroamphetamine (ADDERALL XR) 30 MG 24 hr capsule Take 1 capsule (30 mg total) by mouth daily. 30 capsule 0  . cephALEXin (KEFLEX) 500 MG capsule Take 1 capsule (500 mg total) by mouth 3 (three) times daily. (Patient not taking: Reported on 04/09/2019) 21 capsule 0  . ibuprofen (ADVIL) 200 MG tablet Take 600 mg by mouth daily as needed for mild pain.     . minocycline (MINOCIN) 100 MG capsule Take 100 mg by mouth 2 (two) times daily.     Social History   Socioeconomic History  . Marital status: Married    Spouse name: Shaun  . Number of children: 3  .  Years of education: 73  . Highest education level: Not on file  Occupational History  . Occupation: massage therapy    Comment: by schooling  . Occupation: Bake art- cakes    Comment: self  Tobacco Use  . Smoking status: Former Smoker    Types: E-cigarettes    Quit date: 11/01/2011    Years since quitting: 8.7  . Smokeless tobacco: Never Used  . Tobacco comment: 02-06-2017 PER PT [facility].  Vaping Use  . Vaping Use: Every day  Substance and Sexual Activity  . Alcohol use: No  . Drug use: No  . Sexual activity: Yes    Birth control/protection: Surgical     Comment: hysterectomy  Other Topics Concern  . Not on file  Social History Narrative   Lives at home   Husband Shaun   Three daughters   Tillie Rung to exercise   Social Determinants of Health   Financial Resource Strain:   . Difficulty of Paying Living Expenses: Not on file  Food Insecurity:   . Worried About Programme researcher, broadcasting/film/video in the Last Year: Not on file  . Ran Out of Food in the Last Year: Not on file  Transportation Needs:   . Lack of Transportation (Medical): Not on file  . Lack of Transportation (Non-Medical): Not on file  Physical Activity:   . Days of Exercise per Week: Not on file  . Minutes of Exercise per Session: Not on file  Stress:   . Feeling of Stress : Not on file  Social Connections:   . Frequency of Communication with Friends and Family: Not on file  . Frequency of Social Gatherings with Friends and Family: Not on file  . Attends Religious Services: Not on file  . Active Member of Clubs or Organizations: Not on file  . Attends Banker Meetings: Not on file  . Marital Status: Not on file  Intimate Partner Violence:   . Fear of Current or Ex-Partner: Not on file  . Emotionally Abused: Not on file  . Physically Abused: Not on file  . Sexually Abused: Not on file   Family History  Adopted: Yes  Problem Relation Age of Onset  . Heart disease Father 84       "code blue"  . Alcohol abuse Father   . Diabetes Father   . Cancer Maternal Aunt        skin  . Breast cancer Maternal Aunt   . Heart disease Maternal Aunt        CHF  . Cancer Paternal Grandfather        bladder  . Heart disease Maternal Grandmother   . Congestive Heart Failure Maternal Grandmother   . Breast cancer Maternal Aunt   . Cancer Maternal Aunt        skin  . Nevi Mother   . Heart disease Paternal Grandmother   . Diabetes Other   . Heart disease Other     OBJECTIVE:  Vitals:   07/21/20 1757  BP: 128/81  Pulse: 89  Resp: 16  Temp: 98.7 F (37.1 C)  SpO2: 98%       General appearance: alert; appears fatigued, but nontoxic; speaking in full sentences and tolerating own secretions HEENT: NCAT; Ears: EACs clear, TMs pearly gray; Eyes: PERRL.  EOM grossly intact. Sinuses: Mild frontal sinus tenderness ; Nose: nares patent without rhinorrhea, Throat: Oropharyngeal erythema and cobblestoning present, tonsils non erythematous or enlarged, uvula midline  Neck: supple without LAD Lungs: unlabored  respirations, symmetrical air entry; cough: mild; no respiratory distress; CTAB Heart: regular rate and rhythm.  Radial pulses 2+ symmetrical bilaterally Skin: warm and dry Psychological: alert and cooperative; normal mood and affect  LABS:  No results found for this or any previous visit (from the past 24 hour(s)).   ASSESSMENT & PLAN:  1. Viral illness   2. Nasal congestion   3. Encounter for screening for COVID-19   4. Acute otalgia, left     Meds ordered this encounter  Medications  . fluticasone (FLONASE) 50 MCG/ACT nasal spray    Sig: Place 2 sprays into both nostrils daily.    Dispense:  9.9 mL    Refill:  0    Order Specific Question:   Supervising Provider    Answer:   Merrilee Jansky [id number]  . cetirizine-pseudoephedrine (ZYRTEC-D) 5-120 MG tablet    Sig: Take 1 tablet by mouth daily.    Dispense:  30 tablet    Refill:  0    Order Specific Question:   Supervising Provider    Answer:   Merrilee Jansky [id number]   Prescribe Zyrtec-D Prescribed Flonase If Covid testing is negative, will consider antibiotic therapy given the duration of symptoms and clinical presentation   COVID testing ordered.  It will take between 1-2 days for test results.  Someone will contact you regarding abnormal results.    Patient should remain in quarantine until they have received Covid results.  If negative you may resume normal activities (go back to work/school) while practicing hand hygiene, social distance, and mask wearing.  If positive, patient  should remain in quarantine for 10 days from symptom onset AND greater than 72 hours after symptoms resolution (absence of fever without the use of fever-reducing medication and improvement in respiratory symptoms), whichever is longer Get plenty of rest and push fluids Use OTC zyrtec for nasal congestion, runny nose, and/or sore throat Use OTC flonase for nasal congestion and runny nose Use medications daily for symptom relief Use OTC medications like ibuprofen or tylenol as needed fever or pain Call or go to the ED if you have any new or worsening symptoms such as fever, worsening cough, shortness of breath, chest tightness, chest pain, turning blue, changes in mental status.  Reviewed expectations re: course of current medical issues. Questions answered. Outlined signs and symptoms indicating need for more acute intervention. Patient verbalized understanding. After Visit Summary given.         Moshe Cipro, NP 07/22/20 [phone]

## 2020-07-23 ENCOUNTER — Telehealth (HOSPITAL_COMMUNITY): Payer: Self-pay | Admitting: Emergency Medicine

## 2020-07-23 LAB — NOVEL CORONAVIRUS, NAA: SARS-CoV-2, NAA: NOT DETECTED

## 2020-07-23 LAB — SARS-COV-2, NAA 2 DAY TAT

## 2020-07-23 MED ORDER — AZITHROMYCIN 250 MG PO TABS
250.0000 mg | ORAL_TABLET | Freq: Every day | ORAL | 0 refills | Status: DC
Start: 2020-07-23 — End: 2021-11-30

## 2020-07-23 NOTE — Telephone Encounter (Signed)
Per provider, z-pack sent to pharmacy for patient due to continued sinusitis-type symptoms with negative COVID.

## 2020-09-07 ENCOUNTER — Telehealth (INDEPENDENT_AMBULATORY_CARE_PROVIDER_SITE_OTHER): Payer: 59 | Admitting: Psychiatry

## 2020-09-07 ENCOUNTER — Other Ambulatory Visit: Payer: Self-pay

## 2020-09-07 ENCOUNTER — Encounter: Payer: Self-pay | Admitting: Psychiatry

## 2020-09-07 DIAGNOSIS — F900 Attention-deficit hyperactivity disorder, predominantly inattentive type: Secondary | ICD-10-CM

## 2020-09-07 MED ORDER — AMPHETAMINE-DEXTROAMPHET ER 30 MG PO CP24: 30.0000 mg | ORAL_CAPSULE | ORAL | 0 refills | Status: DC

## 2020-09-07 NOTE — Patient Instructions (Signed)
1. Continue Adderall XR30mg  daily  2. Next appointment:2/7 at 1:20

## 2020-09-07 NOTE — Progress Notes (Signed)
Virtual Visit via Video Note  I connected with Alexis White on 09/07/20 at  2:00 PM EST by a video enabled telemedicine application and verified that I am speaking with the correct person using two identifiers.  Location: Patient: home Provider: office   I discussed the limitations of evaluation and management by telemedicine and the availability of in person appointments. The patient expressed understanding and agreed to proceed.     I discussed the assessment and treatment plan with the patient. The patient was provided an opportunity to ask questions and all were answered. The patient agreed with the plan and demonstrated an understanding of the instructions.   The patient was advised to call back or seek an in-person evaluation if the symptoms worsen or if the condition fails to improve as anticipated.  I provided 12 minutes of non-face-to-face time during this encounter.   Neysa Hottereina Mialynn Shelvin, MD    [facility] OP Progress Note  09/07/2020 1:40 PM Alexis OrganDevin L Delauder  MRN:  [id number]  Chief Complaint:  Chief Complaint    ADHD; Follow-up     HPI:  This is a follow-up appointment for ADHD.  She states that she has been doing well.  She just finished the project of making a kitchen from scratch.  She believes that she has been hyper focused.  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  She reports great relationship with her children, who recently started/changed to public school.  She believes everything is good since her mother moved out.  She denies insomnia.  She denies feeling depressed.  She has been able to complete tasks.  She is forgetful and loses track of time.  She makes alarms on the phone.  She misplaces things.  She believes she has been able to handle things well despite these issues.  She has good appetite.  She gained 15 pounds, which she attributes to not being able to use kitchen.  She is planning to do regular exercise.   Adopted at age 89, [place] up  in [place], moved to [place] in [place] for her husband's job. 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 Attention deficit hyperactivity disorder (ADHD), predominantly inattentive type  F90.0     Past Psychiatric History: Please see initial evaluation for full details. I have reviewed the history. No updates at this time.    Past Medical History:  Past Medical History:  Diagnosis Date  . Absence seizure (HCC)   . Acute kidney failure following labor and delivery   . Acute liver failure   . Allergy   . Anemia   . Asthma   . Complication of anesthesia   . Depression   . HELLP syndrome in first trimester 2009  . HELLP syndrome in first trimester   . Kidney infection   . PONV (postoperative nausea and vomiting)   . Seizures (HCC)     Past Surgical History:  Procedure Laterality Date  . ABDOMINAL HYSTERECTOMY N/A 08/31/2016   Procedure: ABDOMINAL HYSTERECTOMY WITH REMOVAL OF CERVIX;  Surgeon: [name], MD;  Location: [facility];  Service: Gynecology;  Laterality: N/A;  . BREAST ENHANCEMENT SURGERY    . CESAREAN SECTION    . SCAR REVISION  08/31/2016   Procedure: REVISION OF  ABDOMINAL WALL SCAR;  Surgeon: Lazaro Arms, MD;  Location: [facility];  Service: Gynecology;;  . TUBAL LIGATION      Family Psychiatric History: Please see initial evaluation for full details. I have reviewed the history. No updates at this time.     Family History:  Family History  Adopted: Yes  Problem Relation Age of Onset  . Heart disease Father 45       "code blue"  . Alcohol abuse Father   . Diabetes Father   . Cancer Maternal Aunt        skin  . Breast cancer Maternal Aunt   . Heart disease Maternal Aunt        CHF  . Cancer Paternal Grandfather        bladder  . Heart  disease Maternal Grandmother   . Congestive Heart Failure Maternal Grandmother   . Breast cancer Maternal Aunt   . Cancer Maternal Aunt        skin  . Nevi Mother   . Heart disease Paternal Grandmother   . Diabetes Other   . Heart disease Other     Social History:  Social History   Socioeconomic History  . Marital status: Married    Spouse name: Shaun  . Number of children: 3  . Years of education: 50  . Highest education level: Not on file  Occupational History  . Occupation: massage therapy    Comment: by schooling  . Occupation: Bake art- cakes    Comment: self  Tobacco Use  . Smoking status: Former Smoker    Types: E-cigarettes    Quit date: 11/01/2011    Years since quitting: 8.8  . Smokeless tobacco: Never Used  . Tobacco comment: 02-06-2017 PER PT [facility].  Vaping Use  . Vaping Use: Every day  Substance and Sexual Activity  . Alcohol use: No  . Drug use: No  . Sexual activity: Yes    Birth control/protection: Surgical    Comment: hysterectomy  Other Topics Concern  . Not on file  Social History Narrative   Lives at home   Husband Shaun   Three daughters   Tillie Rung to exercise   Social Determinants of Health   Financial Resource Strain:   . Difficulty of Paying Living Expenses: Not on file  Food Insecurity:   . Worried About Programme researcher, broadcasting/film/video in the Last Year: Not on file  . Ran Out of Food in the Last Year: Not on file  Transportation Needs:   . Lack of Transportation (Medical): Not on file  . Lack of Transportation (Non-Medical): Not on file  Physical Activity:   . Days of Exercise per Week: Not on file  . Minutes of Exercise per Session: Not on file  Stress:   . Feeling of Stress : Not on file  Social Connections:   . Frequency of Communication with Friends and Family: Not on file  . Frequency of Social Gatherings with Friends and Family: Not on file  . Attends Religious Services: Not on file  . Active Member of Clubs or Organizations: Not on file   . Attends Banker Meetings: Not on file  . Marital Status: Not on file    Allergies:  Allergies  Allergen Reactions  . Ciprofloxacin Hcl Other (See Comments)    Silent seizures.  . Coppertone Kids Spf15 [Albolene] Hives and Swelling  . Vicodin [Hydrocodone-Acetaminophen] Other (See Comments)    Makes her feel drunk and she prefers not  to take this.    Metabolic Disorder Labs: No results found for: HGBA1C, MPG No results found for: PROLACTIN Lab Results  Component Value Date   CHOL 148 12/26/2016   TRIG 30 12/26/2016   HDL 50 (L) 12/26/2016   CHOLHDL 3.0 12/26/2016   VLDL 6 12/26/2016   LDLCALC 92 12/26/2016   No results found for: TSH  Therapeutic Level Labs: No results found for: LITHIUM No results found for: VALPROATE No components found for:  CBMZ  Current Medications: Current Outpatient Medications  Medication Sig Dispense Refill  . amphetamine-dextroamphetamine (ADDERALL XR) 30 MG 24 hr capsule Take 1 capsule (30 mg total) by mouth daily. 30 capsule 0  . amphetamine-dextroamphetamine (ADDERALL XR) 30 MG 24 hr capsule Take 1 capsule (30 mg total) by mouth daily. 30 capsule 0  . amphetamine-dextroamphetamine (ADDERALL XR) 30 MG 24 hr capsule Take 1 capsule (30 mg total) by mouth every morning. 30 capsule 0  . [START ON 10/07/2020] amphetamine-dextroamphetamine (ADDERALL XR) 30 MG 24 hr capsule Take 1 capsule (30 mg total) by mouth every morning. 30 capsule 0  . [START ON 11/06/2020] amphetamine-dextroamphetamine (ADDERALL XR) 30 MG 24 hr capsule Take 1 capsule (30 mg total) by mouth every morning. 30 capsule 0  . azithromycin (ZITHROMAX) 250 MG tablet Take 1 tablet (250 mg total) by mouth daily. Take first 2 tablets together, then 1 every day until finished. 6 tablet 0  . cephALEXin (KEFLEX) 500 MG capsule Take 1 capsule (500 mg total) by mouth 3 (three) times daily. (Patient not taking: Reported on 04/09/2019) 21 capsule 0  . cetirizine-pseudoephedrine  (ZYRTEC-D) 5-120 MG tablet Take 1 tablet by mouth daily. 30 tablet 0  . fluticasone (FLONASE) 50 MCG/ACT nasal spray Place 2 sprays into both nostrils daily. 9.9 mL 0  . ibuprofen (ADVIL) 200 MG tablet Take 600 mg by mouth daily as needed for mild pain.     . minocycline (MINOCIN) 100 MG capsule Take 100 mg by mouth 2 (two) times daily.     No current facility-administered medications for this visit.     Musculoskeletal: Strength & Muscle Tone: N/A Gait & Station: N/A Patient leans: N/A  Psychiatric Specialty Exam: Review of Systems  Psychiatric/Behavioral: Negative for agitation, behavioral problems, confusion, decreased concentration, dysphoric mood, hallucinations, self-injury, sleep disturbance and suicidal ideas. The patient is not nervous/anxious and is not hyperactive.   All other systems reviewed and are negative.   Last menstrual period 08/31/2016.There is no height or weight on file to calculate BMI.  General Appearance: Fairly Groomed  Eye Contact:  Good  Speech:  Clear and Coherent  Volume:  Normal  Mood:  good  Affect:  Appropriate, Congruent and euthymic  Thought Process:  Coherent  Orientation:  Full (Time, Place, and Person)  Thought Content: Logical   Suicidal Thoughts:  No  Homicidal Thoughts:  No  Memory:  Immediate;   Good  Judgement:  Good  Insight:  Good  Psychomotor Activity:  Normal  Concentration:  Concentration: Good and Attention Span: Good  Recall:  Good  Fund of Knowledge: Good  Language: Good  Akathisia:  No  Handed:  Right  AIMS (if indicated): not done  Assets:  Communication Skills Desire for Improvement  ADL's:  Intact  Cognition: WNL  Sleep:  Fair   Screenings:   Assessment and Plan:  Alexis White is a 34 y.o. year old female with a history of f ADHD, who presents for follow up appointment for below.  1. Attention deficit hyperactivity disorder (ADHD), predominantly inattentive type (diagnosed with neuropsych test  05/2017) She reports occasional forgetfulness and misplacing things, she has had good benefit from Adderall.  Will continue current dose to target ADHD.   Plan I have reviewed and updated plans as below 1. Continue Adderall XR30mg  daily  2. Next appointment:2/7 at 1:20  for 20 mins, video  The patient demonstrates the following risk factors for suicide: Chronic risk factors for suicide include: N/A. Acute risk factorsfor suicide include: N/A. Protective factorsfor this patient include: positive social support, responsibility to others (children, family), coping skills and hope for the future. Considering these factors, the overall suicide risk at this point appears to be low. Patient isappropriate for outpatient follow up.  [name], MD 09/07/2020, 1:40 PM

## 2020-11-05 ENCOUNTER — Telehealth: Payer: Self-pay

## 2020-11-05 ENCOUNTER — Other Ambulatory Visit: Payer: Self-pay | Admitting: Psychiatry

## 2020-11-05 NOTE — Telephone Encounter (Signed)
Please contact the pharmacy to fill Adderall today

## 2020-11-05 NOTE — Telephone Encounter (Signed)
medication management - Telephone call with Carollee Herter, pharmacist and manager at the [facility] on [address] [place]. Informed Dr. Vanetta Shawl was okay with pt getting Aderall XR medication filled on 11/07/20 due to plans going out of town. Pharmacist stated she would want to speak to the patient about the scenario of why she needs early herself as she stated pt last filled on 10/11/20.  Agreed to have pt call collateral back to discuss and pharmacist stated if she agreed pt would need the medication a few days early and approved by Dr. Vanetta Shawl then they would fill it for pt on 11/07/20. Called patient to inform of discussion with Carollee Herter, Production designer, theatre/television/film and pharmacist at her [facility] on [address]  Patient to follow up with collateral to see if they will do final approval of filling the Adderall XR medication on 11/07/20 and informed we had already spoken about Dr. Bing Matter approval to them once they spoke to you.  Pt to call back if any problems.

## 2020-11-05 NOTE — Telephone Encounter (Signed)
Medication problem - Telephone call with pt. regarding a problem she has filling her prescribed Adderall XR. States last filled 10/07/20 and she picked it up on 10/10/20. Pt. is going out of town on 11/07/20 for a funeral, gone 1 week and too early to fill.  Pt. Requests provider call her pharmacy to allow her to pick up the prescription by 11/07/20 so she will not run out while out of state.  Agreed to send request to Dr. Vanetta Shawl.

## 2020-12-01 NOTE — Progress Notes (Signed)
Virtual Visit via Video Note  I connected with Alexis White on 12/07/20 at  1:20 PM EST by a video enabled telemedicine application and verified that I am speaking with the correct person using two identifiers.  Location: Patient: home Provider: office Persons participated in the visit- patient, provider   I discussed the limitations of evaluation and management by telemedicine and the availability of in person appointments. The patient expressed understanding and agreed to proceed.    I discussed the assessment and treatment plan with the patient. The patient was provided an opportunity to ask questions and all were answered. The patient agreed with the plan and demonstrated an understanding of the instructions.   The patient was advised to call back or seek an in-person evaluation if the symptoms worsen or if the condition fails to improve as anticipated.  I provided 12 minutes of non-face-to-face time during this encounter.   [name], MD    [facility] MD/PA/NP OP Progress Note  12/07/2020 1:46 PM Alexis White  MRN:  [id number]  Chief Complaint:  Chief Complaint    Follow-up; ADHD     HPI:  This is a follow-up appointment for ADHD.  She states that she has been doing well.  She had good time with her sister and her brother.  She has been able to set good boundaries with her mother.  She lost her father during holiday season.  She felt it hurt as she will not be able to ask certain questions about her childhood.  She reconnected with her biological father since 10 year old. However,  She thinks she has been doing fine with it.  She reports good relationship with her children.  She continues to be forgetful and misplacing things.  She set reminder as she has "time blindness" especially later in the day.  She denies insomnia.  She denies feeling depressed.  She denies anxiety.  Although she has decreased appetite, she denies any change since being on the medication.  She feels  comfortable to stay on the medication as it is.   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, moved to [place] in 2012 for her husband's job. Reports good childhood   Wt Readings from Last 3 Encounters:  11/04/19 155 lb (70.3 kg)  04/09/19 155 lb (70.3 kg)  01/09/19 151 lb 12.8 oz (68.9 kg)    Visit Diagnosis:    ICD-10-CM   1. Attention deficit hyperactivity disorder (ADHD), predominantly inattentive type  F90.0     Past Psychiatric History: Please see initial evaluation for full details. I have reviewed the history. No updates at this time.     Past Medical History:  Past Medical History:  Diagnosis Date  . Absence seizure (HCC)   . Acute kidney failure following labor and delivery   . Acute liver failure   . Allergy   . Anemia   . Asthma   . Complication of anesthesia   . Depression   . HELLP syndrome in first trimester 2009  . HELLP syndrome in first trimester   . Kidney infection   . PONV (postoperative nausea and vomiting)   . Seizures (HCC)     Past Surgical History:  Procedure Laterality Date  . ABDOMINAL HYSTERECTOMY N/A 08/31/2016   Procedure: ABDOMINAL HYSTERECTOMY WITH REMOVAL OF  CERVIX;  Surgeon: Lazaro Arms, MD;  Location: [facility];  Service: Gynecology;  Laterality: N/A;  . BREAST ENHANCEMENT SURGERY    . CESAREAN SECTION    . SCAR REVISION  08/31/2016   Procedure: REVISION OF ABDOMINAL WALL SCAR;  Surgeon: Lazaro Arms, MD;  Location: [facility];  Service: Gynecology;;  . TUBAL LIGATION      Family Psychiatric History: Please see initial evaluation for full details. I have reviewed the history. No updates at this time.     Family History:  Family History  Adopted: Yes  Problem Relation Age of Onset  . Heart disease Father 45       "code blue"  .  Alcohol abuse Father   . Diabetes Father   . Cancer Maternal Aunt        skin  . Breast cancer Maternal Aunt   . Heart disease Maternal Aunt        CHF  . Cancer Paternal Grandfather        bladder  . Heart disease Maternal Grandmother   . Congestive Heart Failure Maternal Grandmother   . Breast cancer Maternal Aunt   . Cancer Maternal Aunt        skin  . Nevi Mother   . Heart disease Paternal Grandmother   . Diabetes Other   . Heart disease Other     Social History:  Social History   Socioeconomic History  . Marital status: Married    Spouse name: Shaun  . Number of children: 3  . Years of education: 53  . Highest education level: Not on file  Occupational History  . Occupation: massage therapy    Comment: by schooling  . Occupation: Bake art- cakes    Comment: self  Tobacco Use  . Smoking status: Former Smoker    Types: E-cigarettes    Quit date: 11/01/2011    Years since quitting: 9.1  . Smokeless tobacco: Never Used  . Tobacco comment: 02-06-2017 PER PT [facility].  Vaping Use  . Vaping Use: Every day  Substance and Sexual Activity  . Alcohol use: No  . Drug use: No  . Sexual activity: Yes    Birth control/protection: Surgical    Comment: hysterectomy  Other Topics Concern  . Not on file  Social History Narrative   Lives at home   Husband Shaun   Three daughters   Tries to exercise   Social Determinants of Health   Financial Resource Strain: Not on file  Food Insecurity: Not on file  Transportation Needs: Not on file  Physical Activity: Not on file  Stress: Not on file  Social Connections: Not on file    Allergies:  Allergies  Allergen Reactions  . Ciprofloxacin Hcl Other (See Comments)    Silent seizures.  . Coppertone Kids Spf15 [Albolene] Hives and Swelling  . Vicodin [Hydrocodone-Acetaminophen] Other (See Comments)    Makes her feel drunk and she prefers not to take this.    Metabolic Disorder Labs: No results found for: HGBA1C, MPG No  results found for: PROLACTIN Lab Results  Component Value Date   CHOL 148 12/26/2016   TRIG 30 12/26/2016   HDL 50 (L) 12/26/2016   CHOLHDL 3.0 12/26/2016   VLDL 6 12/26/2016   LDLCALC 92 12/26/2016   No results found for: TSH  Therapeutic Level Labs: No results found for: LITHIUM No results found for: VALPROATE No components found for:  CBMZ  Current Medications: Current Outpatient Medications  Medication Sig  Dispense Refill  . [START ON 12/10/2020] amphetamine-dextroamphetamine (ADDERALL XR) 30 MG 24 hr capsule Take 1 capsule (30 mg total) by mouth daily. 30 capsule 0  . [START ON 01/07/2021] amphetamine-dextroamphetamine (ADDERALL XR) 30 MG 24 hr capsule Take 1 capsule (30 mg total) by mouth daily. 30 capsule 0  . [START ON 02/07/2021] amphetamine-dextroamphetamine (ADDERALL XR) 30 MG 24 hr capsule Take 1 capsule (30 mg total) by mouth daily. 30 capsule 0  . azithromycin (ZITHROMAX) 250 MG tablet Take 1 tablet (250 mg total) by mouth daily. Take first 2 tablets together, then 1 every day until finished. 6 tablet 0  . cephALEXin (KEFLEX) 500 MG capsule Take 1 capsule (500 mg total) by mouth 3 (three) times daily. (Patient not taking: Reported on 04/09/2019) 21 capsule 0  . cetirizine-pseudoephedrine (ZYRTEC-D) 5-120 MG tablet Take 1 tablet by mouth daily. 30 tablet 0  . fluticasone (FLONASE) 50 MCG/ACT nasal spray Place 2 sprays into both nostrils daily. 9.9 mL 0  . ibuprofen (ADVIL) 200 MG tablet Take 600 mg by mouth daily as needed for mild pain.     . minocycline (MINOCIN) 100 MG capsule Take 100 mg by mouth 2 (two) times daily.     No current facility-administered medications for this visit.     Musculoskeletal: Strength & Muscle Tone: N/A Gait & Station: N/A Patient leans: N/A  Psychiatric Specialty Exam: Review of Systems  Psychiatric/Behavioral: Positive for decreased concentration. Negative for agitation, behavioral problems, confusion, dysphoric mood, hallucinations,  self-injury, sleep disturbance and suicidal ideas. The patient is not nervous/anxious and is not hyperactive.   All other systems reviewed and are negative.   Last menstrual period 08/31/2016.There is no height or weight on file to calculate BMI.  General Appearance: Fairly Groomed  Eye Contact:  Good  Speech:  Clear and Coherent  Volume:  Normal  Mood:  good  Affect:  Appropriate, Congruent and euthymic  Thought Process:  Coherent  Orientation:  Full (Time, Place, and Person)  Thought Content: Logical   Suicidal Thoughts:  No  Homicidal Thoughts:  No  Memory:  Immediate;   Good  Judgement:  Good  Insight:  Good  Psychomotor Activity:  Normal  Concentration:  Concentration: Good and Attention Span: Good  Recall:  Good  Fund of Knowledge: Good  Language: Good  Akathisia:  No  Handed:  Right  AIMS (if indicated): not done  Assets:  Communication Skills Desire for Improvement  ADL's:  Intact  Cognition: WNL  Sleep:  Good   Screenings:   Assessment and Plan:  Alexis White is a 35 y.o. year old female with a history of ADHD, who presents for follow up appointment for below.   1. Attention deficit hyperactivity disorder (ADHD), predominantly inattentive type Although she reports forgetfulness, she has been able to manage things well.  She has had good benefit from Adderall; will continue the current dose to target ADHD.   Plan I have reviewed and updated plans as below 1. Continue Adderall XR30mg  daily  2. Next appointment:5/6 at 9:20  for 20 mins, video  The patient demonstrates the following risk factors for suicide: Chronic risk factors for suicide include: N/A. Acute risk factorsfor suicide include: N/A. Protective factorsfor this patient include: positive social support, responsibility to others (children, family), coping skills and hope for the future. Considering these factors, the overall suicide risk at this point appears to be low. Patient isappropriate  for outpatient follow up.   [name], MD 12/07/2020, 1:46 PM

## 2020-12-07 ENCOUNTER — Telehealth (INDEPENDENT_AMBULATORY_CARE_PROVIDER_SITE_OTHER): Payer: 59 | Admitting: Psychiatry

## 2020-12-07 ENCOUNTER — Encounter: Payer: Self-pay | Admitting: Psychiatry

## 2020-12-07 ENCOUNTER — Other Ambulatory Visit: Payer: Self-pay

## 2020-12-07 DIAGNOSIS — F900 Attention-deficit hyperactivity disorder, predominantly inattentive type: Secondary | ICD-10-CM

## 2020-12-07 MED ORDER — AMPHETAMINE-DEXTROAMPHET ER 30 MG PO CP24: 30.0000 mg | ORAL_CAPSULE | Freq: Every day | ORAL | 0 refills | Status: DC

## 2021-02-25 NOTE — Progress Notes (Signed)
Virtual Visit via Video White  I connected with Alexis White on 03/05/21 at  9:20 AM EDT by a video enabled telemedicine application and verified that I am speaking with the correct person using two identifiers.  Location: Patient: home Provider: office Persons participated in the visit- patient, provider   I discussed the limitations of evaluation and management by telemedicine and the availability of in person appointments. The patient expressed understanding and agreed to proceed.    I discussed the assessment and treatment plan with the patient. The patient was provided an opportunity to ask questions and all were answered. The patient agreed with the plan and demonstrated an understanding of the instructions.   The patient was advised to call back or seek an in-person evaluation if the symptoms worsen or if the condition fails to improve as anticipated.  I provided 16 minutes of non-face-to-face time during this encounter.   Alexis Hottereina Leotha Voeltz, MD    Alexis White  03/05/2021 9:36 AM Alexis White  MRN:  [id number]  Chief Complaint:  Chief Complaint    Follow-up; ADHD     HPI:  This is a follow-up appointment for ADHD.  She states that she has been doing well.  [facility]  She is leading the group of people from all over the country.  She loves the group.  Although her middle daughter is struggling with transitioning from private to public school as she tends to be hyper focused on things, Alexis White has been able to give her a good support.  She denies any mood symptoms except fatigue.  She takes clarithin for allergy, which makes her feel drowsy.  She has been able to focus on things except that there was 1 time she forgot whether or not she took medication.  She has good appetite.  She is hoping to go back on regular exercise once her allergy is improved.  She denies feeling depressed.  She denies SI.    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 Reports good childhood  160 lbs Wt Readings from Last 3 Encounters:  11/04/19 155 lb (70.3 kg)  04/09/19 155 lb (70.3 kg)  01/09/19 151 lb 12.8 oz (68.9 kg)    Visit Diagnosis:    ICD-10-CM   1. Attention deficit hyperactivity disorder (ADHD), predominantly inattentive type  F90.0     Past Psychiatric History: Please see initial evaluation for full details. I have reviewed the history. No updates at this time.     Past Medical History:  Past Medical History:  Diagnosis Date  . Absence seizure (HCC)   . Acute kidney failure following labor and delivery   . Acute liver failure   . Allergy   . Anemia   . Asthma   . Complication of anesthesia   . Depression   . HELLP syndrome in first trimester 2009  . HELLP syndrome in first trimester   . Kidney infection   . PONV (postoperative nausea and vomiting)   . Seizures (HCC)     Past Surgical History:  Procedure Laterality Date  . ABDOMINAL HYSTERECTOMY N/A 08/31/2016   Procedure: ABDOMINAL HYSTERECTOMY WITH REMOVAL OF CERVIX;  Surgeon: Alexis ArmsLuther H Eure, MD;  Location:  [facility];  Service: Gynecology;  Laterality: N/A;  . BREAST ENHANCEMENT SURGERY    . CESAREAN SECTION    . SCAR REVISION  08/31/2016   Procedure: REVISION OF ABDOMINAL WALL SCAR;  Surgeon: Alexis Arms, MD;  Location: [facility];  Service: Gynecology;;  . TUBAL LIGATION      Family Psychiatric History: Please see initial evaluation for full details. I have reviewed the history. No updates at this time.     Family History:  Family History  Adopted: Yes  Problem Relation Age of Onset  . Heart disease Father 63       "code blue"  . Alcohol abuse Father   . Diabetes Father   . Cancer Maternal Aunt        skin   . Breast cancer Maternal Aunt   . Heart disease Maternal Aunt        CHF  . Cancer Paternal Grandfather        bladder  . Heart disease Maternal Grandmother   . Congestive Heart Failure Maternal Grandmother   . Breast cancer Maternal Aunt   . Cancer Maternal Aunt        skin  . Nevi Mother   . Heart disease Paternal Grandmother   . Diabetes Other   . Heart disease Other     Social History:  Social History   Socioeconomic History  . Marital status: Married    Spouse name: Alexis White  . Number of children: 3  . Years of education: 23  . Highest education level: Not on file  Occupational History  . Occupation: massage therapy    Comment: by schooling  . Occupation: Bake art- cakes    Comment: self  Tobacco Use  . Smoking status: Former Smoker    Types: E-cigarettes    Quit date: 11/01/2011    Years since quitting: 9.3  . Smokeless tobacco: Never Used  . Tobacco comment: 02-06-2017 PER PT [facility].  Vaping Use  . Vaping Use: Every day  Substance and Sexual Activity  . Alcohol use: No  . Drug use: No  . Sexual activity: Yes    Birth control/protection: Surgical    Comment: hysterectomy  Other Topics Concern  . Not on file  Social History Narrative   Lives at home   Husband Alexis White   Three daughters   Tries to exercise   Social Determinants of Health   Financial Resource Strain: Not on file  Food Insecurity: Not on file  Transportation Needs: Not on file  Physical Activity: Not on file  Stress: Not on file  Social Connections: Not on file    Allergies:  Allergies  Allergen Reactions  . Ciprofloxacin Hcl Other (See Comments)    Silent seizures.  . Coppertone Kids Spf15 [Albolene] Hives and Swelling  . Vicodin [Hydrocodone-Acetaminophen] Other (See Comments)    Makes her feel drunk and she prefers not to take this.    Metabolic Disorder Labs: No results found for: HGBA1C, MPG No results found for: PROLACTIN Lab Results  Component Value Date   CHOL 148  12/26/2016   TRIG 30 12/26/2016   HDL 50 (L) 12/26/2016   CHOLHDL 3.0 12/26/2016   VLDL 6 12/26/2016   LDLCALC 92 12/26/2016   No results found for: TSH  Therapeutic Level Labs: No results found for: LITHIUM No results found for: VALPROATE No components found for:  CBMZ  Current Medications: Current Outpatient Medications  Medication Sig Dispense Refill  . amphetamine-dextroamphetamine (ADDERALL XR) 30 MG  24 hr capsule Take 1 capsule (30 mg total) by mouth every morning. 30 capsule 0  . [START ON 04/04/2021] amphetamine-dextroamphetamine (ADDERALL XR) 30 MG 24 hr capsule Take 1 capsule (30 mg total) by mouth every morning. 30 capsule 0  . [START ON 05/04/2021] amphetamine-dextroamphetamine (ADDERALL XR) 30 MG 24 hr capsule Take 1 capsule (30 mg total) by mouth every morning. 30 capsule 0  . azithromycin (ZITHROMAX) 250 MG tablet Take 1 tablet (250 mg total) by mouth daily. Take first 2 tablets together, then 1 every day until finished. 6 tablet 0  . cephALEXin (KEFLEX) 500 MG capsule Take 1 capsule (500 mg total) by mouth 3 (three) times daily. (Patient not taking: Reported on 04/09/2019) 21 capsule 0  . cetirizine-pseudoephedrine (ZYRTEC-D) 5-120 MG tablet Take 1 tablet by mouth daily. 30 tablet 0  . fluticasone (FLONASE) 50 MCG/ACT nasal spray Place 2 sprays into both nostrils daily. 9.9 mL 0  . ibuprofen (ADVIL) 200 MG tablet Take 600 mg by mouth daily as needed for mild pain.     . minocycline (MINOCIN) 100 MG capsule Take 100 mg by mouth 2 (two) times daily.     No current facility-administered medications for this visit.     Musculoskeletal: Strength & Muscle Tone: N/A Gait & Station: N/A Patient leans: N/A  Psychiatric Specialty Exam: Review of Systems  Psychiatric/Behavioral: Positive for decreased concentration. Negative for agitation, behavioral problems, confusion, dysphoric mood, hallucinations, self-injury, sleep disturbance and suicidal ideas. The patient is not  nervous/anxious and is not hyperactive.   All other systems reviewed and are negative.   Last menstrual period 08/31/2016.There is no height or weight on file to calculate BMI.  General Appearance: Fairly Groomed  Eye Contact:  Good  Speech:  Clear and Coherent  Volume:  Normal  Mood:  good  Affect:  Blunt, Congruent and euthymic  Thought Process:  Coherent  Orientation:  Full (Time, Place, and Person)  Thought Content: Logical   Suicidal Thoughts:  No  Homicidal Thoughts:  No  Memory:  Immediate;   Good  Judgement:  Good  Insight:  Good  Psychomotor Activity:  Normal  Concentration:  Concentration: Good and Attention Span: Good  Recall:  Good  Fund of Knowledge: Good  Language: Good  Akathisia:  No  Handed:  Right  AIMS (if indicated): not done  Assets:  Communication Skills Desire for Improvement  ADL's:  Intact  Cognition: WNL  Sleep:  Fair   Screenings: PHQ2-9   Flowsheet Row Video Visit [facility]/05/2021 in [facility]  PHQ-2 Total Score 0       Assessment and Plan:  KAELEY VINJE is a 35 y.o. year old female with a history of  ADHD, who presents for follow up appointment for below.   1. Attention deficit hyperactivity disorder (ADHD), predominantly inattentive type She denies significant ADHD symptoms except occasional forgetfulness.  We will continue current dose of Adderall to target ADHD.   This clinician has discussed the side effect associated with medication prescribed during this encounter. Please refer to notes in the previous encounters for more details.   Plan  I have reviewed and updated plans as below 1. Continue Adderall XR30mg  daily  2. Next appointment:8/8 at 1 PMfor 20 mins, video  The patient demonstrates the following risk factors for suicide: Chronic risk factors for suicide include: N/A. Acute risk factorsfor suicide include: N/A. Protective factorsfor this patient include: positive social support,  responsibility to others (children, family), coping skills  and hope for the future. Considering these factors, the overall suicide risk at this point appears to be low. Patient isappropriate for outpatient follow up.  Alexis Hotter, MD 03/05/2021, 9:36 AM

## 2021-03-05 ENCOUNTER — Telehealth (INDEPENDENT_AMBULATORY_CARE_PROVIDER_SITE_OTHER): Payer: 59 | Admitting: Psychiatry

## 2021-03-05 ENCOUNTER — Encounter: Payer: Self-pay | Admitting: Psychiatry

## 2021-03-05 ENCOUNTER — Other Ambulatory Visit: Payer: Self-pay

## 2021-03-05 DIAGNOSIS — F900 Attention-deficit hyperactivity disorder, predominantly inattentive type: Secondary | ICD-10-CM | POA: Diagnosis not present

## 2021-03-05 MED ORDER — AMPHETAMINE-DEXTROAMPHET ER 30 MG PO CP24: 30.0000 mg | ORAL_CAPSULE | ORAL | 0 refills | Status: DC

## 2021-03-05 NOTE — Patient Instructions (Signed)
1. Continue Adderall XR30mg  daily  2. Next appointment:8/8 at 1 PM

## 2021-05-26 NOTE — Progress Notes (Signed)
Virtual Visit via Video Note  I connected with Alexis White on 06/07/21 at  1:00 PM EDT by a video enabled telemedicine application and verified that I am speaking with the correct person using two identifiers.  Location: Patient: car Provider: office Persons participated in the visit- patient, provider    I discussed the limitations of evaluation and management by telemedicine and the availability of in person appointments. The patient expressed understanding and agreed to proceed.    I discussed the assessment and treatment plan with the patient. The patient was provided an opportunity to ask questions and all were answered. The patient agreed with the plan and demonstrated an understanding of the instructions.   The patient was advised to call back or seek an in-person evaluation if the symptoms worsen or if the condition fails to improve as anticipated.  I provided 10 minutes of non-face-to-face time during this encounter.   [name], MD     [facility] MD/PA/NP OP Progress Note  06/07/2021 1:19 PM Alexis White  MRN:  [id number]  Chief Complaint:  Chief Complaint   ADHD; Follow-up    HPI:  This is a follow-up appointment for ADHD.  She states that she is on her way back home; her husband is driving in a vehicle.  She visited [place] as there was a loss of her sister's father-in-law.  Although she was having insomnia due to this recent visit, she thinks it has been fine otherwise.  She has been trying to prepare for her girls going back to school.  She tries to have routine as she does better that way.  She has been able to focus on things well, and complete tasks.  Although she feels anxious as she has no more information for the upcoming classes, she has been handling things well otherwise.  She lost 5 pounds over the last month.  She believes there has been "hormonal imbalance "; she is planning to see OB/GYN and endocrine neurologist.  She states that she has some hair on  her chin.  She denies feeling depressed or anhedonia.  She denies alcohol use or drug use.  She feels comfortable to stay on her medication.   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 Reports good childhood  Visit Diagnosis:    ICD-10-CM   1. Attention deficit hyperactivity disorder (ADHD), predominantly inattentive type  F90.0       Past Psychiatric History: ,Please see initial evaluation for full details. I have reviewed the history. No updates at this time.     Past Medical History:  Past Medical History:  Diagnosis Date   Absence seizure (HCC)    Acute kidney failure following labor and delivery    Acute liver failure    Allergy    Anemia    Asthma    Complication of anesthesia    Depression    HELLP syndrome in first trimester 2009   HELLP syndrome in first trimester    Kidney infection    PONV (postoperative nausea and vomiting)    Seizures (HCC)     Past Surgical History:  Procedure Laterality Date   ABDOMINAL HYSTERECTOMY N/A 08/31/2016   Procedure: ABDOMINAL HYSTERECTOMY WITH REMOVAL OF CERVIX;  Surgeon: Lazaro Arms, MD;  Location: [facility];  Service: Gynecology;  Laterality: N/A;   BREAST ENHANCEMENT SURGERY     CESAREAN SECTION     SCAR REVISION  08/31/2016   Procedure: REVISION OF ABDOMINAL WALL SCAR;  Surgeon: Lazaro Arms, MD;  Location: [facility];  Service: Gynecology;;   TUBAL LIGATION      Family Psychiatric History: Please see initial evaluation for full details. I have reviewed the history. No updates at this time.     Family History:  Family History  Adopted: Yes  Problem Relation Age of Onset   Heart disease Father 63       "code blue"   Alcohol abuse Father    Diabetes Father    Cancer Maternal Aunt         skin   Breast cancer Maternal Aunt    Heart disease Maternal Aunt        CHF   Cancer Paternal Grandfather        bladder   Heart disease Maternal Grandmother    Congestive Heart Failure Maternal Grandmother    Breast cancer Maternal Aunt    Cancer Maternal Aunt        skin   Nevi Mother    Heart disease Paternal Grandmother    Diabetes Other    Heart disease Other     Social History:  Social History   Socioeconomic History   Marital status: Married    Spouse name: Alexis White   Number of children: 3   Years of education: 13   Highest education level: Not on file  Occupational History   Occupation: massage therapy    Comment: by schooling   Occupation: Bake art- cakes    Comment: self  Tobacco Use   Smoking status: Former    Types: E-cigarettes    Quit date: 11/01/2011    Years since quitting: 9.6   Smokeless tobacco: Never   Tobacco comments:    02-06-2017 PER PT [facility].  Vaping Use   Vaping Use: Every day  Substance and Sexual Activity   Alcohol use: No   Drug use: No   Sexual activity: Yes    Birth control/protection: Surgical    Comment: hysterectomy  Other Topics Concern   Not on file  Social History Narrative   Lives at home   Husband Alexis White   Three daughters   Tries to exercise   Social Determinants of Health   Financial Resource Strain: Not on file  Food Insecurity: Not on file  Transportation Needs: Not on file  Physical Activity: Not on file  Stress: Not on file  Social Connections: Not on file    Allergies:  Allergies  Allergen Reactions   Ciprofloxacin Hcl Other (See Comments)    Silent seizures.   Coppertone Kids Spf15 [Albolene] Hives and Swelling   Vicodin [Hydrocodone-Acetaminophen] Other (See Comments)    Makes her feel drunk and she prefers not to take this.    Metabolic Disorder Labs: No results found for: HGBA1C, MPG No results found for: PROLACTIN Lab Results  Component Value Date   CHOL 148 12/26/2016   TRIG 30  12/26/2016   HDL 50 (L) 12/26/2016   CHOLHDL 3.0 12/26/2016   VLDL 6 12/26/2016   LDLCALC 92 12/26/2016   No results found for: TSH  Therapeutic Level Labs: No results found for: LITHIUM No results found for: VALPROATE No components found for:  CBMZ  Current Medications: Current Outpatient Medications  Medication Sig Dispense Refill   amphetamine-dextroamphetamine (ADDERALL XR)  30 MG 24 hr capsule Take 1 capsule (30 mg total) by mouth every morning. 30 capsule 0   [START ON 07/07/2021] amphetamine-dextroamphetamine (ADDERALL XR) 30 MG 24 hr capsule Take 1 capsule (30 mg total) by mouth every morning. 30 capsule 0   [START ON 08/06/2021] amphetamine-dextroamphetamine (ADDERALL XR) 30 MG 24 hr capsule Take 1 capsule (30 mg total) by mouth every morning. 30 capsule 0   azithromycin (ZITHROMAX) 250 MG tablet Take 1 tablet (250 mg total) by mouth daily. Take first 2 tablets together, then 1 every day until finished. 6 tablet 0   cephALEXin (KEFLEX) 500 MG capsule Take 1 capsule (500 mg total) by mouth 3 (three) times daily. (Patient not taking: Reported on 04/09/2019) 21 capsule 0   cetirizine-pseudoephedrine (ZYRTEC-D) 5-120 MG tablet Take 1 tablet by mouth daily. 30 tablet 0   fluticasone (FLONASE) 50 MCG/ACT nasal spray Place 2 sprays into both nostrils daily. 9.9 mL 0   ibuprofen (ADVIL) 200 MG tablet Take 600 mg by mouth daily as needed for mild pain.      minocycline (MINOCIN) 100 MG capsule Take 100 mg by mouth 2 (two) times daily.     No current facility-administered medications for this visit.     Musculoskeletal: Strength & Muscle Tone:  N/A Gait & Station:  N/A Patient leans: N/A  Psychiatric Specialty Exam: Review of Systems  Psychiatric/Behavioral:  Positive for sleep disturbance. Negative for agitation, behavioral problems, confusion, decreased concentration, dysphoric mood, hallucinations, self-injury and suicidal ideas. The patient is nervous/anxious. The patient is not  hyperactive.   All other systems reviewed and are negative.  Last menstrual period 08/31/2016.There is no height or weight on file to calculate BMI.  General Appearance: Fairly Groomed  Eye Contact:  Good  Speech:  Clear and Coherent  Volume:  Normal  Mood:   good  Affect:  Appropriate, Congruent, and euthymic  Thought Process:  Coherent  Orientation:  Full (Time, Place, and Person)  Thought Content: Logical   Suicidal Thoughts:  No  Homicidal Thoughts:  No  Memory:  Immediate;   Good  Judgement:  Good  Insight:  Good  Psychomotor Activity:  Normal  Concentration:  Concentration: Good and Attention Span: Good  Recall:  Good  Fund of Knowledge: Good  Language: Good  Akathisia:  No  Handed:  Right  AIMS (if indicated): not done  Assets:  Communication Skills Desire for Improvement  ADL's:  Intact  Cognition: WNL  Sleep:  Fair   Screenings: PHQ2-9    Flowsheet Row Video [facility]  PHQ-2 Total Score 0        Assessment and Plan:  Alexis OrganDevin L Delucia is a 35 y.o. year old female with a history of ADHD, who presents for follow up appointment for below.   1. Attention deficit hyperactivity disorder (ADHD), predominantly inattentive type She denies any significant ADHD symptoms, and has good benefit from Adderall.  We will continue current dose to target ADHD.   This clinician has discussed the side effect associated with medication prescribed during this encounter. Please refer to notes in the previous encounters for more details.    Plan  I have reviewed and updated plans as below 1. Continue Adderall XR 30 mg daily     2. Next appointment: 11/3 at 1:40  for 20 mins, video   The patient demonstrates the following risk factors for suicide: Chronic risk factors for suicide include: N/A. Acute risk factors for suicide  include: N/A. Protective factors for this patient include: positive social support, responsibility to  others (children, family), coping skills and hope for the future. Considering these factors, the overall suicide risk at this point appears to be low. Patient is appropriate for outpatient follow up.          [name], MD 06/07/2021, 1:19 PM

## 2021-06-07 ENCOUNTER — Other Ambulatory Visit: Payer: Self-pay

## 2021-06-07 ENCOUNTER — Telehealth (INDEPENDENT_AMBULATORY_CARE_PROVIDER_SITE_OTHER): Payer: 59 | Admitting: Psychiatry

## 2021-06-07 ENCOUNTER — Encounter: Payer: Self-pay | Admitting: Psychiatry

## 2021-06-07 DIAGNOSIS — F900 Attention-deficit hyperactivity disorder, predominantly inattentive type: Secondary | ICD-10-CM | POA: Diagnosis not present

## 2021-06-07 MED ORDER — AMPHETAMINE-DEXTROAMPHET ER 30 MG PO CP24: 30.0000 mg | ORAL_CAPSULE | ORAL | 0 refills | Status: DC

## 2021-06-07 NOTE — Patient Instructions (Signed)
1. Continue Adderall XR 30 mg daily     2. Next appointment: 11/3 at 1:40

## 2021-06-29 ENCOUNTER — Ambulatory Visit (INDEPENDENT_AMBULATORY_CARE_PROVIDER_SITE_OTHER): Payer: 59 | Admitting: Obstetrics & Gynecology

## 2021-06-29 ENCOUNTER — Other Ambulatory Visit: Payer: Self-pay

## 2021-06-29 ENCOUNTER — Encounter: Payer: Self-pay | Admitting: Obstetrics & Gynecology

## 2021-06-29 VITALS — BP 119/83 | HR 98 | Ht 63.0 in | Wt 163.4 lb

## 2021-06-29 DIAGNOSIS — Z01419 Encounter for gynecological examination (general) (routine) without abnormal findings: Secondary | ICD-10-CM

## 2021-06-29 DIAGNOSIS — R635 Abnormal weight gain: Secondary | ICD-10-CM

## 2021-06-29 DIAGNOSIS — L680 Hirsutism: Secondary | ICD-10-CM

## 2021-06-29 NOTE — Progress Notes (Signed)
Subjective:     Alexis White is a 35 y.o. female here for a routine exam.  Patient's last menstrual period was 08/31/2016 (exact date). [id number] Birth Control Method:  hysterectomy Menstrual Calendar(currently): n/a  Current complaints: facial hair, head hair loss(COVID 1/22).   Current acute medical issues:  arthritis, ADHD   Recent Gynecologic History Patient's last menstrual period was 08/31/2016 (exact date). Last Pap: n/a,   Last mammogram: n/a,    Past Medical History:  Diagnosis Date   Absence seizure (HCC)    Acute kidney failure following labor and delivery    Acute liver failure    Allergy    Anemia    Asthma    Complication of anesthesia    Depression    HELLP syndrome in first trimester 2009   HELLP syndrome in first trimester    Kidney infection    PONV (postoperative nausea and vomiting)    Seizures (HCC)     Past Surgical History:  Procedure Laterality Date   ABDOMINAL HYSTERECTOMY N/A 08/31/2016   Procedure: ABDOMINAL HYSTERECTOMY WITH REMOVAL OF CERVIX;  Surgeon: Lazaro Arms, MD;  Location: [facility];  Service: Gynecology;  Laterality: N/A;   BREAST ENHANCEMENT SURGERY     CESAREAN SECTION     SCAR REVISION  08/31/2016   Procedure: REVISION OF ABDOMINAL WALL SCAR;  Surgeon: Lazaro Arms, MD;  Location: [facility];  Service: Gynecology;;   TUBAL LIGATION      OB History     Gravida  12   Para      Term      Preterm      AB  9   Living  3      SAB  7   IAB      Ectopic      Multiple      Live Births  3           Social History   Socioeconomic History   Marital status: Married    Spouse name: Shaun   Number of children: 3   Years of education: 13   Highest education level: Not on file  Occupational History   Occupation: massage therapy    Comment: by schooling   Occupation: Bake art- cakes    Comment: self  Tobacco Use   Smoking status: Former    Types: E-cigarettes    Quit date: 11/01/2011    Years since quitting: 9.6    Smokeless tobacco: Never   Tobacco comments:    02-06-2017 PER PT [facility].  Vaping Use   Vaping Use: Every day  Substance and Sexual Activity   Alcohol use: No   Drug use: No   Sexual activity: Yes    Birth control/protection: Surgical    Comment: hysterectomy  Other Topics Concern   Not on file  Social History Narrative   Lives at home   Husband Shaun   Three daughters   Tries to exercise   Social Determinants of Health   Financial Resource Strain: Not on file  Food Insecurity: No Food Insecurity   Worried About Programme researcher, broadcasting/film/video in the Last Year: Never true   Ran Out of Food in the Last Year: Never true  Transportation Needs: No Transportation Needs   Lack of Transportation (Medical): No   Lack of Transportation (Non-Medical): No  Physical Activity: Insufficiently Active   Days of Exercise per Week: 2 days   Minutes of Exercise per Session: 40 min  Stress: No Stress  Concern Present   Feeling of Stress : Not at all  Social Connections: Socially Integrated   Frequency of Communication with Friends and Family: More than three times a week   Frequency of Social Gatherings with Friends and Family: Once a week   Attends Religious Services: More than 4 times per year   Active Member of [employer] or Organizations: Yes   Attends Engineer, structural: More than 4 times per year   Marital Status: Married    Family History  Adopted: Yes  Problem Relation Age of Onset   Cancer Paternal Grandfather        bladder   Heart disease Paternal Grandmother    Heart disease Maternal Grandmother    Congestive Heart Failure Maternal Grandmother    Heart disease Father 71       "code blue"   Alcohol abuse Father    Diabetes Father    Nevi Mother    Cancer Maternal Aunt        skin   Breast cancer Maternal Aunt    Heart disease Maternal Aunt        CHF   Breast cancer Maternal Aunt    Cancer Maternal Aunt        skin   Diabetes Other    Heart disease Other       Current Outpatient Medications:    amphetamine-dextroamphetamine (ADDERALL XR) 30 MG 24 hr capsule, Take 1 capsule (30 mg total) by mouth every morning., Disp: 30 capsule, Rfl: 0   diclofenac Sodium (VOLTAREN) 1 % GEL, Apply topically 4 (four) times daily., Disp: , Rfl:    [START ON 07/07/2021] amphetamine-dextroamphetamine (ADDERALL XR) 30 MG 24 hr capsule, Take 1 capsule (30 mg total) by mouth every morning. (Patient not taking: Reported on 06/29/2021), Disp: 30 capsule, Rfl: 0   [START ON 08/06/2021] amphetamine-dextroamphetamine (ADDERALL XR) 30 MG 24 hr capsule, Take 1 capsule (30 mg total) by mouth every morning. (Patient not taking: Reported on 06/29/2021), Disp: 30 capsule, Rfl: 0   azithromycin (ZITHROMAX) 250 MG tablet, Take 1 tablet (250 mg total) by mouth daily. Take first 2 tablets together, then 1 every day until finished. (Patient not taking: Reported on 06/29/2021), Disp: 6 tablet, Rfl: 0   cephALEXin (KEFLEX) 500 MG capsule, Take 1 capsule (500 mg total) by mouth 3 (three) times daily. (Patient not taking: No sig reported), Disp: 21 capsule, Rfl: 0   cetirizine-pseudoephedrine (ZYRTEC-D) 5-120 MG tablet, Take 1 tablet by mouth daily. (Patient not taking: Reported on 06/29/2021), Disp: 30 tablet, Rfl: 0   fluticasone (FLONASE) 50 MCG/ACT nasal spray, Place 2 sprays into both nostrils daily. (Patient not taking: Reported on 06/29/2021), Disp: 9.9 mL, Rfl: 0   ibuprofen (ADVIL) 200 MG tablet, Take 600 mg by mouth daily as needed for mild pain.  (Patient not taking: Reported on 06/29/2021), Disp: , Rfl:    minocycline (MINOCIN) 100 MG capsule, Take 100 mg by mouth 2 (two) times daily. (Patient not taking: Reported on 06/29/2021), Disp: , Rfl:   Review of Systems  Review of Systems  Constitutional: Negative for fever, chills, weight loss, malaise/fatigue and diaphoresis.  HENT: Negative for hearing loss, ear pain, nosebleeds, congestion, sore throat, neck pain, tinnitus and ear discharge.    Eyes: Negative for blurred vision, double vision, photophobia, pain, discharge and redness.  Respiratory: Negative for cough, hemoptysis, sputum production, shortness of breath, wheezing and stridor.   Cardiovascular: Negative for chest pain, palpitations, orthopnea, claudication, leg swelling and PND.  Gastrointestinal: negative for abdominal pain. Negative for heartburn, nausea, vomiting, diarrhea, constipation, blood in stool and melena.  Genitourinary: Negative for dysuria, urgency, frequency, hematuria and flank pain.  Musculoskeletal: Negative for myalgias, back pain, joint pain and falls.  Skin: Negative for itching and rash.  Neurological: Negative for dizziness, tingling, tremors, sensory change, speech change, focal weakness, seizures, loss of consciousness, weakness and headaches.  Endo/Heme/Allergies: Negative for environmental allergies and polydipsia. Does not bruise/bleed easily.  Psychiatric/Behavioral: Negative for depression, suicidal ideas, hallucinations, memory loss and substance abuse. The patient is not nervous/anxious and does not have insomnia.        Objective:  Blood pressure 119/83, pulse 98, height 5\' 3"  (1.6 m), weight 163 lb 6.4 oz (74.1 kg), last menstrual period 08/31/2016.   Physical Exam  Vitals reviewed. Constitutional: She is oriented to person, place, and time. She appears well-developed and well-nourished.  HENT:  Head: Normocephalic and atraumatic.        Right Ear: External ear normal.  Left Ear: External ear normal.  Nose: Nose normal.  Mouth/Throat: Oropharynx is clear and moist.  Eyes: Conjunctivae and EOM are normal. Pupils are equal, round, and reactive to light. Right eye exhibits no discharge. Left eye exhibits no discharge. No scleral icterus.  Neck: Normal range of motion. Neck supple. No tracheal deviation present. No thyromegaly present.  Cardiovascular: Normal rate, regular rhythm, normal heart sounds and intact distal pulses.  Exam  reveals no gallop and no friction rub.   No murmur heard. Respiratory: Effort normal and breath sounds normal. No respiratory distress. She has no wheezes. She has no rales. She exhibits no tenderness.  GI: Soft. Bowel sounds are normal. She exhibits no distension and no mass. There is no tenderness. There is no rebound and no guarding.  Genitourinary:  Breasts no masses skin changes or nipple changes bilaterally      Vulva is normal without lesions Vagina is pink moist without discharge, good estrogenic effect Cervix absent Uterus is absent Adnexa is negative   Musculoskeletal: Normal range of motion. She exhibits no edema and no tenderness.  Neurological: She is alert and oriented to person, place, and time. She has normal reflexes. She displays normal reflexes. No cranial nerve deficit. She exhibits normal muscle tone. Coordination normal.  Skin: Skin is warm and dry. No rash noted. No erythema. No pallor.  Psychiatric: She has a normal mood and affect. Her behavior is normal. Judgment and thought content normal.       Medications Ordered at today's visit: No orders of the defined types were placed in this encounter.   Other orders placed at today's visit: Orders Placed This Encounter  Procedures   FSH   TSH      Assessment:    Normal Gyn exam.   COVID related hair loss Plan:    Check labs   Follow up with patient regarding results  Return in about 3 years (around 06/29/2024) for yearly.

## 2021-07-03 LAB — FOLLICLE STIMULATING HORMONE: FSH: 2.8 m[IU]/mL

## 2021-07-03 LAB — TSH: TSH: 1.32 u[IU]/mL (ref 0.450–4.500)

## 2021-09-01 NOTE — Progress Notes (Signed)
Virtual Visit via Video Note  I connected with Alexis White on 09/02/21 at  1:40 PM EDT by a video enabled telemedicine application and verified that I am speaking with the correct person using two identifiers.  Location: Patient: home Provider: office Persons participated in the visit- patient, provider    I discussed the limitations of evaluation and management by telemedicine and the availability of in person appointments. The patient expressed understanding and agreed to proceed.    I discussed the assessment and treatment plan with the patient. The patient was provided an opportunity to ask questions and all were answered. The patient agreed with the plan and demonstrated an understanding of the instructions.   The patient was advised to call back or seek an in-person evaluation if the symptoms worsen or if the condition fails to improve as anticipated.  I provided 14 minutes of non-face-to-face time during this encounter.   [name], MD    [facility] MD/PA/NP OP Progress Note  09/02/2021 2:00 PM Alexis White  MRN:  [id number]  Chief Complaint:  Chief Complaint   Follow-up; ADHD    HPI:  This is a follow-up appointment for ADHD.  She states that she has been doing good.  Her middle daughter has puberty, and there are some conflicts between the older one.  Her husband is going to a nursing school to get a degree.  She thinks she has been handling things well except that she feels fatigue in the middle of the day.  She has good sleep.  She denies change in appetite.  She denies feeling depressed.  Although she feels anxious at times, she thinks it is normal considering the situation.  She has good concentration, and is able to sustain focus.  She feels comfortable to stay on the current dose of Adderall.  She rarely drinks alcohol.  She denies any drug use.   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 Reports good childhood  Wt Readings from Last 3 Encounters:  06/29/21 163 lb 6.4 oz (74.1 kg)  11/04/19 155 lb (70.3 kg)  04/09/19 155 lb (70.3 kg)       Visit Diagnosis:    ICD-10-CM   1. Attention deficit hyperactivity disorder (ADHD), predominantly inattentive type  F90.0       Past Psychiatric History: Please see initial evaluation for full details. I have reviewed the history. No updates at this time.     Past Medical History:  Past Medical History:  Diagnosis Date   Absence seizure (HCC)    Acute kidney failure following labor and delivery    Acute liver failure    Allergy    Anemia    Asthma    Complication of anesthesia    Depression    HELLP syndrome in first trimester 2009   HELLP syndrome in first trimester    Kidney infection    PONV (postoperative nausea and vomiting)    Seizures (HCC)     Past Surgical History:  Procedure Laterality Date   ABDOMINAL HYSTERECTOMY N/A 08/31/2016   Procedure: ABDOMINAL HYSTERECTOMY WITH REMOVAL OF CERVIX;  Surgeon: Lazaro Arms, MD;  Location: [facility];  Service: Gynecology;  Laterality: N/A;   BREAST ENHANCEMENT SURGERY  CESAREAN SECTION     SCAR REVISION  08/31/2016   Procedure: REVISION OF ABDOMINAL WALL SCAR;  Surgeon: Lazaro Arms, MD;  Location: [facility];  Service: Gynecology;;   TUBAL LIGATION      Family Psychiatric History: Please see initial evaluation for full details. I have reviewed the history. No updates at this time.     Family History:  Family History  Adopted: Yes  Problem Relation Age of Onset   Cancer Paternal Grandfather        bladder   Heart disease Paternal Grandmother    Heart disease Maternal Grandmother    Congestive Heart Failure Maternal Grandmother    Heart disease Father 55       "code blue"   Alcohol abuse  Father    Diabetes Father    Nevi Mother    Cancer Maternal Aunt        skin   Breast cancer Maternal Aunt    Heart disease Maternal Aunt        CHF   Breast cancer Maternal Aunt    Cancer Maternal Aunt        skin   Diabetes Other    Heart disease Other     Social History:  Social History   Socioeconomic History   Marital status: Married    Spouse name: Shaun   Number of children: 3   Years of education: 13   Highest education level: Not on file  Occupational History   Occupation: massage therapy    Comment: by schooling   Occupation: Bake art- cakes    Comment: self  Tobacco Use   Smoking status: Former    Types: E-cigarettes    Quit date: 11/01/2011    Years since quitting: 9.8   Smokeless tobacco: Never   Tobacco comments:    02-06-2017 PER PT [facility].  Vaping Use   Vaping Use: Every day  Substance and Sexual Activity   Alcohol use: No   Drug use: No   Sexual activity: Yes    Birth control/protection: Surgical    Comment: hysterectomy  Other Topics Concern   Not on file  Social History Narrative   Lives at home   Husband Shaun   Three daughters   Tries to exercise   Social Determinants of Health   Financial Resource Strain: Not on file  Food Insecurity: No Food Insecurity   Worried About Programme researcher, broadcasting/film/video in the Last Year: Never true   Ran Out of Food in the Last Year: Never true  Transportation Needs: No Transportation Needs   Lack of Transportation (Medical): No   Lack of Transportation (Non-Medical): No  Physical Activity: Insufficiently Active   Days of Exercise per Week: 2 days   Minutes of Exercise per Session: 40 min  Stress: No Stress Concern Present   Feeling of Stress : Not at all  Social Connections: Socially Integrated   Frequency of Communication with Friends and Family: More than three times a week   Frequency of Social Gatherings with Friends and Family: Once a week   Attends Religious Services: More than 4 times per year   Active  Member of [employer] or Organizations: Yes   Attends Banker Meetings: More than 4 times per year   Marital Status: Married    Allergies:  Allergies  Allergen Reactions   Ciprofloxacin Hcl Other (See Comments)    Silent seizures.   Amoxicillin     dizziness   Clindamycin/Lincomycin Nausea And  Vomiting    Intercrainial hypertention   Minocycline Swelling   Coppertone Kids Spf15 [Albolene] Hives and Swelling   Vicodin [Hydrocodone-Acetaminophen] Other (See Comments)    Makes her feel drunk and she prefers not to take this.    Metabolic Disorder Labs: No results found for: HGBA1C, MPG No results found for: PROLACTIN Lab Results  Component Value Date   CHOL 148 12/26/2016   TRIG 30 12/26/2016   HDL 50 (L) 12/26/2016   CHOLHDL 3.0 12/26/2016   VLDL 6 12/26/2016   LDLCALC 92 12/26/2016   Lab Results  Component Value Date   TSH 1.320 07/02/2021    Therapeutic Level Labs: No results found for: LITHIUM No results found for: VALPROATE No components found for:  CBMZ  Current Medications: Current Outpatient Medications  Medication Sig Dispense Refill   [START ON 09/13/2021] amphetamine-dextroamphetamine (ADDERALL XR) 30 MG 24 hr capsule Take 1 capsule (30 mg total) by mouth every morning. 30 capsule 0   [START ON 10/14/2021] amphetamine-dextroamphetamine (ADDERALL XR) 30 MG 24 hr capsule Take 1 capsule (30 mg total) by mouth every morning. 30 capsule 0   [START ON 11/14/2021] amphetamine-dextroamphetamine (ADDERALL XR) 30 MG 24 hr capsule Take 1 capsule (30 mg total) by mouth every morning. 30 capsule 0   amphetamine-dextroamphetamine (ADDERALL XR) 30 MG 24 hr capsule Take 1 capsule (30 mg total) by mouth every morning. 30 capsule 0   amphetamine-dextroamphetamine (ADDERALL XR) 30 MG 24 hr capsule Take 1 capsule (30 mg total) by mouth every morning. (Patient not taking: Reported on 06/29/2021) 30 capsule 0   amphetamine-dextroamphetamine (ADDERALL XR) 30 MG 24 hr capsule  Take 1 capsule (30 mg total) by mouth every morning. (Patient not taking: Reported on 06/29/2021) 30 capsule 0   azithromycin (ZITHROMAX) 250 MG tablet Take 1 tablet (250 mg total) by mouth daily. Take first 2 tablets together, then 1 every day until finished. (Patient not taking: Reported on 06/29/2021) 6 tablet 0   cephALEXin (KEFLEX) 500 MG capsule Take 1 capsule (500 mg total) by mouth 3 (three) times daily. (Patient not taking: No sig reported) 21 capsule 0   cetirizine-pseudoephedrine (ZYRTEC-D) 5-120 MG tablet Take 1 tablet by mouth daily. (Patient not taking: Reported on 06/29/2021) 30 tablet 0   diclofenac Sodium (VOLTAREN) 1 % GEL Apply topically 4 (four) times daily.     fluticasone (FLONASE) 50 MCG/ACT nasal spray Place 2 sprays into both nostrils daily. (Patient not taking: Reported on 06/29/2021) 9.9 mL 0   ibuprofen (ADVIL) 200 MG tablet Take 600 mg by mouth daily as needed for mild pain.  (Patient not taking: Reported on 06/29/2021)     minocycline (MINOCIN) 100 MG capsule Take 100 mg by mouth 2 (two) times daily. (Patient not taking: Reported on 06/29/2021)     No current facility-administered medications for this visit.     Musculoskeletal: Strength & Muscle Tone:  N/A Gait & Station:  N/A Patient leans: N/A  Psychiatric Specialty Exam: Review of Systems  Psychiatric/Behavioral:  Negative for agitation, behavioral problems, confusion, decreased concentration, dysphoric mood, hallucinations, self-injury, sleep disturbance and suicidal ideas. The patient is nervous/anxious. The patient is not hyperactive.   All other systems reviewed and are negative.  Last menstrual period 08/31/2016.There is no height or weight on file to calculate BMI.  General Appearance: Fairly Groomed  Eye Contact:  Good  Speech:  Clear and Coherent  Volume:  Normal  Mood:   good  Affect:  Appropriate and Congruent  Thought Process:  Coherent  Orientation:  Full (Time, Place, and Person)  Thought  Content: Logical   Suicidal Thoughts:  No  Homicidal Thoughts:  No  Memory:  Immediate;   Good  Judgement:  Good  Insight:  Good  Psychomotor Activity:  Normal  Concentration:  Concentration: Good and Attention Span: Good  Recall:  Good  Fund of Knowledge: Good  Language: Good  Akathisia:  No  Handed:  Right  AIMS (if indicated): not done  Assets:  Communication Skills Desire for Improvement  ADL's:  Intact  Cognition: WNL  Sleep:  Good   Screenings: GAD-7    Flowsheet Row Office Visit from 06/29/2021 in [facility]  Total GAD-7 Score 3      PHQ2-9    Flowsheet Row Office Visit from 06/29/2021 in [facility] Video Visit from 03/05/2021 in [facility]  PHQ-2 Total Score 0 0  PHQ-9 Total Score 4 --        Assessment and Plan:  Alexis White is a 35 y.o. year old female with a history of  ADHD, who presents for follow up appointment for below.   1. Attention deficit hyperactivity disorder (ADHD), predominantly inattentive type She reports good benefit from Adderall XR, and denies any significant issues with ADHD symptoms.  We will continue current dose to target ADHD.   This clinician has discussed the side effect associated with medication prescribed during this encounter. Please refer to notes in the previous encounters for more details.     Plan  I have reviewed and updated plans as below  1. Continue Adderall XR 30 mg daily     2. Next appointment: 1/31 at 1:40  for 20 mins, video  I have utilized the [place] Controlled Substances Reporting System (PMP AWARxE) to confirm adherence regarding the patient's medication. My review reveals appropriate prescription fills.     The patient demonstrates the following risk factors for suicide: Chronic risk factors for suicide include: N/A. Acute risk factors for suicide include: N/A. Protective factors for this patient include: positive social support, responsibility to others  (children, family), coping skills and hope for the future. Considering these factors, the overall suicide risk at this point appears to be low. Patient is appropriate for outpatient follow up.    [name], MD 09/02/2021, 2:00 PM

## 2021-09-02 ENCOUNTER — Telehealth (INDEPENDENT_AMBULATORY_CARE_PROVIDER_SITE_OTHER): Payer: 59 | Admitting: Psychiatry

## 2021-09-02 ENCOUNTER — Encounter: Payer: Self-pay | Admitting: Psychiatry

## 2021-09-02 ENCOUNTER — Other Ambulatory Visit: Payer: Self-pay

## 2021-09-02 DIAGNOSIS — F900 Attention-deficit hyperactivity disorder, predominantly inattentive type: Secondary | ICD-10-CM

## 2021-09-02 MED ORDER — AMPHETAMINE-DEXTROAMPHET ER 30 MG PO CP24: 30.0000 mg | ORAL_CAPSULE | ORAL | 0 refills | Status: DC

## 2021-09-02 NOTE — Patient Instructions (Signed)
1. Continue Adderall XR 30 mg daily     2. Next appointment: 1/31 at 1:40

## 2021-11-26 NOTE — Progress Notes (Signed)
Virtual Visit via Video Note  I connected with Alexis White on 11/30/21 at  1:40 PM EST by a video enabled telemedicine application and verified that I am speaking with the correct person using two identifiers.  Location: Patient: home Provider: office Persons participated in the visit- patient, provider    I discussed the limitations of evaluation and management by telemedicine and the availability of in person appointments. The patient expressed understanding and agreed to proceed.    I discussed the assessment and treatment plan with the patient. The patient was provided an opportunity to ask questions and all were answered. The patient agreed with the plan and demonstrated an understanding of the instructions.   The patient was advised to call back or seek an in-person evaluation if the symptoms worsen or if the condition fails to improve as anticipated.  I provided 10 minutes of non-face-to-face time during this encounter.   Neysa Hottereina Yoltzin Barg, MD    [facility] MD/PA/NP OP Progress Note  11/30/2021 2:06 PM Alexis White  MRN:  [id number]  Chief Complaint:  Chief Complaint   Follow-up; ADHD    HPI:  This is a follow-up appointment for ADHD.  She states that she has been doing well.  She had a good holiday with her family.  She occasionally contacts with her mother, and reports fair relationship by limiting the contact with her.  Her daughters are doing very well.  She has been able to manage things well, and she also takes care of husky, who she got on her birthday.  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  She denies feeling depressed or anhedonia.  She denies anxiety. she has good appetite.  She is physically active while taking care of things at home.  She denies SI.  She drank a few couple of glasses of wine during holiday.  She does not drink otherwise as she does not like it.  She denies any drug use.  She feels comfortable to  stay on her current medication.   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 Reports good childhood  Visit Diagnosis:    ICD-10-CM   1. Attention deficit hyperactivity disorder (ADHD), predominantly inattentive type  F90.0       Past Psychiatric History: Please see initial evaluation for full details. I have reviewed the history. No updates at this time.     Past Medical History:  Past Medical History:  Diagnosis Date   Absence seizure (HCC)    Acute kidney failure following labor and delivery    Acute liver failure    Allergy    Anemia    Asthma    Complication of anesthesia    Depression    HELLP syndrome in first trimester 2009   HELLP syndrome in first trimester    Kidney infection    PONV (postoperative nausea and vomiting)    Seizures (HCC)     Past Surgical History:  Procedure Laterality Date   ABDOMINAL HYSTERECTOMY N/A 08/31/2016   Procedure: ABDOMINAL HYSTERECTOMY WITH REMOVAL OF CERVIX;  Surgeon: [name], MD;  Location: [facility];  Service: Gynecology;  Laterality: N/A;   BREAST ENHANCEMENT SURGERY     CESAREAN SECTION  SCAR REVISION  08/31/2016   Procedure: REVISION OF ABDOMINAL WALL SCAR;  Surgeon: Lazaro Arms, MD;  Location: [facility];  Service: Gynecology;;   TUBAL LIGATION      Family Psychiatric History: Please see initial evaluation for full details. I have reviewed the history. No updates at this time.    Family History:  Family History  Adopted: Yes  Problem Relation Age of Onset   Cancer Paternal Grandfather        bladder   Heart disease Paternal Grandmother    Heart disease Maternal Grandmother    Congestive Heart Failure Maternal Grandmother    Heart disease Father 69       "code blue"   Alcohol abuse Father     Diabetes Father    Nevi Mother    Cancer Maternal Aunt        skin   Breast cancer Maternal Aunt    Heart disease Maternal Aunt        CHF   Breast cancer Maternal Aunt    Cancer Maternal Aunt        skin   Diabetes Other    Heart disease Other     Social History:  Social History   Socioeconomic History   Marital status: Married    Spouse name: Shaun   Number of children: 3   Years of education: 13   Highest education level: Not on file  Occupational History   Occupation: massage therapy    Comment: by schooling   Occupation: Bake art- cakes    Comment: self  Tobacco Use   Smoking status: Former    Types: E-cigarettes    Quit date: 11/01/2011    Years since quitting: 10.0   Smokeless tobacco: Never   Tobacco comments:    02-06-2017 PER PT [facility].  Vaping Use   Vaping Use: Every day  Substance and Sexual Activity   Alcohol use: No   Drug use: No   Sexual activity: Yes    Birth control/protection: Surgical    Comment: hysterectomy  Other Topics Concern   Not on file  Social History Narrative   Lives at home   Husband Shaun   Three daughters   Tries to exercise   Social Determinants of Health   Financial Resource Strain: Not on file  Food Insecurity: No Food Insecurity   Worried About Programme researcher, broadcasting/film/video in the Last Year: Never true   Ran Out of Food in the Last Year: Never true  Transportation Needs: No Transportation Needs   Lack of Transportation (Medical): No   Lack of Transportation (Non-Medical): No  Physical Activity: Insufficiently Active   Days of Exercise per Week: 2 days   Minutes of Exercise per Session: 40 min  Stress: No Stress Concern Present   Feeling of Stress : Not at all  Social Connections: Socially Integrated   Frequency of Communication with Friends and Family: More than three times a week   Frequency of Social Gatherings with Friends and Family: Once a week   Attends Religious Services: More than 4 times per year   Active Member of  [employer] or Organizations: Yes   Attends Banker Meetings: More than 4 times per year   Marital Status: Married    Allergies:  Allergies  Allergen Reactions   Ciprofloxacin Hcl Other (See Comments)    Silent seizures.   Amoxicillin     dizziness   Clindamycin/Lincomycin Nausea And Vomiting    Intercrainial hypertention  Minocycline Swelling   Coppertone Kids Spf15 [Albolene] Hives and Swelling   Vicodin [Hydrocodone-Acetaminophen] Other (See Comments)    Makes her feel drunk and she prefers not to take this.    Metabolic Disorder Labs: No results found for: HGBA1C, MPG No results found for: PROLACTIN Lab Results  Component Value Date   CHOL 148 12/26/2016   TRIG 30 12/26/2016   HDL 50 (L) 12/26/2016   CHOLHDL 3.0 12/26/2016   VLDL 6 12/26/2016   LDLCALC 92 12/26/2016   Lab Results  Component Value Date   TSH 1.320 07/02/2021    Therapeutic Level Labs: No results found for: LITHIUM No results found for: VALPROATE No components found for:  CBMZ  Current Medications: Current Outpatient Medications  Medication Sig Dispense Refill   [START ON 12/15/2021] amphetamine-dextroamphetamine (ADDERALL XR) 30 MG 24 hr capsule Take 1 capsule (30 mg total) by mouth every morning. 30 capsule 0   [START ON 01/15/2022] amphetamine-dextroamphetamine (ADDERALL XR) 30 MG 24 hr capsule Take 1 capsule (30 mg total) by mouth every morning. 30 capsule 0   [START ON 02/14/2022] amphetamine-dextroamphetamine (ADDERALL XR) 30 MG 24 hr capsule Take 1 capsule (30 mg total) by mouth every morning. 30 capsule 0   amphetamine-dextroamphetamine (ADDERALL XR) 30 MG 24 hr capsule Take 1 capsule (30 mg total) by mouth every morning. 30 capsule 0   No current facility-administered medications for this visit.     Musculoskeletal: Strength & Muscle Tone:  N/A Gait & Station:  N/A Patient leans: N/A  Psychiatric Specialty Exam: Review of Systems  Psychiatric/Behavioral:  Negative for  agitation, behavioral problems, confusion, decreased concentration, dysphoric mood, hallucinations, self-injury, sleep disturbance and suicidal ideas. The patient is not nervous/anxious and is not hyperactive.   All other systems reviewed and are negative.  Last menstrual period 08/31/2016.There is no height or weight on file to calculate BMI.  General Appearance: Fairly Groomed  Eye Contact:  Good  Speech:  Clear and Coherent  Volume:  Normal  Mood:   good  Affect:  Appropriate, Congruent, and euthymic  Thought Process:  Coherent  Orientation:  Full (Time, Place, and Person)  Thought Content: Logical   Suicidal Thoughts:  No  Homicidal Thoughts:  No  Memory:  Immediate;   Good  Judgement:  Good  Insight:  Good  Psychomotor Activity:  Normal  Concentration:  Concentration: Good and Attention Span: Good  Recall:  Good  Fund of Knowledge: Good  Language: Good  Akathisia:  No  Handed:  Right  AIMS (if indicated): not done  Assets:  Communication Skills Desire for Improvement  ADL's:  Intact  Cognition: WNL  Sleep:  Good   Screenings: GAD-7    Flowsheet Row [facility]  Total GAD-7 Score 3      PHQ2-9    Flowsheet Row [facility] Video Visit from [facility]/20[facility]  PHQ-2 Total Score 0 0  PHQ-9 Total Score 4 --        Assessment and Plan:  Alexis White is a 36 y.o. year old female with a history of ADHD, who presents for follow up appointment for below.   1. Attention deficit hyperactivity disorder (ADHD), predominantly inattentive type She reports good benefit from Adderall XR, and denies any side effect.  Will continue current dose to target ADHD.   This clinician has discussed the side effect associated with medication prescribed during this  encounter. Please refer to notes in the previous encounters for more details.     Plan  Continue  Adderall XR 30 mg daily   Next appointment: 5/8 at 11 Am, in person visit   I have utilized the [place] Controlled Substances Reporting System (PMP AWARxE) to confirm adherence regarding the patient's medication. My review reveals appropriate prescription fills.    The patient demonstrates the following risk factors for suicide: Chronic risk factors for suicide include: N/A. Acute risk factors for suicide include: N/A. Protective factors for this patient include: positive social support, responsibility to others (children, family), coping skills and hope for the future. Considering these factors, the overall suicide risk at this point appears to be low. Patient is appropriate for outpatient follow up.      [name], MD 11/30/2021, 2:06 PM

## 2021-11-30 ENCOUNTER — Telehealth (INDEPENDENT_AMBULATORY_CARE_PROVIDER_SITE_OTHER): Payer: 59 | Admitting: Psychiatry

## 2021-11-30 ENCOUNTER — Encounter: Payer: Self-pay | Admitting: Psychiatry

## 2021-11-30 ENCOUNTER — Other Ambulatory Visit: Payer: Self-pay

## 2021-11-30 DIAGNOSIS — F900 Attention-deficit hyperactivity disorder, predominantly inattentive type: Secondary | ICD-10-CM | POA: Diagnosis not present

## 2021-11-30 MED ORDER — AMPHETAMINE-DEXTROAMPHET ER 30 MG PO CP24: 30.0000 mg | ORAL_CAPSULE | ORAL | 0 refills | Status: DC

## 2021-11-30 NOTE — Patient Instructions (Signed)
Continue Adderall XR 30 mg daily   Next appointment: 5/8 at 11 AM, in person  The next visit will be in person visit. Please arrive 15 mins before the scheduled time.   [facility]  Address: [address], [place], [place] [address]

## 2022-01-05 ENCOUNTER — Encounter: Payer: Self-pay | Admitting: Urology

## 2022-01-05 ENCOUNTER — Ambulatory Visit: Payer: 59 | Admitting: Urology

## 2022-01-05 ENCOUNTER — Other Ambulatory Visit: Payer: Self-pay

## 2022-01-05 VITALS — BP 125/85 | HR 96 | Ht 63.0 in | Wt 162.0 lb

## 2022-01-05 DIAGNOSIS — R319 Hematuria, unspecified: Secondary | ICD-10-CM

## 2022-01-05 DIAGNOSIS — R350 Frequency of micturition: Secondary | ICD-10-CM | POA: Diagnosis not present

## 2022-01-05 DIAGNOSIS — R109 Unspecified abdominal pain: Secondary | ICD-10-CM

## 2022-01-05 LAB — URINALYSIS, ROUTINE W REFLEX MICROSCOPIC
Bilirubin, UA: NEGATIVE
Glucose, UA: NEGATIVE
Ketones, UA: NEGATIVE
Leukocytes,UA: NEGATIVE
Nitrite, UA: NEGATIVE
Protein,UA: NEGATIVE
RBC, UA: NEGATIVE
Specific Gravity, UA: 1.02 (ref 1.005–1.030)
Urobilinogen, Ur: 0.2 mg/dL (ref 0.2–1.0)
pH, UA: 7 (ref 5.0–7.5)

## 2022-01-05 LAB — BLADDER SCAN AMB NON-IMAGING: Scan Result: 56

## 2022-01-05 NOTE — Progress Notes (Signed)
MDX tracking # [id number] ?Pick up # [id number] ?

## 2022-01-05 NOTE — Progress Notes (Signed)
? ?Assessment: ?1. Urinary frequency   ?2. Hematuria - dipstick only   ?3. Flank pain   ? ? ? ?Plan: ?I reviewed the patient's chart including her recent lab results from Dr. Sherwood Gambler as well as urine culture results going back to 2014.  I could not find any imaging study results. ?I discussed the typical findings of a UTI and pyelonephritis including abnormal urinalysis results and positive urine culture results.  Her history of prior pyelonephritis is currently unclear. ?Resolve Mdx culture sent today ?CT renal stone protocol ordered - will call with results ?I discussed the diagnosis and management of overactive bladder symptoms.  She is not interested in any treatment at this time. ? ? ?Chief Complaint:  ?Chief Complaint  ?Patient presents with  ? Hematuria  ? ? ?History of Present Illness: ? ?Alexis White is a 36 y.o. year old female who is seen in consultation from Elfredia Nevins, MD for evaluation of hematuria and urinary frequency.  She reports a history of frequent daytime urination for the past 3 years.  She voids approximately every hour.  She does have some urgency.  No incontinence.  No dysuria or gross hematuria.  She has nocturia on occasion x1.  No prior medical therapy for her bladder symptoms. ?She recently had a dipstick urinalysis done which showed trace blood.  No microscopic urinalysis was performed. ?She reports a history of "kidney infections".  She states that her urine test are always negative but she requires IV antibiotics for treatment.  She has had difficulty tolerating oral antibiotics previously.  She is complaining of bilateral flank pain, right greater than left for several days.  No fevers or chills.  She was last hospitalized for a presumed kidney infection in 2014.  She reports that her symptoms typically start as back pain and she then develops fever and chills. ?Review of her chart shows negative urine cultures in 2014 and in 2020. ? ? ?Past Medical History:  ?Past Medical  History:  ?Diagnosis Date  ? Absence seizure (HCC)   ? Acute kidney failure following labor and delivery   ? Acute liver failure   ? Allergy   ? Anemia   ? Asthma   ? Complication of anesthesia   ? Depression   ? HELLP syndrome in first trimester 2009  ? HELLP syndrome in first trimester   ? Kidney infection   ? PONV (postoperative nausea and vomiting)   ? Seizures (HCC)   ? ? ?Past Surgical History:  ?Past Surgical History:  ?Procedure Laterality Date  ? ABDOMINAL HYSTERECTOMY N/A 08/31/2016  ? Procedure: ABDOMINAL HYSTERECTOMY WITH REMOVAL OF CERVIX;  Surgeon: Lazaro Arms, MD;  Location: [facility];  Service: Gynecology;  Laterality: N/A;  ? BREAST ENHANCEMENT SURGERY    ? CESAREAN SECTION    ? SCAR REVISION  08/31/2016  ? Procedure: REVISION OF ABDOMINAL WALL SCAR;  Surgeon: Lazaro Arms, MD;  Location: [facility];  Service: Gynecology;;  ? TUBAL LIGATION    ? ? ?Allergies:  ?Allergies  ?Allergen Reactions  ? Ciprofloxacin Hcl Other (See Comments)  ?  Silent seizures.  ? Amoxicillin   ?  dizziness  ? Clindamycin/Lincomycin Nausea And Vomiting  ?  Intercrainial hypertention  ? Doxycycline   ?  Headache, Dizziness, swelling of eyelids  ? Minocycline Swelling  ? Coppertone Kids Spf15 [Albolene] Hives and Swelling  ? Vicodin [Hydrocodone-Acetaminophen] Other (See Comments)  ?  Makes her feel drunk and she prefers not to take this.  ? ? ?  Family History:  ?Family History  ?Adopted: Yes  ?Problem Relation Age of Onset  ? Cancer Paternal Grandfather   ?     bladder  ? Heart disease Paternal Grandmother   ? Heart disease Maternal Grandmother   ? Congestive Heart Failure Maternal Grandmother   ? Heart disease Father 58  ?     "code blue"  ? Alcohol abuse Father   ? Diabetes Father   ? Nevi Mother   ? Cancer Maternal Aunt   ?     skin  ? Breast cancer Maternal Aunt   ? Heart disease Maternal Aunt   ?     CHF  ? Breast cancer Maternal Aunt   ? Cancer Maternal Aunt   ?     skin  ? Diabetes Other   ? Heart disease Other   ? ? ?Social  History:  ?Social History  ? ?Tobacco Use  ? Smoking status: Former  ?  Types: E-cigarettes  ?  Quit date: 11/01/2011  ?  Years since quitting: 10.1  ? Smokeless tobacco: Never  ? Tobacco comments:  ?  02-06-2017 PER PT [facility].  ?Vaping Use  ? Vaping Use: Every day  ?Substance Use Topics  ? Alcohol use: No  ? Drug use: No  ? ? ?Review of symptoms:  ?Constitutional:  Negative for unexplained weight loss, night sweats, fever, chills ?ENT:  Negative for nose bleeds, sinus pain, painful swallowing ?CV:  Negative for chest pain, shortness of breath, exercise intolerance, palpitations, loss of consciousness ?Resp:  Negative for cough, wheezing, shortness of breath ?GI:  Negative for nausea, vomiting, diarrhea, bloody stools ?GU:  Positives noted in HPI; otherwise negative for gross hematuria, dysuria, urinary incontinence ?Neuro:  Negative for seizures, poor balance, limb weakness, slurred speech ?Psych:  Negative for lack of energy, depression, anxiety ?Endocrine:  Negative for polydipsia, polyuria, symptoms of hypoglycemia (dizziness, hunger, sweating) ?Hematologic:  Negative for anemia, purpura, petechia, prolonged or excessive bleeding, use of anticoagulants  ?Allergic:  Negative for difficulty breathing or choking as a result of exposure to anything; no shellfish allergy; no allergic response (rash/itch) to materials, foods ? ?Physical exam: ?BP 125/85   Pulse 96   Ht 5\' 3"  (1.6 m)   Wt 162 lb (73.5 kg)   LMP 08/31/2016 (Exact Date)   BMI 28.70 kg/m?  ?GENERAL APPEARANCE:  Well appearing, well developed, well nourished, NAD ?HEENT: Atraumatic, Normocephalic, oropharynx clear. ?NECK: Supple without lymphadenopathy or thyromegaly. ?LUNGS: Clear to auscultation bilaterally. ?HEART: Regular Rate and Rhythm without murmurs, gallops, or rubs. ?ABDOMEN: Soft, non-tender, No Masses. ?EXTREMITIES: Moves all extremities well.  Without clubbing, cyanosis, or edema. ?NEUROLOGIC:  Alert and oriented x 3, normal gait, CN II-XII  grossly intact.  ?MENTAL STATUS:  Appropriate. ?BACK:  Non-tender to palpation.  No CVAT ?SKIN:  Warm, dry and intact.   ? ?Results: ?U/A dipstick negative ?Results for orders placed or performed in visit on 01/05/22 (from the past 24 hour(s))  ?BLADDER SCAN AMB NON-IMAGING  ? Collection Time: 01/05/22 11:55 AM  ?Result Value Ref Range  ? Scan Result 56   ? ? ?

## 2022-01-06 ENCOUNTER — Ambulatory Visit (HOSPITAL_COMMUNITY)
Admission: RE | Admit: 2022-01-06 | Discharge: 2022-01-06 | Disposition: A | Discharge status: Home / Self Care | Payer: 59 | Source: Ambulatory Visit | Attending: Urology | Admitting: Urology

## 2022-01-06 ENCOUNTER — Other Ambulatory Visit: Payer: Self-pay | Admitting: Urology

## 2022-01-06 ENCOUNTER — Encounter: Payer: Self-pay | Admitting: Urology

## 2022-01-06 DIAGNOSIS — R109 Unspecified abdominal pain: Secondary | ICD-10-CM

## 2022-01-06 NOTE — Telephone Encounter (Signed)
Please advise on patient question

## 2022-01-07 ENCOUNTER — Other Ambulatory Visit: Payer: Self-pay | Admitting: Urology

## 2022-01-07 ENCOUNTER — Encounter: Payer: Self-pay | Admitting: Urology

## 2022-01-18 ENCOUNTER — Telehealth: Payer: Self-pay

## 2022-01-18 NOTE — Telephone Encounter (Signed)
Discussed with the patient. She will call us back whether she would like to take Concerta or Vyvanse (available at the [facility]). Please get that message from her when she calls you.  ? ?

## 2022-01-18 NOTE — Telephone Encounter (Signed)
pt called states she needs to speak with you about medication she is not able to find medications so she wants to speak wih you about her options.  Please call her back as soon as possible.  ?

## 2022-01-19 ENCOUNTER — Other Ambulatory Visit: Payer: Self-pay | Admitting: Psychiatry

## 2022-01-19 MED ORDER — VYVANSE 30 MG PO CAPS: 30.0000 mg | ORAL_CAPSULE | Freq: Every day | ORAL | 0 refills | Status: DC

## 2022-01-19 NOTE — Telephone Encounter (Signed)
Ordered brand Vyvanse 30 mg daily.

## 2022-01-19 NOTE — Telephone Encounter (Signed)
pt called back she states the vyvanse is what she prefer  ?

## 2022-02-07 ENCOUNTER — Ambulatory Visit
Admission: EM | Admit: 2022-02-07 | Discharge: 2022-02-07 | Disposition: A | Discharge status: Home / Self Care | Payer: 59 | Attending: Urgent Care | Admitting: Urgent Care

## 2022-02-07 ENCOUNTER — Ambulatory Visit (INDEPENDENT_AMBULATORY_CARE_PROVIDER_SITE_OTHER): Payer: 59

## 2022-02-07 DIAGNOSIS — R059 Cough, unspecified: Secondary | ICD-10-CM | POA: Diagnosis not present

## 2022-02-07 DIAGNOSIS — R053 Chronic cough: Secondary | ICD-10-CM

## 2022-02-07 DIAGNOSIS — Z8709 Personal history of other diseases of the respiratory system: Secondary | ICD-10-CM

## 2022-02-07 DIAGNOSIS — Z720 Tobacco use: Secondary | ICD-10-CM

## 2022-02-07 DIAGNOSIS — J209 Acute bronchitis, unspecified: Secondary | ICD-10-CM | POA: Diagnosis not present

## 2022-02-07 DIAGNOSIS — Z87898 Personal history of other specified conditions: Secondary | ICD-10-CM | POA: Diagnosis not present

## 2022-02-07 DIAGNOSIS — J4520 Mild intermittent asthma, uncomplicated: Secondary | ICD-10-CM

## 2022-02-07 DIAGNOSIS — J3489 Other specified disorders of nose and nasal sinuses: Secondary | ICD-10-CM

## 2022-02-07 DIAGNOSIS — R509 Fever, unspecified: Secondary | ICD-10-CM

## 2022-02-07 MED ORDER — PREDNISONE 20 MG PO TABS: ORAL_TABLET | ORAL | 0 refills | Status: DC

## 2022-02-07 NOTE — ED Provider Notes (Signed)
?[facility] ? ? ?MRN: [id number] DOB: June 13, 1986 ? ?Subjective:  ? ?Alexis White is a 36 y.o. female presenting for 1 month history of persistent productive cough, hoarseness and scratchy throat.  Patient has also had significant fatigue.  She did a video visit and was advised to come in for in person visit.  She does have a history of allergic asthma, typically affects her in the fall.  Has not had trouble with this recently.  She has significant allergies with medications including doxycycline, Cipro, amoxicillin, clindamycin. ? ?No current facility-administered medications for this encounter. ? ?Current Outpatient Medications:  ?  amphetamine-dextroamphetamine (ADDERALL XR) 30 MG 24 hr capsule, Take 1 capsule (30 mg total) by mouth every morning., Disp: 30 capsule, Rfl: 0 ?  amphetamine-dextroamphetamine (ADDERALL XR) 30 MG 24 hr capsule, Take 1 capsule (30 mg total) by mouth every morning., Disp: 30 capsule, Rfl: 0 ?  amphetamine-dextroamphetamine (ADDERALL XR) 30 MG 24 hr capsule, Take 1 capsule (30 mg total) by mouth every morning., Disp: 30 capsule, Rfl: 0 ?  [START ON 02/14/2022] amphetamine-dextroamphetamine (ADDERALL XR) 30 MG 24 hr capsule, Take 1 capsule (30 mg total) by mouth every morning., Disp: 30 capsule, Rfl: 0 ?  VYVANSE 30 MG capsule, Take 1 capsule (30 mg total) by mouth daily., Disp: 30 capsule, Rfl: 0 ?  [START ON 02/19/2022] VYVANSE 30 MG capsule, Take 1 capsule (30 mg total) by mouth daily., Disp: 30 capsule, Rfl: 0  ? ?Allergies  ?Allergen Reactions  ? Ciprofloxacin Hcl Other (See Comments)  ?  Silent seizures.  ? Amoxicillin   ?  dizziness  ? Clindamycin/Lincomycin Nausea And Vomiting  ?  Intercrainial hypertention  ? Doxycycline   ?  Headache, Dizziness, swelling of eyelids  ? Minocycline Swelling  ? Coppertone Kids Spf15 [Albolene] Hives and Swelling  ? Vicodin [Hydrocodone-Acetaminophen] Other (See Comments)  ?  Makes her feel drunk and she prefers not to take this.   ? ? ?Past Medical History:  ?Diagnosis Date  ? Absence seizure ([place])   ? Acute kidney failure following labor and delivery   ? Acute liver failure   ? Allergy   ? Anemia   ? Asthma   ? Complication of anesthesia   ? Depression   ? HELLP syndrome in first trimester 2009  ? HELLP syndrome in first trimester   ? Kidney infection   ? PONV (postoperative nausea and vomiting)   ? Seizures ([place])   ?  ? ?Past Surgical History:  ?Procedure Laterality Date  ? ABDOMINAL HYSTERECTOMY N/A 08/31/2016  ? Procedure: ABDOMINAL HYSTERECTOMY WITH REMOVAL OF CERVIX;  Surgeon: Florian Buff, MD;  Location: [facility];  Service: Gynecology;  Laterality: N/A;  ? BREAST ENHANCEMENT SURGERY    ? CESAREAN SECTION    ? SCAR REVISION  08/31/2016  ? Procedure: REVISION OF ABDOMINAL WALL SCAR;  Surgeon: Florian Buff, MD;  Location: [facility];  Service: Gynecology;;  ? TUBAL LIGATION    ? ? ?Family History  ?Adopted: Yes  ?Problem Relation Age of Onset  ? Cancer Paternal Grandfather   ?     bladder  ? Heart disease Paternal Grandmother   ? Heart disease Maternal Grandmother   ? Congestive Heart Failure Maternal Grandmother   ? Heart disease Father 22  ?     "code blue"  ? Alcohol abuse Father   ? Diabetes Father   ? Nevi Mother   ? Cancer Maternal Aunt   ?  skin  ? Breast cancer Maternal Aunt   ? Heart disease Maternal Aunt   ?     CHF  ? Breast cancer Maternal Aunt   ? Cancer Maternal Aunt   ?     skin  ? Diabetes Other   ? Heart disease Other   ? ? ?Social History  ? ?Tobacco Use  ? Smoking status: Former  ?  Types: E-cigarettes  ?  Quit date: 11/01/2011  ?  Years since quitting: 10.2  ? Smokeless tobacco: Never  ? Tobacco comments:  ?  02-06-2017 PER PT [facility].  ?Vaping Use  ? Vaping Use: Every day  ?Substance Use Topics  ? Alcohol use: No  ? Drug use: No  ? ? ?ROS ? ? ?Objective:  ? ?Vitals: ?BP 121/86   Pulse (!) 106   Temp 98.1 ?F (36.7 ?C)   Resp 18   LMP 08/31/2016 (Exact Date)   SpO2 98%  ? ?Physical Exam ?Constitutional:   ?   General: She  is not in acute distress. ?   Appearance: Normal appearance. She is well-developed and normal weight. She is not ill-appearing, toxic-appearing or diaphoretic.  ?HENT:  ?   Head: Normocephalic and atraumatic.  ?   Right Ear: Tympanic membrane, ear canal and external ear normal. No drainage or tenderness. No middle ear effusion. There is no impacted cerumen. Tympanic membrane is not erythematous.  ?   Left Ear: Tympanic membrane, ear canal and external ear normal. No drainage or tenderness.  No middle ear effusion. There is no impacted cerumen. Tympanic membrane is not erythematous.  ?   Nose: Nose normal. No congestion or rhinorrhea.  ?   Mouth/Throat:  ?   Mouth: Mucous membranes are moist. No oral lesions.  ?   Pharynx: No pharyngeal swelling, oropharyngeal exudate, posterior oropharyngeal erythema or uvula swelling.  ?   Tonsils: No tonsillar exudate or tonsillar abscesses.  ?Eyes:  ?   General: No scleral icterus.    ?   Right eye: No discharge.     ?   Left eye: No discharge.  ?   Extraocular Movements: Extraocular movements intact.  ?   Right eye: Normal extraocular motion.  ?   Left eye: Normal extraocular motion.  ?   Conjunctiva/sclera: Conjunctivae normal.  ?Cardiovascular:  ?   Rate and Rhythm: Normal rate.  ?   Heart sounds: No murmur heard. ?  No friction rub. No gallop.  ?Pulmonary:  ?   Effort: Pulmonary effort is normal. No respiratory distress.  ?   Breath sounds: No stridor. Examination of the right-middle field reveals decreased breath sounds. Examination of the left-middle field reveals decreased breath sounds. Examination of the right-lower field reveals decreased breath sounds. Examination of the left-lower field reveals decreased breath sounds. Decreased breath sounds present. No wheezing, rhonchi or rales.  ?Chest:  ?   Chest wall: No tenderness.  ?Musculoskeletal:  ?   Cervical back: Normal range of motion and neck supple.  ?Lymphadenopathy:  ?   Cervical: No cervical adenopathy.  ?Skin: ?    General: Skin is warm and dry.  ?Neurological:  ?   General: No focal deficit present.  ?   Mental Status: She is alert and oriented to person, place, and time.  ?Psychiatric:     ?   Mood and Affect: Mood normal.     ?   Behavior: Behavior normal.  ? ?DG Chest 2 View ? ?Result Date: 02/07/2022 ?CLINICAL DATA:  Persistent  cough over the last month EXAM: CHEST - 2 VIEW COMPARISON:  None. FINDINGS: Heart and mediastinal shadows are normal. There may be mild central bronchial thickening but there is no infiltrate, collapse or effusion. No abnormal bone finding. IMPRESSION: Possible bronchitis.  No infiltrate or collapse. Electronically Signed   By: Nelson Chimes M.D.   On: 02/07/2022 13:05   ? ?Assessment and Plan :  ? ?PDMP not reviewed this encounter. ? ?1. Acute bronchitis, unspecified organism   ?2. History of asthma   ?3. History of seizures   ?4. Sinus pressure   ?5. Fever, unspecified   ?6. Persistent cough   ?7. Mild intermittent extrinsic asthma without complication   ?8. Vapes nicotine containing substance   ? ?Recommended a oral prednisone course for acute bronchitis assisted by enlarge inflammatory and/or viral.  Advised patient to hold off from vaping as much as possible.  Use supportive care otherwise.  Patient declined any further prescriptions to avoid any possible reactions. Counseled patient on potential for adverse effects with medications prescribed/recommended today, ER and return-to-clinic precautions discussed, patient verbalized understanding. ? ?  ?Jaynee Eagles, PA-C ?02/07/22 1328 ? ?

## 2022-02-07 NOTE — ED Triage Notes (Signed)
Pt presents with cough that first began about a month ago, did tele health visit and was advised to be seen in person  ?

## 2022-03-04 NOTE — Progress Notes (Signed)
[facility] MD/PA/NP OP Progress Note ? ?03/07/2022 11:36 AM ?Alexis White  ?MRN:  [id number] ? ?Chief Complaint:  ?Chief Complaint  ?Patient presents with  ? Follow-up  ? ?HPI:  ?This is a follow-up appointment for ADHD.  ?She states that she has been doing well.  She enjoyed a trip to [place] with her family.  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  She notices that she has been feeling more fatigued later in the day since switching to Vyvanse.  She definitely prefers Adderall XR when it is available.  She has been able to do multitasking and reports good concentration otherwise.  She states well.  She denies feeling depressed or anhedonia.  She denies anxiety.  She denies alcohol use or drug use.  She is willing to try higher dose of vyvanse at this time.  ? ? 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 Reports good childhood ? ?Wt Readings from Last 3 Encounters:  ?03/07/22 167 lb 6.4 oz (75.9 kg)  ?01/05/22 162 lb (73.5 kg)  ?06/29/21 163 lb 6.4 oz (74.1 kg)  ?  ?Visit Diagnosis:  ?  ICD-10-CM   ?1. Attention deficit hyperactivity disorder (ADHD), predominantly inattentive type  F90.0   ?  ? ? ?Past Psychiatric History: Please see initial evaluation for full details. I have reviewed the history. No updates at this time.  ?  ? ?Past Medical History:  ?Past Medical History:  ?Diagnosis Date  ? Absence seizure ([place])   ? Acute kidney failure following labor and delivery   ? Acute liver failure   ? Allergy   ? Anemia   ? Asthma   ? Complication of anesthesia   ? Depression   ? HELLP syndrome in first trimester 2009  ? HELLP syndrome in first trimester   ? Kidney infection   ? PONV  (postoperative nausea and vomiting)   ? Seizures ([place])   ?  ?Past Surgical History:  ?Procedure Laterality Date  ? ABDOMINAL HYSTERECTOMY N/A 08/31/2016  ? Procedure: ABDOMINAL HYSTERECTOMY WITH REMOVAL OF CERVIX;  Surgeon: Florian Buff, MD;  Location: [facility];  Service: Gynecology;  Laterality: N/A;  ? BREAST ENHANCEMENT SURGERY    ? CESAREAN SECTION    ? SCAR REVISION  08/31/2016  ? Procedure: REVISION OF ABDOMINAL WALL SCAR;  Surgeon: Florian Buff, MD;  Location: [facility];  Service: Gynecology;;  ? TUBAL LIGATION    ? ? ?Family Psychiatric History: Please see initial evaluation for full details. I have reviewed the history. No updates at this time.  ?  ? ?Family History:  ?Family History  ?Adopted: Yes  ?Problem Relation Age of Onset  ? Cancer Paternal Grandfather   ?     bladder  ? Heart disease Paternal Grandmother   ? Heart disease Maternal Grandmother   ? Congestive Heart Failure Maternal Grandmother   ? Heart disease Father 16  ?     "code blue"  ? Alcohol abuse Father   ? Diabetes Father   ? Nevi Mother   ? Cancer Maternal Aunt   ?     skin  ? Breast cancer  Maternal Aunt   ? Heart disease Maternal Aunt   ?     CHF  ? Breast cancer Maternal Aunt   ? Cancer Maternal Aunt   ?     skin  ? Diabetes Other   ? Heart disease Other   ? ? ?Social History:  ?Social History  ? ?Socioeconomic History  ? Marital status: Married  ?  Spouse name: Shaun  ? Number of children: 3  ? Years of education: 37  ? Highest education level: Not on file  ?Occupational History  ? Occupation: massage therapy  ?  Comment: by schooling  ? Occupation: Development worker, [employer]  ?  Comment: self  ?Tobacco Use  ? Smoking status: Former  ?  Types: E-cigarettes  ?  Quit date: 11/01/2011  ?  Years since quitting: 10.3  ? Smokeless tobacco: Never  ? Tobacco comments:  ?  02-06-2017 PER PT [facility].  ?Vaping Use  ? Vaping Use: Every day  ?Substance and Sexual Activity  ? Alcohol use: No  ? Drug use: No  ? Sexual activity: Yes  ?  Birth control/protection: Surgical   ?  Comment: hysterectomy  ?Other Topics Concern  ? Not on file  ?Social History Narrative  ? Lives at home  ? Husband Shaun  ? Three daughters  ? Tries to exercise  ? ?Social Determinants of Health  ? ?Financial Resource Strain: Not on file  ?Food Insecurity: No Food Insecurity  ? Worried About Charity fundraiser in the Last Year: Never true  ? Ran Out of Food in the Last Year: Never true  ?Transportation Needs: No Transportation Needs  ? Lack of Transportation (Medical): No  ? Lack of Transportation (Non-Medical): No  ?Physical Activity: Insufficiently Active  ? Days of Exercise per Week: 2 days  ? Minutes of Exercise per Session: 40 min  ?Stress: No Stress Concern Present  ? Feeling of Stress : Not at all  ?Social Connections: Socially Integrated  ? Frequency of Communication with Friends and Family: More than three times a week  ? Frequency of Social Gatherings with Friends and Family: Once a week  ? Attends Religious Services: More than 4 times per year  ? Active Member of Clubs or Organizations: Yes  ? Attends Archivist Meetings: More than 4 times per year  ? Marital Status: Married  ? ? ?Allergies:  ?Allergies  ?Allergen Reactions  ? Ciprofloxacin Hcl Other (See Comments)  ?  Silent seizures.  ? Amoxicillin   ?  dizziness  ? Clindamycin/Lincomycin Nausea And Vomiting  ?  Intercrainial hypertention  ? Doxycycline   ?  Headache, Dizziness, swelling of eyelids  ? Minocycline Swelling  ? Coppertone Kids Spf15 [Albolene] Hives and Swelling  ? Vicodin [Hydrocodone-Acetaminophen] Other (See Comments)  ?  Makes her feel drunk and she prefers not to take this.  ? ? ?Metabolic Disorder Labs: ?No results found for: HGBA1C, MPG ?No results found for: PROLACTIN ?Lab Results  ?Component Value Date  ? CHOL 148 12/26/2016  ? TRIG 30 12/26/2016  ? HDL 50 (L) 12/26/2016  ? CHOLHDL 3.0 12/26/2016  ? VLDL 6 12/26/2016  ? [place] 92 12/26/2016  ? ?Lab Results  ?Component Value Date  ? TSH 1.320 07/02/2021   ? ? ?Therapeutic Level Labs: ?No results found for: LITHIUM ?No results found for: VALPROATE ?No components found for:  CBMZ ? ?Current Medications: ?Current Outpatient Medications  ?Medication Sig Dispense Refill  ? amphetamine-dextroamphetamine (ADDERALL XR) 30 MG 24  hr capsule Take 1 capsule (30 mg total) by mouth every morning. 30 capsule 0  ? lisdexamfetamine (VYVANSE) 10 MG capsule Take 1 capsule (10 mg total) by mouth daily. Total of 40 mg daily. Take along with 30 mg cap 30 capsule 0  ? [START ON 04/04/2022] lisdexamfetamine (VYVANSE) 40 MG capsule Take 1 capsule (40 mg total) by mouth every morning. 30 capsule 0  ? VYVANSE 30 MG capsule Take 1 capsule (30 mg total) by mouth daily. 30 capsule 0  ? ?No current facility-administered medications for this visit.  ? ? ? ?Musculoskeletal: ?Strength & Muscle Tone:  normal ?Gait & Station: normal ?Patient leans: N/A ? ?Psychiatric Specialty Exam: ?Review of Systems  ?Psychiatric/Behavioral: Negative.    ?All other systems reviewed and are negative.  ?Blood pressure 132/90, pulse 92, temperature 98.8 ?F (37.1 ?C), temperature source Temporal, weight 167 lb 6.4 oz (75.9 kg), last menstrual period 08/31/2016.Body mass index is 29.65 kg/m?.  ?General Appearance: Fairly Groomed  ?Eye Contact:  Good  ?Speech:  Clear and Coherent  ?Volume:  Normal  ?Mood:   good  ?Affect:  Appropriate, Congruent, and Full Range  ?Thought Process:  Coherent  ?Orientation:  Full (Time, Place, and Person)  ?Thought Content: Logical   ?Suicidal Thoughts:  No  ?Homicidal Thoughts:  No  ?Memory:  Immediate;   Good  ?Judgement:  Good  ?Insight:  Good  ?Psychomotor Activity:  Normal  ?Concentration:  Concentration: Good and Attention Span: Good  ?Recall:  Good  ?Fund of Knowledge: Good  ?Language: Good  ?Akathisia:  No  ?Handed:  Right  ?AIMS (if indicated): not done  ?Assets:  Communication Skills ?Desire for Improvement  ?ADL's:  Intact  ?Cognition: WNL  ?Sleep:  Good  ? ?Screenings: ?GAD-7    ? ?[facility] Office Visit from 06/29/2021 in [facility]  ?Total GAD-7 Score 3  ? ?  ? ?PHQ2-9   ? ?[place] Office Visit from 03/07/2022 in [facility] Office Visit from 8/30

## 2022-03-07 ENCOUNTER — Ambulatory Visit (INDEPENDENT_AMBULATORY_CARE_PROVIDER_SITE_OTHER): Payer: 59 | Admitting: Psychiatry

## 2022-03-07 ENCOUNTER — Encounter: Payer: Self-pay | Admitting: Psychiatry

## 2022-03-07 VITALS — BP 132/90 | HR 92 | Temp 98.8°F | Wt 167.4 lb

## 2022-03-07 DIAGNOSIS — F900 Attention-deficit hyperactivity disorder, predominantly inattentive type: Secondary | ICD-10-CM | POA: Diagnosis not present

## 2022-03-07 MED ORDER — LISDEXAMFETAMINE DIMESYLATE 40 MG PO CAPS: 40.0000 mg | ORAL_CAPSULE | ORAL | 0 refills | Status: DC

## 2022-03-07 MED ORDER — LISDEXAMFETAMINE DIMESYLATE 10 MG PO CAPS: 10.0000 mg | ORAL_CAPSULE | Freq: Every day | ORAL | 0 refills | Status: DC

## 2022-03-07 NOTE — Patient Instructions (Signed)
Increase Vyvanse 40 mg daily  ?Next appointment: 6/21 at 1:20, video ?

## 2022-03-23 ENCOUNTER — Telehealth: Payer: Self-pay

## 2022-03-23 NOTE — Telephone Encounter (Signed)
Left voice message to contact the office. Could you make sure if she is not taking any other medication to make her feel drowsy. If not, please ask if she is interested in trying Concerta instead of Vyvanse. Thanks.

## 2022-03-23 NOTE — Telephone Encounter (Signed)
pt wanted to speak with dr. Vanetta Shawl about the vyvanse she states it is making her sleepy all the time and it not working. wanted to talk about what else to try.

## 2022-03-24 NOTE — Telephone Encounter (Signed)
pt states she just wants to go back on the adderall she doen't want to try anything new. and she states she not taking anything else to make her feel that way.

## 2022-03-24 NOTE — Telephone Encounter (Signed)
I believe they do not have Adderall XR the last time I spoke with the pharmacy. Could you advise the patient to find a pharmacy where they have that medication so that I can send an order? Thanks.

## 2022-04-04 NOTE — Telephone Encounter (Signed)
Message was left for patient to call back with pharmacy

## 2022-04-17 NOTE — Progress Notes (Unsigned)
Virtual Visit via Video Note  I connected with Alexis White on 04/20/22 at  1:20 PM EDT by a video enabled telemedicine application and verified that I am speaking with the correct person using two identifiers.  Location: Patient: home Provider: office Persons participated in the visit- patient, provider    I discussed the limitations of evaluation and management by telemedicine and the availability of in person appointments. The patient expressed understanding and agreed to proceed.   I discussed the assessment and treatment plan with the patient. The patient was provided an opportunity to ask questions and all were answered. The patient agreed with the plan and demonstrated an understanding of the instructions.   The patient was advised to call back or seek an in-person evaluation if the symptoms worsen or if the condition fails to improve as anticipated.  I provided 8 minutes of non-face-to-face time during this encounter.   [name], MD    [facility] MD/PA/NP OP Progress Note  04/20/2022 1:36 PM Alexis White  MRN:  [id number]  Chief Complaint:  Chief Complaint  Patient presents with   Follow-up   ADHD   HPI:  This is a follow-up appointment for ADHD.  She states that she had worsening in fatigue on the higher dose of Vyvanse.  She was able to get Adderall XR from old prescription, and she has been doing much better since then.  Her energy and ability is back.  She reports good relationship with her family.  She enjoyed camping trip on Father's Day.  She feels good that school and sports are over, although she is also looking forward to the next semester as all of her children are in the same school system.  She sleeps well.  She denies feeling depressed or anxiety.  She has no change in weight; she is hoping to work on diet.  She feels good to stay on the current medication regimen.    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 Reports good childhood  Visit Diagnosis:    ICD-10-CM   1. Attention deficit hyperactivity disorder (ADHD), predominantly inattentive type  F90.0       Past Psychiatric History: Please see initial evaluation for full details. I have reviewed the history. No updates at this time.     Past Medical History:  Past Medical History:  Diagnosis Date   Absence seizure (HCC)    Acute kidney failure following labor and delivery    Acute liver failure    Allergy    Anemia    Asthma    Complication of anesthesia    Depression    HELLP syndrome in first trimester 2009   HELLP syndrome in first trimester    Kidney infection    PONV (postoperative nausea and vomiting)    Seizures (HCC)     Past Surgical History:  Procedure Laterality Date   ABDOMINAL HYSTERECTOMY N/A 08/31/2016   Procedure: ABDOMINAL HYSTERECTOMY WITH REMOVAL OF CERVIX;  Surgeon: Lazaro Arms, MD;  Location: [facility];  Service: Gynecology;  Laterality: N/A;   BREAST ENHANCEMENT SURGERY  CESAREAN SECTION     SCAR REVISION  08/31/2016   Procedure: REVISION OF ABDOMINAL WALL SCAR;  Surgeon: Florian Buff, MD;  Location: [facility];  Service: Gynecology;;   TUBAL LIGATION      Family Psychiatric History: Please see initial evaluation for full details. I have reviewed the history. No updates at this time.     Family History:  Family History  Adopted: Yes  Problem Relation Age of Onset   Cancer Paternal Grandfather        bladder   Heart disease Paternal Grandmother    Heart disease Maternal Grandmother    Congestive Heart Failure Maternal Grandmother    Heart disease Father 4       "code blue"   Alcohol  abuse Father    Diabetes Father    [place] Mother    Cancer Maternal Aunt        skin   Breast cancer Maternal Aunt    Heart disease Maternal Aunt        CHF   Breast cancer Maternal Aunt    Cancer Maternal Aunt        skin   Diabetes Other    Heart disease Other     Social History:  Social History   Socioeconomic History   Marital status: Married    Spouse name: Shaun   Number of children: 3   Years of education: 13   Highest education level: Not on file  Occupational History   Occupation: massage therapy    Comment: by schooling   Occupation: Bake art- cakes    Comment: self  Tobacco Use   Smoking status: Former    Types: E-cigarettes    Quit date: 11/01/2011    Years since quitting: 10.4   Smokeless tobacco: Never   Tobacco comments:    02-06-2017 PER PT [facility].  Vaping Use   Vaping Use: Every day  Substance and Sexual Activity   Alcohol use: No   Drug use: No   Sexual activity: Yes    Birth control/protection: Surgical    Comment: hysterectomy  Other Topics Concern   Not on file  Social History Narrative   Lives at home   Husband Shaun   Three daughters   Tries to exercise   Social Determinants of Health   Financial Resource Strain: Not on file  Food Insecurity: No Food Insecurity (06/29/2021)   Hunger Vital Sign    Worried About Running Out of Food in the Last Year: Never true    Ran Out of Food in the Last Year: Never true  Transportation Needs: No Transportation Needs (06/29/2021)   PRAPARE - Hydrologist (Medical): No    Lack of Transportation (Non-Medical): No  Physical Activity: Insufficiently Active (06/29/2021)   Exercise Vital Sign    Days of Exercise per Week: 2 days    Minutes of Exercise per Session: 40 min  Stress: No Stress Concern Present (06/29/2021)   [place]    Feeling of Stress : Not at all  Social Connections: [place]  (06/29/2021)   Social Connection and Isolation Panel [NHANES]    Frequency of Communication with Friends and Family: More than three times a week    Frequency of Social Gatherings with Friends and Family: Once a week    Attends Religious Services: More than 4 times per year    Active Member of [employer] or Organizations: Yes  Attends Archivist Meetings: More than 4 times per year    Marital Status: Married    Allergies:  Allergies  Allergen Reactions   Ciprofloxacin Hcl Other (See Comments)    Silent seizures.   Amoxicillin     dizziness   Clindamycin/Lincomycin Nausea And Vomiting    Intercrainial hypertention   Doxycycline     Headache, Dizziness, swelling of eyelids   Minocycline Swelling   Coppertone Kids Spf15 [Albolene] Hives and Swelling   Vicodin [Hydrocodone-Acetaminophen] Other (See Comments)    Makes her feel drunk and she prefers not to take this.    Metabolic Disorder Labs: No results found for: "HGBA1C", "MPG" No results found for: "PROLACTIN" Lab Results  Component Value Date   CHOL 148 12/26/2016   TRIG 30 12/26/2016   HDL 50 (L) 12/26/2016   CHOLHDL 3.0 12/26/2016   VLDL 6 12/26/2016   LDLCALC 92 12/26/2016   Lab Results  Component Value Date   TSH 1.320 07/02/2021    Therapeutic Level Labs: No results found for: "LITHIUM" No results found for: "VALPROATE" No results found for: "CBMZ"  Current Medications: Current Outpatient Medications  Medication Sig Dispense Refill   amphetamine-dextroamphetamine (ADDERALL XR) 30 MG 24 hr capsule Take 1 capsule (30 mg total) by mouth every morning. 30 capsule 0   lisdexamfetamine (VYVANSE) 40 MG capsule Take 1 capsule (40 mg total) by mouth every morning. 30 capsule 0   VYVANSE 30 MG capsule Take 1 capsule (30 mg total) by mouth daily. 30 capsule 0   No current facility-administered medications for this visit.     Musculoskeletal: Strength & Muscle Tone:  N/A Gait & Station:  N/A Patient  [place]: N/A  Psychiatric Specialty Exam: Review of Systems  Psychiatric/Behavioral:  Negative for agitation, behavioral problems, confusion, decreased concentration, dysphoric mood, hallucinations, self-injury, sleep disturbance and suicidal ideas. The patient is not nervous/anxious and is not hyperactive.   All other systems reviewed and are negative.   Last menstrual period 08/31/2016.There is no height or weight on file to calculate BMI.  General Appearance: Fairly Groomed  Eye Contact:  Good  Speech:  Clear and Coherent  Volume:  Normal  Mood:   good  Affect:  Appropriate, Congruent, and Full Range  Thought Process:  Coherent  Orientation:  Full (Time, Place, and Person)  Thought Content: Logical   Suicidal Thoughts:  No  Homicidal Thoughts:  No  Memory:  Immediate;   Good  Judgement:  Good  Insight:  Good  Psychomotor Activity:  Normal  Concentration:  Concentration: Good and Attention Span: Good  Recall:  Good  Fund of Knowledge: Good  Language: Good  Akathisia:  No  Handed:  Right  AIMS (if indicated): not done  Assets:  Communication Skills Desire for Improvement  ADL's:  Intact  Cognition: WNL  Sleep:  Good   Screenings: GAD-7    Flowsheet Row Office Visit from 06/29/2021 in [facility]  Total GAD-7 Score 3      PHQ2-9    [place] Office Visit from 03/07/2022 in [facility] Office Visit from 06/29/2021 in [facility] Video Visit from 03/05/2021 in [facility]  PHQ-2 Total Score 0 0 0  PHQ-9 Total Score -- 4 --      [place] Office Visit from 03/07/2022 in [facility] No Risk        Assessment and Plan:  Alexis White is  a 36 y.o. year old female with a history of ADHD, who presents for follow up appointment for below.   1. Attention deficit hyperactivity disorder (ADHD), predominantly inattentive type She reports  significant improvement in fatigue since switching back from Vyvanse to Adderall.  Will continue current dose of Adderall XR to target ADHD.    Plan  Start Adderall XR 30 mg daily  Discontinue Vyvanse Next appointment: 9/13 at 11:30, video  Past trials: Vyvanse (fatigue)   The patient demonstrates the following risk factors for suicide: Chronic risk factors for suicide include: N/A. Acute risk factors for suicide include: N/A. Protective factors for this patient include: positive social support, responsibility to others (children, family), coping skills and hope for the future. Considering these factors, the overall suicide risk at this point appears to be low. Patient is appropriate for outpatient follow up.    I have utilized the [place] Controlled Substances Reporting System (PMP AWARxE) to confirm adherence regarding the patient's medication. My review reveals appropriate prescription fills.     Collaboration of Care: Collaboration of Care: Other N/A  Patient/Guardian was advised Release of Information must be obtained prior to any record release in order to collaborate their care with an outside provider. Patient/Guardian was advised if they have not already done so to contact the registration department to sign all necessary forms in order for [place] to release information regarding their care.   Consent: Patient/Guardian gives verbal consent for treatment and assignment of benefits for services provided during this visit. Patient/Guardian expressed understanding and agreed to proceed.    [name], MD 04/20/2022, 1:36 PM

## 2022-04-20 ENCOUNTER — Encounter: Payer: Self-pay | Admitting: Psychiatry

## 2022-04-20 ENCOUNTER — Telehealth (INDEPENDENT_AMBULATORY_CARE_PROVIDER_SITE_OTHER): Payer: 59 | Admitting: Psychiatry

## 2022-04-20 DIAGNOSIS — F900 Attention-deficit hyperactivity disorder, predominantly inattentive type: Secondary | ICD-10-CM

## 2022-05-30 ENCOUNTER — Other Ambulatory Visit: Payer: Self-pay | Admitting: Psychiatry

## 2022-05-30 ENCOUNTER — Telehealth: Payer: Self-pay

## 2022-05-30 MED ORDER — AMPHETAMINE-DEXTROAMPHET ER 30 MG PO CP24: 30.0000 mg | ORAL_CAPSULE | ORAL | 0 refills | Status: DC

## 2022-05-30 NOTE — Telephone Encounter (Signed)
Ordered.   I have utilized the [place] Controlled Substances Reporting System (PMP AWARxE) to confirm adherence regarding the patient's medication. My review reveals appropriate prescription fills.

## 2022-05-30 NOTE — Telephone Encounter (Signed)
pt called left messages that she needs a refill on the adderall she states that she is out and needs it sent to [facility] on [address]  [place]  pt last seen on  04-20-22 next appt 07-13-22

## 2022-06-02 IMAGING — CT CT RENAL STONE PROTOCOL
2 of 4 series · 16 of 46 positions shown, 18 images · non-contrast
Comparison: No priors.

CLINICAL DATA: 35-year-old female with history of right-sided flank
pain for 1 week. Microscopic hematuria.



[Series 2: axial st · axial · 0.95mm/px · z∈[+900,+1275]mm · 13 of 87 slices shown, 15 images]
[im 6/87  soft-tissue]
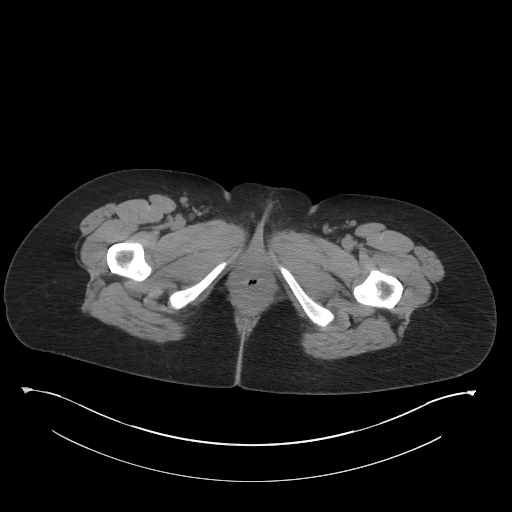
[im 6/87  bone]
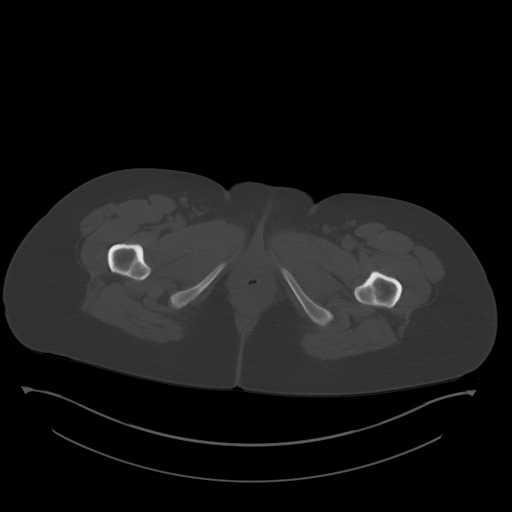
[im 11/87  soft-tissue]
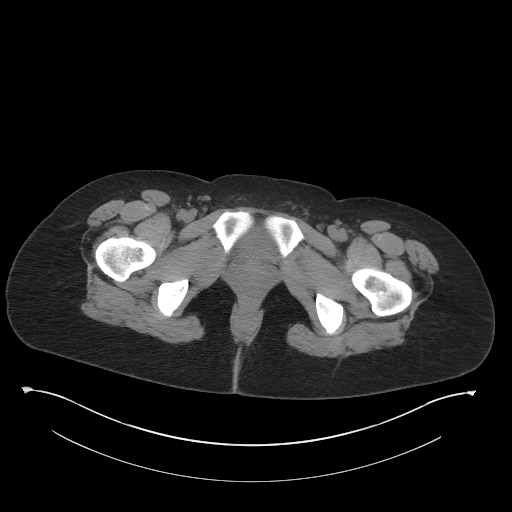
[im 21/87  soft-tissue]
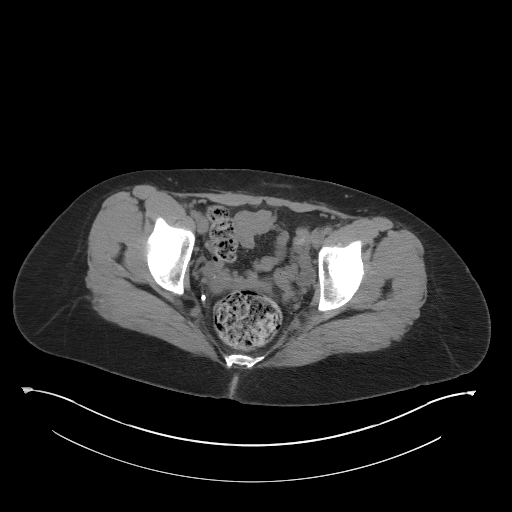
[im 26/87  soft-tissue]
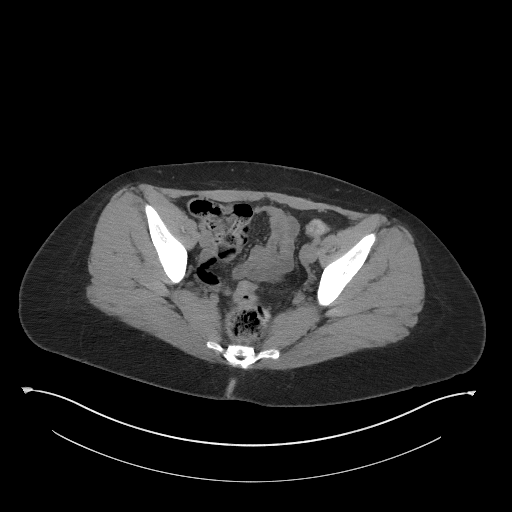
[im 31/87  soft-tissue]
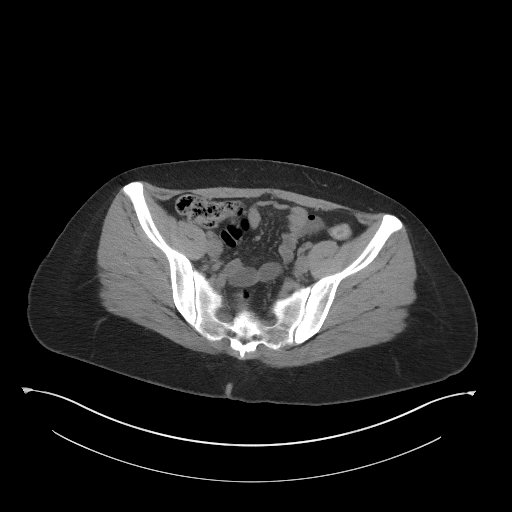
[im 36/87  soft-tissue]
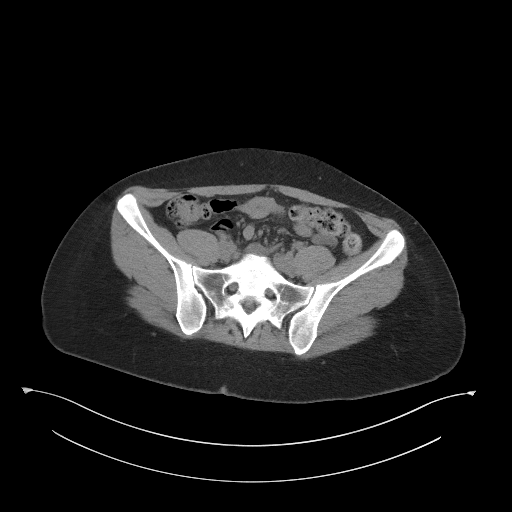
[im 46/87  soft-tissue]
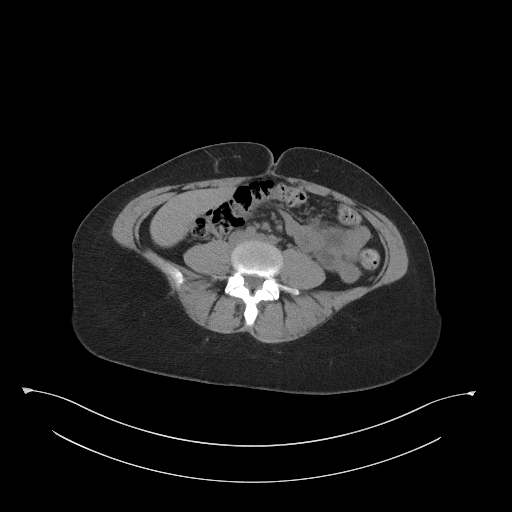
[im 51/87  soft-tissue]
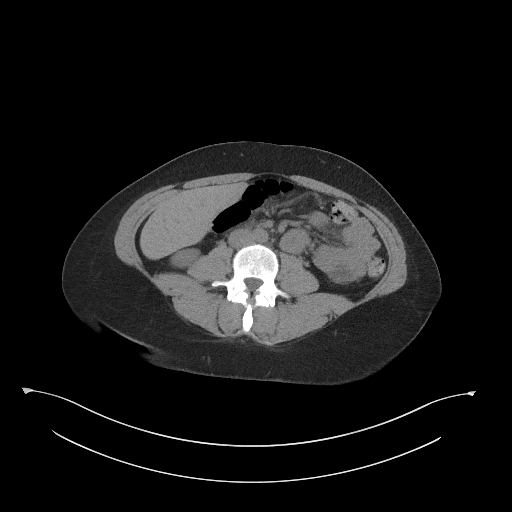
[im 56/87  soft-tissue]
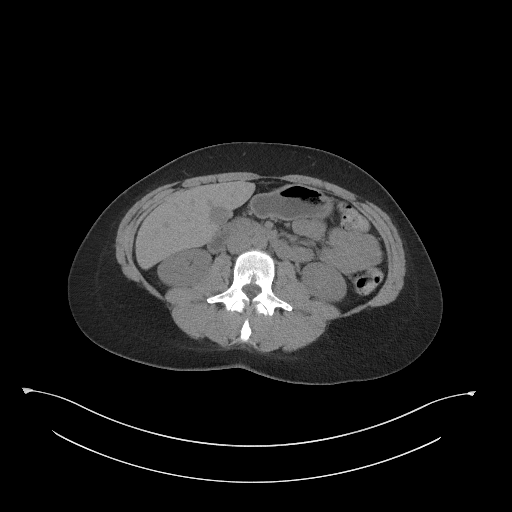
[im 56/87  bone]
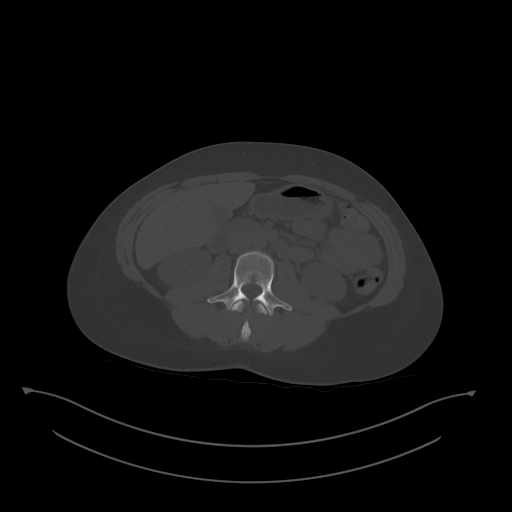
[im 61/87  soft-tissue]
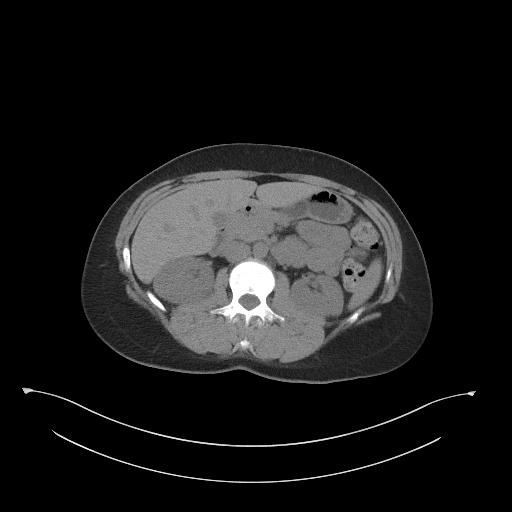
[im 66/87  soft-tissue]
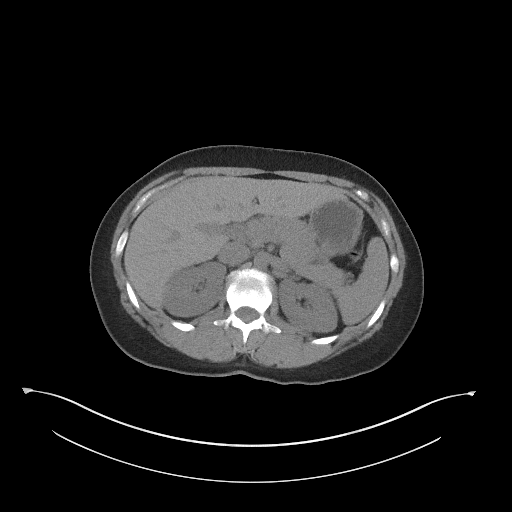
[im 76/87  soft-tissue]
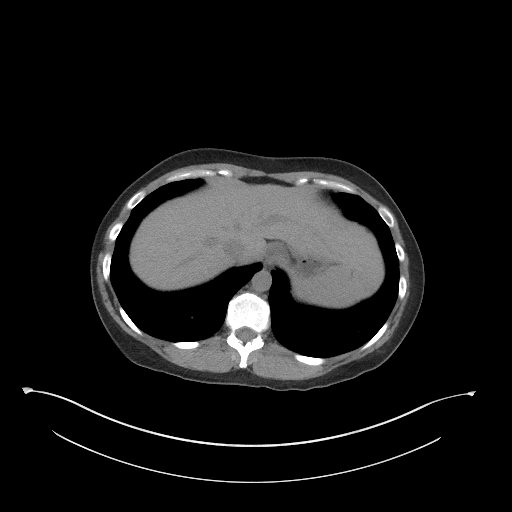
[im 81/87  soft-tissue]
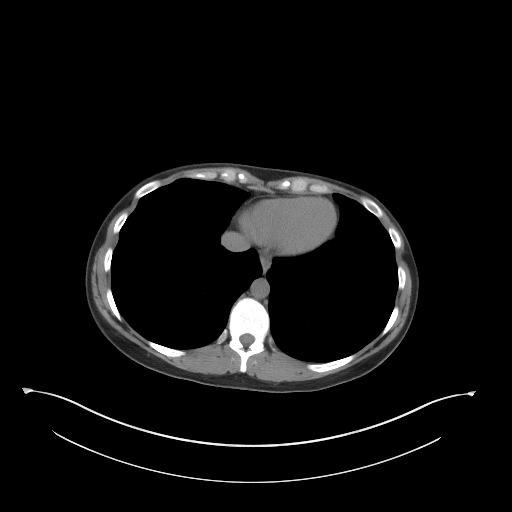

[Series 5: coronal st · coronal · 0.79mm/px · 3 of 100 slices shown]
[im 34/100  soft-tissue]
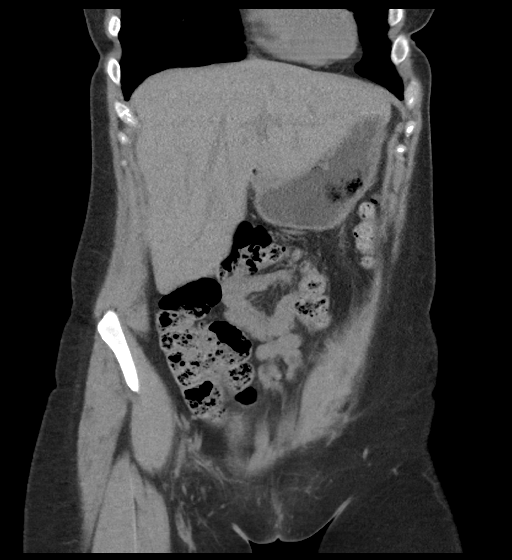
[im 45/100  soft-tissue]
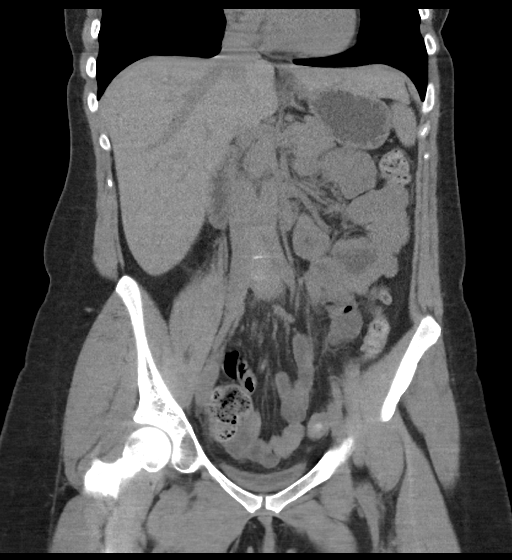
[im 56/100  soft-tissue]
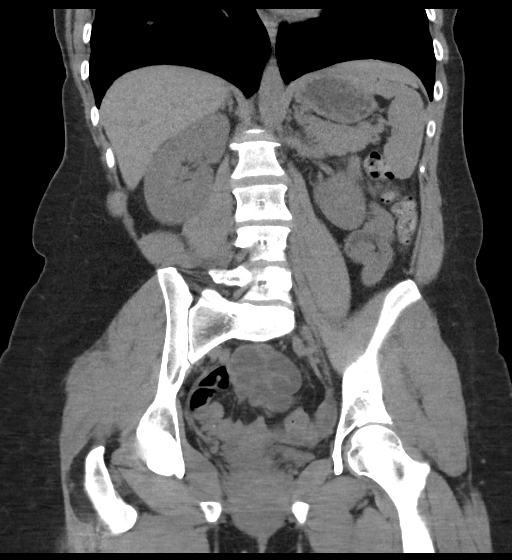

[16 of 46 positions shown; findings below may reference images not displayed]

FINDINGS: Lower chest: Bilateral breast implants are incidentally noted.

Hepatobiliary: No definite suspicious cystic or solid hepatic
lesions are confidently identified on today's noncontrast CT
examination. Unenhanced appearance of the gallbladder is normal.

Pancreas: No definite pancreatic mass or peripancreatic fluid
collections or inflammatory changes are noted on today's noncontrast
CT examination.

Spleen: Unremarkable.

Adrenals/Urinary Tract: There are no abnormal calcifications within
the collecting system of either kidney, along the course of either
ureter, or within the lumen of the urinary bladder. No
hydroureteronephrosis or perinephric stranding to suggest urinary
tract obstruction at this time. The unenhanced appearance of the
kidneys is unremarkable bilaterally. Urinary bladder is nearly
completely decompressed, but otherwise unremarkable in appearance.
Bilateral adrenal glands are normal in appearance.

Stomach/Bowel: Unenhanced appearance of the stomach is normal. There
is no pathologic dilatation of small bowel or colon. Normal
appendix.

Vascular/Lymphatic: Minimal atherosclerotic calcifications are noted
in the pelvic vasculature. No lymphadenopathy noted in the abdomen
or pelvis.

Reproductive: Status post hysterectomy. Ovaries are unremarkable in
appearance.

Other: No significant volume of ascites.  No pneumoperitoneum.

Musculoskeletal: There are no aggressive appearing lytic or blastic
lesions noted in the visualized portions of the skeleton.
IMPRESSION: 1. No acute findings are noted in the abdomen or pelvis to account
for the patient's symptoms. Specifically, no urinary tract calculi
no findings of urinary tract obstruction.
2. Mild atherosclerosis.

## 2022-07-04 IMAGING — DX DG CHEST 2V
2 series · 2 of 2 positions shown · non-contrast
Comparison: None.

CLINICAL DATA: Persistent cough over the last month

EXAM:
CHEST - 2 VIEW

[chest pa]
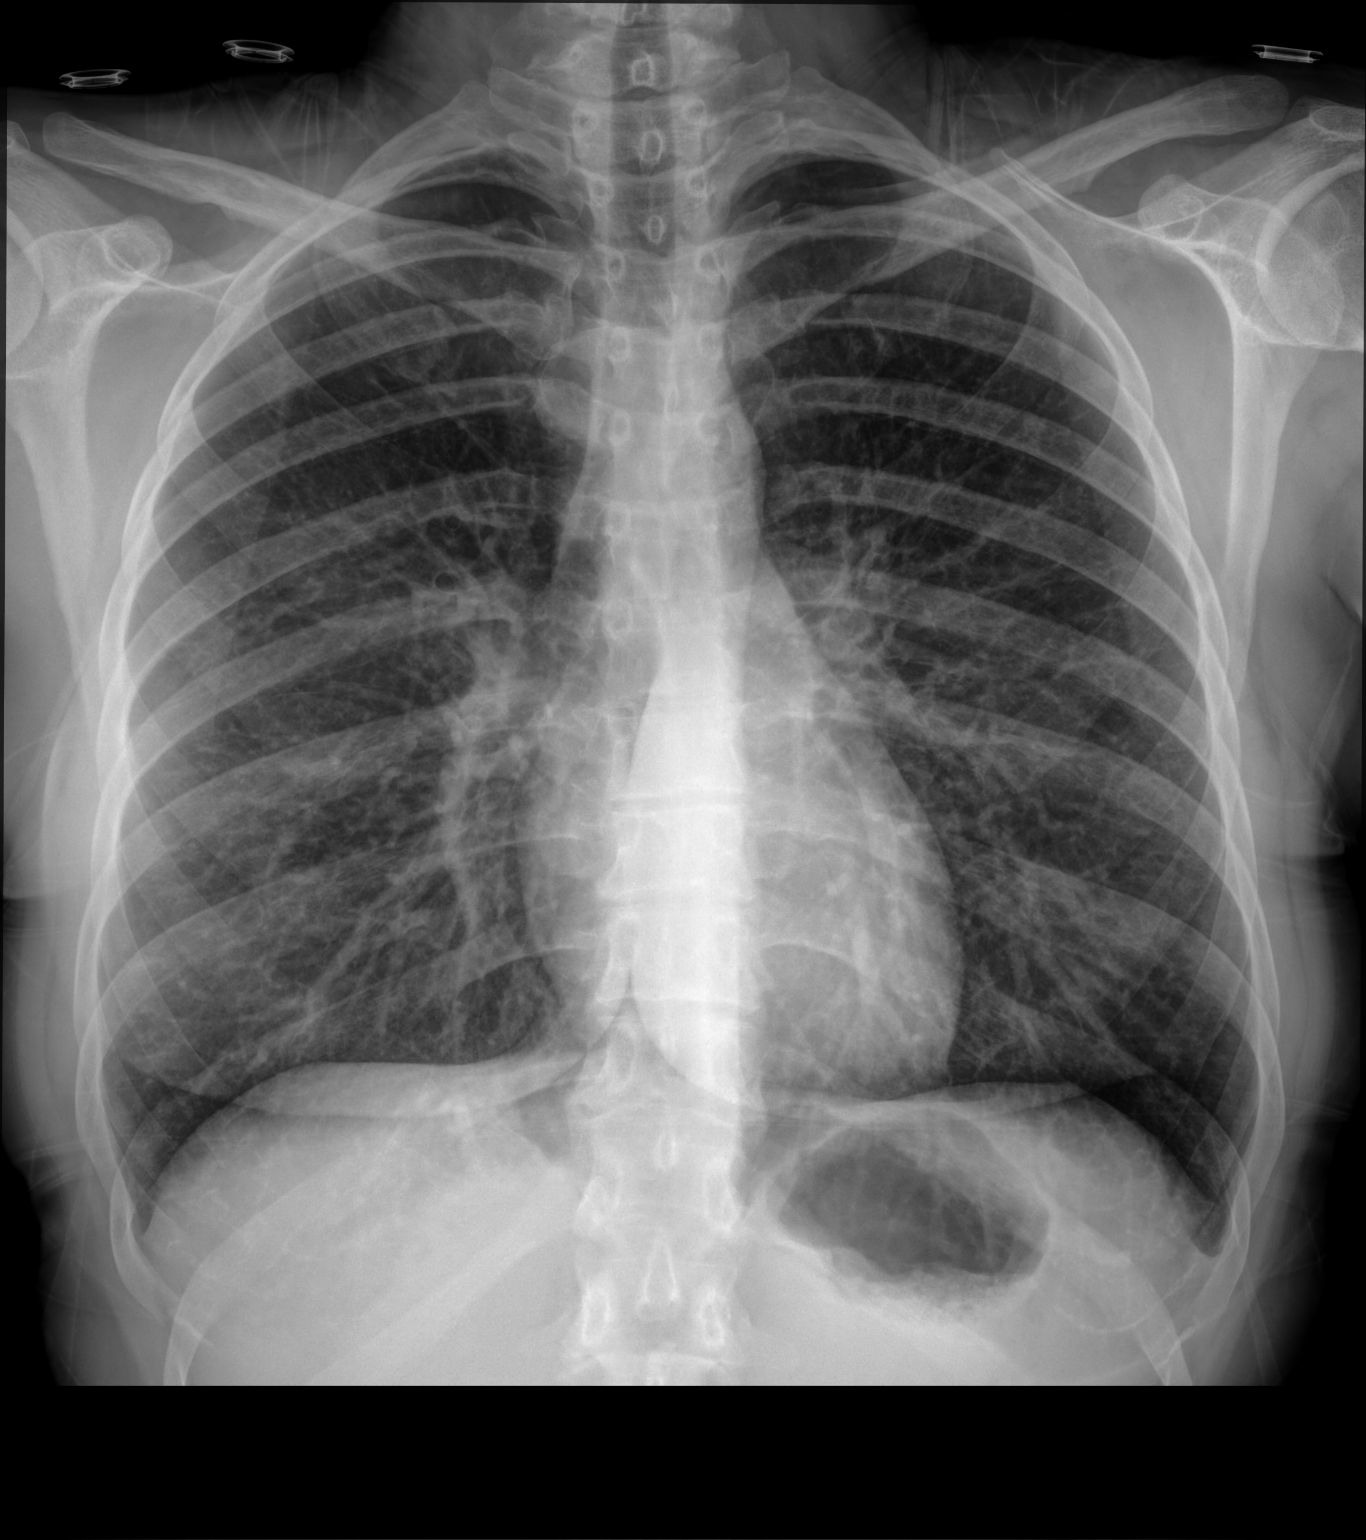

[chest lat]
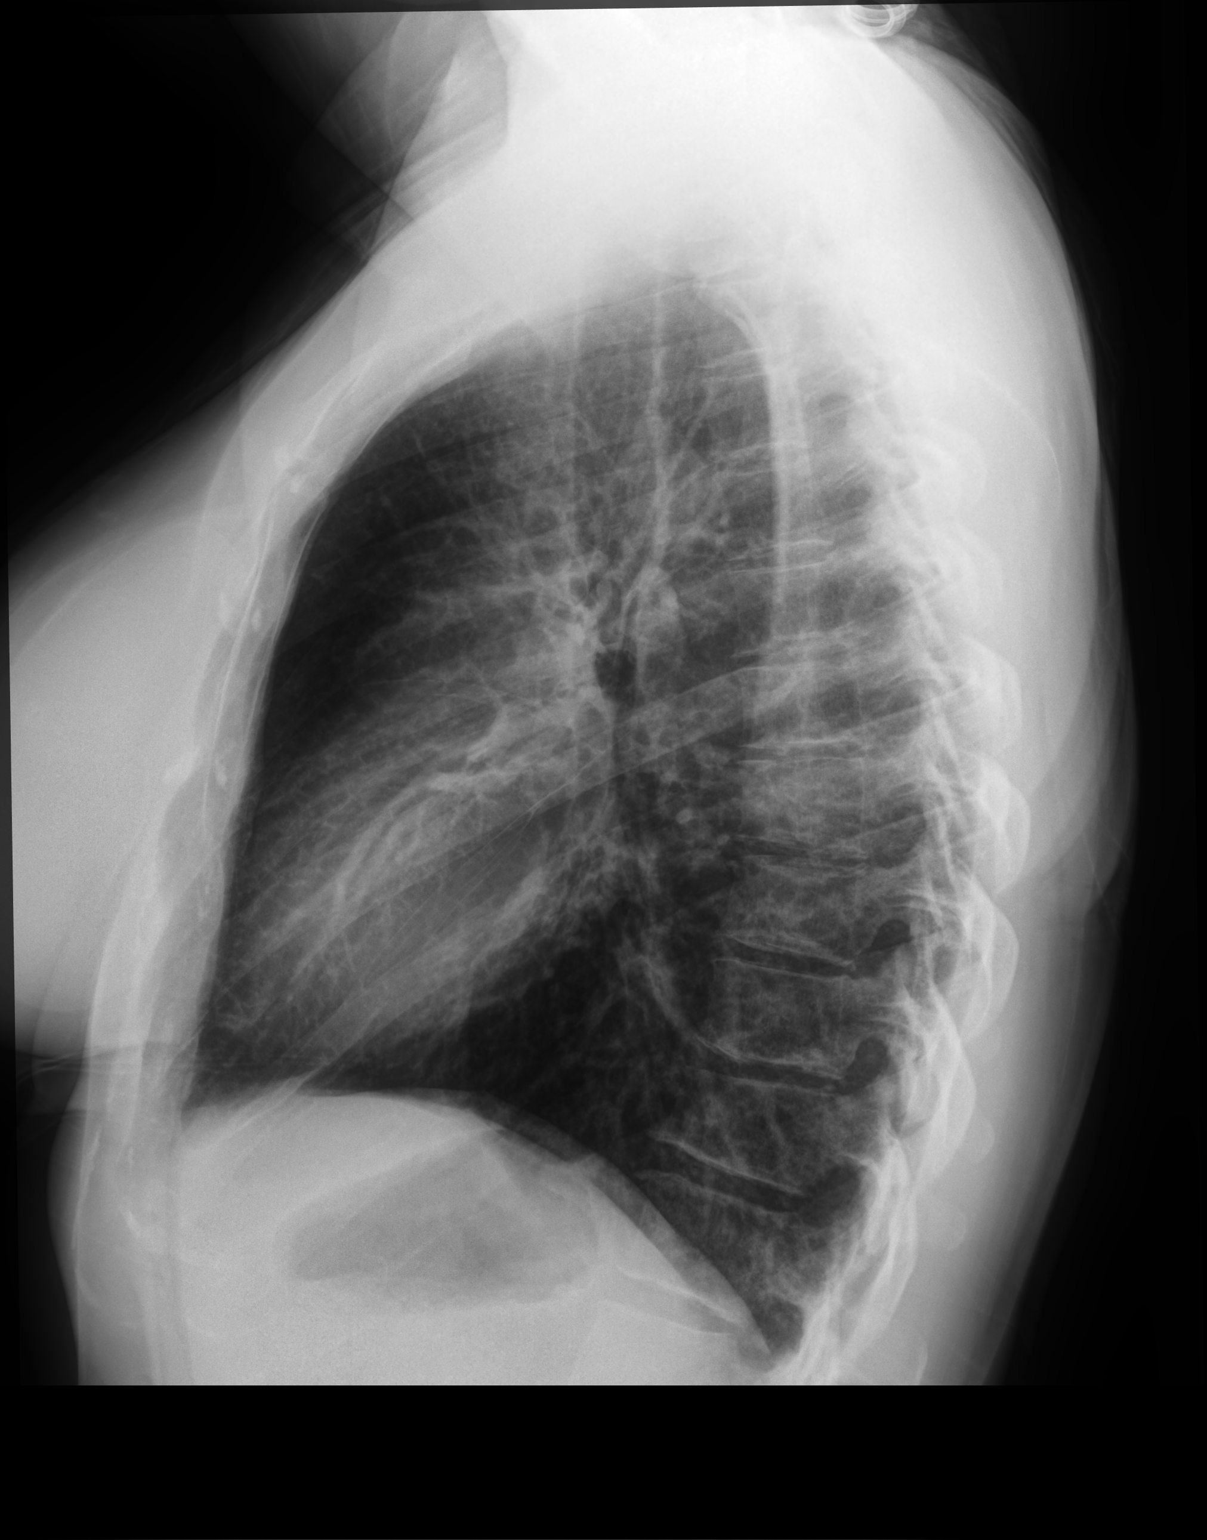

[2 of 2 positions shown; findings below may reference images not displayed]

FINDINGS: Heart and mediastinal shadows are normal. There may be mild central
bronchial thickening but there is no infiltrate, collapse or
effusion. No abnormal bone finding.
IMPRESSION: Possible bronchitis.  No infiltrate or collapse.

## 2022-07-11 NOTE — Progress Notes (Unsigned)
Virtual Visit via Video Note  I connected with Alexis White on 07/13/22 at 11:30 AM EDT by a video enabled telemedicine application and verified that I am speaking with the correct person using two identifiers.  Location: Patient: home Provider: office Persons participated in the visit- patient, provider    I discussed the limitations of evaluation and management by telemedicine and the availability of in person appointments. The patient expressed understanding and agreed to proceed.    I discussed the assessment and treatment plan with the patient. The patient was provided an opportunity to ask questions and all were answered. The patient agreed with the plan and demonstrated an understanding of the instructions.   The patient was advised to call back or seek an in-person evaluation if the symptoms worsen or if the condition fails to improve as anticipated.  I provided 20 minutes of non-face-to-face time during this encounter.   [name], MD    [facility] MD/PA/NP OP Progress Note  07/13/2022 12:11 PM Alexis White  MRN:  [id number]  Chief Complaint:  Chief Complaint  Patient presents with   Follow-up   ADHD   HPI:  This is a follow-up appointment for ADHD.  She states that she has been doing well.  Her children are doing well in the middle school.  She feels less fatigue since switching back from Vyvanse to Adderall XR.  Although she drinks up to 2 cups of coffee, she does it more of a habit, and denies any issues with completing tasks or concentration.  Although she occasionally feels irritable, she is able to handle things well.  She has not been able to lose weight despite eating healthy.  She thinks she might have perimenopause at earlier age, referring to her history of hysterectomy.  She sleeps well.  She denies feeling depressed or anxiety.  She denies SI.  She does not drink alcohol as she does not like the way that she is not in control.  She denies drug use.  She feels  comfortable to stay on the current medication regimen.   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 Reports good childhood  Visit Diagnosis:    ICD-10-CM   1. Attention deficit hyperactivity disorder (ADHD), predominantly inattentive type  F90.0       Past Psychiatric History: Please see initial evaluation for full details. I have reviewed the history. No updates at this time.     Past Medical History:  Past Medical History:  Diagnosis Date   Absence seizure (HCC)    Acute kidney failure following labor and delivery    Acute liver failure    Allergy    Anemia    Asthma    Complication of anesthesia    Depression    HELLP syndrome in first trimester 2009   HELLP syndrome in first trimester    Kidney infection    PONV (postoperative nausea and vomiting)    Seizures (HCC)     Past Surgical History:  Procedure Laterality Date   ABDOMINAL HYSTERECTOMY N/A 08/31/2016   Procedure: ABDOMINAL HYSTERECTOMY WITH REMOVAL OF CERVIX;  Surgeon: Lazaro Arms, MD;  Location: [facility];  Service: Gynecology;  Laterality: N/A;   BREAST ENHANCEMENT SURGERY     CESAREAN SECTION  SCAR REVISION  08/31/2016   Procedure: REVISION OF ABDOMINAL WALL SCAR;  Surgeon: Florian Buff, MD;  Location: [facility];  Service: Gynecology;;   TUBAL LIGATION      Family Psychiatric History: Please see initial evaluation for full details. I have reviewed the history. No updates at this time.     Family History:  Family History  Adopted: Yes  Problem Relation Age of Onset   Cancer Paternal Grandfather        bladder   Heart disease Paternal Grandmother    Heart disease Maternal Grandmother    Congestive Heart Failure Maternal Grandmother    Heart disease Father 21       "code blue"    Alcohol abuse Father    Diabetes Father    Nevi Mother    Cancer Maternal Aunt        skin   Breast cancer Maternal Aunt    Heart disease Maternal Aunt        CHF   Breast cancer Maternal Aunt    Cancer Maternal Aunt        skin   Diabetes Other    Heart disease Other     Social History:  Social History   Socioeconomic History   Marital status: Married    Spouse name: Shaun   Number of children: 3   Years of education: 13   Highest education level: Not on file  Occupational History   Occupation: massage therapy    Comment: by schooling   Occupation: Bake art- cakes    Comment: self  Tobacco Use   Smoking status: Former    Types: E-cigarettes    Quit date: 11/01/2011    Years since quitting: 10.7   Smokeless tobacco: Never   Tobacco comments:    02-06-2017 PER PT [facility].  Vaping Use   Vaping Use: Every day  Substance and Sexual Activity   Alcohol use: No   Drug use: No   Sexual activity: Yes    Birth control/protection: Surgical    Comment: hysterectomy  Other Topics Concern   Not on file  Social History Narrative   Lives at home   Husband Shaun   Three daughters   Tries to exercise   Social Determinants of Health   Financial Resource Strain: Not on file  Food Insecurity: No Food Insecurity (06/29/2021)   Hunger Vital Sign    Worried About Running Out of Food in the Last Year: Never true    Ran Out of Food in the Last Year: Never true  Transportation Needs: No Transportation Needs (06/29/2021)   PRAPARE - Hydrologist (Medical): No    Lack of Transportation (Non-Medical): No  Physical Activity: Insufficiently Active (06/29/2021)   Exercise Vital Sign    Days of Exercise per Week: 2 days    Minutes of Exercise per Session: 40 min  Stress: No Stress Concern Present (06/29/2021)   [place]    Feeling of Stress : Not at all  Social Connections: [place]  (06/29/2021)   Social Connection and Isolation Panel [NHANES]    Frequency of Communication with Friends and Family: More than three times a week    Frequency of Social Gatherings with Friends and Family: Once a week    Attends Religious Services: More than 4 times per year    Active Member of [employer] or Organizations: Yes    Attends Archivist Meetings:  More than 4 times per year    Marital Status: Married    Allergies:  Allergies  Allergen Reactions   Ciprofloxacin Hcl Other (See Comments)    Silent seizures.   Amoxicillin     dizziness   Clindamycin/Lincomycin Nausea And Vomiting    Intercrainial hypertention   Doxycycline     Headache, Dizziness, swelling of eyelids   Minocycline Swelling   Coppertone Kids Spf15 [Albolene] Hives and Swelling   Vicodin [Hydrocodone-Acetaminophen] Other (See Comments)    Makes her feel drunk and she prefers not to take this.    Metabolic Disorder Labs: No results found for: "HGBA1C", "MPG" No results found for: "PROLACTIN" Lab Results  Component Value Date   CHOL 148 12/26/2016   TRIG 30 12/26/2016   HDL 50 (L) 12/26/2016   CHOLHDL 3.0 12/26/2016   VLDL 6 12/26/2016   LDLCALC 92 12/26/2016   Lab Results  Component Value Date   TSH 1.320 07/02/2021    Therapeutic Level Labs: No results found for: "LITHIUM" No results found for: "VALPROATE" No results found for: "CBMZ"  Current Medications: Current Outpatient Medications  Medication Sig Dispense Refill   [START ON 07/30/2022] amphetamine-dextroamphetamine (ADDERALL XR) 30 MG 24 hr capsule Take 1 capsule (30 mg total) by mouth every morning. 30 capsule 0   [START ON 08/31/2022] amphetamine-dextroamphetamine (ADDERALL XR) 30 MG 24 hr capsule Take 1 capsule (30 mg total) by mouth every morning. 30 capsule 0   [START ON 09/30/2022] amphetamine-dextroamphetamine (ADDERALL XR) 30 MG 24 hr capsule Take 1 capsule (30 mg total) by mouth every morning. 30 capsule 0    amphetamine-dextroamphetamine (ADDERALL XR) 30 MG 24 hr capsule Take 1 capsule (30 mg total) by mouth every morning. 30 capsule 0   lisdexamfetamine (VYVANSE) 40 MG capsule Take 1 capsule (40 mg total) by mouth every morning. 30 capsule 0   VYVANSE 30 MG capsule Take 1 capsule (30 mg total) by mouth daily. 30 capsule 0   No current facility-administered medications for this visit.     Musculoskeletal: Strength & Muscle Tone:  N/A Gait & Station:  N/.A Patient leans: N/A  Psychiatric Specialty Exam: Review of Systems  Psychiatric/Behavioral: Negative.    All other systems reviewed and are negative.   Last menstrual period 08/31/2016.There is no height or weight on file to calculate BMI.  General Appearance: Fairly Groomed  Eye Contact:  Good  Speech:  Clear and Coherent  Volume:  Normal  Mood:   good  Affect:  Appropriate, Congruent, and Full Range  Thought Process:  Coherent  Orientation:  Full (Time, Place, and Person)  Thought Content: Logical   Suicidal Thoughts:  No  Homicidal Thoughts:  No  Memory:  Immediate;   Good  Judgement:  Good  Insight:  Good  Psychomotor Activity:  Normal  Concentration:  Concentration: Good and Attention Span: Good  Recall:  Good  Fund of Knowledge: Good  Language: Good  Akathisia:  No  Handed:  Right  AIMS (if indicated): not done  Assets:  Communication Skills Desire for Improvement  ADL's:  Intact  Cognition: WNL  Sleep:  Good   Screenings: GAD-7    Flowsheet Row Office Visit from 06/29/2021 in [facility]  Total GAD-7 Score 3      PHQ2-9    Flowsheet Row Office Visit from 03/07/2022 in [facility] Office Visit from 06/29/2021 in [facility] Video Visit from 03/05/2021 in [facility]  PHQ-2 Total  Score 0 0 0  PHQ-9 Total Score -- 4 --      [place] Office Visit from 03/07/2022 in [place]  No Risk        Assessment and Plan:  DASHONDA PIASCIK is a 36 y.o. year old female with a history of  ADHD, who presents for follow up appointment for below.    1. Attention deficit hyperactivity disorder (ADHD), predominantly inattentive type She reports significant benefit for fatigue and ADHD symptoms since switching back from Vyvanse or Adderall.  Will continue current dose to target ADHD.    Plan  Continue Adderall XR 30 mg daily  Next appointment: 12/6 at 10:30, video   Past trials: Vyvanse (fatigue)   The patient demonstrates the following risk factors for suicide: Chronic risk factors for suicide include: N/A. Acute risk factors for suicide include: N/A. Protective factors for this patient include: positive social support, responsibility to others (children, family), coping skills and hope for the future. Considering these factors, the overall suicide risk at this point appears to be low. Patient is appropriate for outpatient follow up.        Collaboration of Care: Collaboration of Care: Other n/a  Patient/Guardian was advised Release of Information must be obtained prior to any record release in order to collaborate their care with an outside provider. Patient/Guardian was advised if they have not already done so to contact the registration department to sign all necessary forms in order for [place] to release information regarding their care.   Consent: Patient/Guardian gives verbal consent for treatment and assignment of benefits for services provided during this visit. Patient/Guardian expressed understanding and agreed to proceed.    Norman Clay, MD 07/13/2022, 12:11 PM

## 2022-07-13 ENCOUNTER — Encounter: Payer: Self-pay | Admitting: Psychiatry

## 2022-07-13 ENCOUNTER — Telehealth (INDEPENDENT_AMBULATORY_CARE_PROVIDER_SITE_OTHER): Payer: 59 | Admitting: Psychiatry

## 2022-07-13 DIAGNOSIS — F900 Attention-deficit hyperactivity disorder, predominantly inattentive type: Secondary | ICD-10-CM

## 2022-07-13 MED ORDER — AMPHETAMINE-DEXTROAMPHET ER 30 MG PO CP24: 30.0000 mg | ORAL_CAPSULE | ORAL | 0 refills | Status: DC

## 2022-10-03 NOTE — Progress Notes (Unsigned)
Virtual Visit via Video Note  I connected with Alexis White on 10/05/22 at 10:30 AM EST by a video enabled telemedicine application and verified that I am speaking with the correct person using two identifiers.  Location: Patient: home Provider: office Persons participated in the visit- patient, provider    I discussed the limitations of evaluation and management by telemedicine and the availability of in person appointments. The patient expressed understanding and agreed to proceed.    I discussed the assessment and treatment plan with the patient. The patient was provided an opportunity to ask questions and all were answered. The patient agreed with the plan and demonstrated an understanding of the instructions.   The patient was advised to call back or seek an in-person evaluation if the symptoms worsen or if the condition fails to improve as anticipated.  I provided 10 minutes of non-face-to-face time during this encounter.   [name], MD    [facility] MD/PA/NP OP Progress Note  10/05/2022 11:04 AM Alexis White  MRN:  [id number]  Chief Complaint:  Chief Complaint  Patient presents with   Follow-up   HPI:  This is a follow-up appointment for ADHD.  She states that she is having a headache, which she partly attributes to issues with her glasses.  She also feels tired and have hot flashes at times.  She had a hysterectomy, and has not had a menstrual cycle for several years.  She is hoping to be seen by a provider who is providing holistic approach rather than taking medication.  She reports history of coagulation from contraceptive in the past.  She tends to have reaction to medication, and if he feels wary of trying new medication.  She is doing good otherwise.  She is able to stay on tasks and focus well as long as she is on the Adderall.  She does not think her headache is coming from Adderall as she did not have this while she has been on Adderall for a while.  She denies  alcohol use or drug use.  She feels comfortable to stay at the current dose of Adderall.    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 Reports good childhood Visit Diagnosis:    ICD-10-CM   1. Attention deficit hyperactivity disorder (ADHD), predominantly inattentive type  F90.0       Past Psychiatric History: Please see initial evaluation for full details. I have reviewed the history. No updates at this time.     Past Medical History:  Past Medical History:  Diagnosis Date   Absence seizure (HCC)    Acute kidney failure following labor and delivery    Acute liver failure    Allergy    Anemia    Asthma    Complication of anesthesia    Depression    HELLP syndrome in first trimester 2009   HELLP syndrome in first trimester    Kidney infection    PONV (postoperative nausea and vomiting)    Seizures (HCC)     Past Surgical History:  Procedure Laterality Date   ABDOMINAL HYSTERECTOMY N/A 08/31/2016   Procedure: ABDOMINAL HYSTERECTOMY WITH REMOVAL OF CERVIX;  Surgeon: Lazaro Arms, MD;  Location: [facility];  Service: Gynecology;  Laterality: N/A;   BREAST ENHANCEMENT  SURGERY     CESAREAN SECTION     SCAR REVISION  08/31/2016   Procedure: REVISION OF ABDOMINAL WALL SCAR;  Surgeon: [name], MD;  Location: [facility];  Service: Gynecology;;   TUBAL LIGATION      Family Psychiatric History: Please see initial evaluation for full details. I have reviewed the history. No updates at this time.     Family History:  Family History  Adopted: Yes  Problem Relation Age of Onset   Cancer Paternal Grandfather        bladder   Heart disease Paternal Grandmother    Heart disease Maternal Grandmother    Congestive Heart Failure Maternal Grandmother    Heart disease  Father [id number]       "code blue"   Alcohol abuse Father    Diabetes Father    Nevi Mother    Cancer Maternal Aunt        skin   Breast cancer Maternal Aunt    Heart disease Maternal Aunt        CHF   Breast cancer Maternal Aunt    Cancer Maternal Aunt        skin   Diabetes Other    Heart disease Other     Social History:  Social History   Socioeconomic History   Marital status: Married    Spouse name: Alexis White   Number of children: 3   Years of education: 13   Highest education level: Not on file  Occupational History   Occupation: massage therapy    Comment: by schooling   Occupation: Bake art- cakes    Comment: self  Tobacco Use   Smoking status: Former    Types: E-cigarettes    Quit date: 11/01/2011    Years since quitting: 10.9   Smokeless tobacco: Never   Tobacco comments:    02-06-2017 PER PT [facility].  Vaping Use   Vaping Use: Every day  Substance and Sexual Activity   Alcohol use: No   Drug use: No   Sexual activity: Yes    Birth control/protection: Surgical    Comment: hysterectomy  Other Topics Concern   Not on file  Social History Narrative   Lives at home   Husband Alexis White   Three daughters   Tries to exercise   Social Determinants of Health   Financial Resource Strain: Not on file  Food Insecurity: No Food Insecurity (06/29/2021)   Hunger Vital Sign    Worried About Running Out of Food in the Last Year: Never true    Ran Out of Food in the Last Year: Never true  Transportation Needs: No Transportation Needs (06/29/2021)   PRAPARE - Administrator, Civil ServiceTransportation    Lack of Transportation (Medical): No    Lack of Transportation (Non-Medical): No  Physical Activity: Insufficiently Active (06/29/2021)   Exercise Vital Sign    Days of Exercise per Week: 2 days    Minutes of Exercise per Session: 40 min  Stress: No Stress Concern Present (06/29/2021)   [employer] - Occupational Stress Questionnaire    Feeling of Stress : Not at all  Social  Connections: Socially Integrated (06/29/2021)   Social Connection and Isolation Panel [NHANES]    Frequency of Communication with Friends and Family: More than three times a week    Frequency of Social Gatherings with Friends and Family: Once a week    Attends Religious Services: More than 4 times per year    Active Member of [employer]  or Organizations: Yes    Attends Banker Meetings: More than 4 times per year    Marital Status: Married    Allergies:  Allergies  Allergen Reactions   Ciprofloxacin Hcl Other (See Comments)    Silent seizures.   Amoxicillin     dizziness   Clindamycin/Lincomycin Nausea And Vomiting    Intercrainial hypertention   Doxycycline     Headache, Dizziness, swelling of eyelids   Minocycline Swelling   Coppertone Kids Spf15 [Albolene] Hives and Swelling   Vicodin [Hydrocodone-Acetaminophen] Other (See Comments)    Makes her feel drunk and she prefers not to take this.    Metabolic Disorder Labs: No results found for: "HGBA1C", "MPG" No results found for: "PROLACTIN" Lab Results  Component Value Date   CHOL 148 12/26/2016   TRIG 30 12/26/2016   HDL 50 (L) 12/26/2016   CHOLHDL 3.0 12/26/2016   VLDL 6 12/26/2016   LDLCALC 92 12/26/2016   Lab Results  Component Value Date   TSH 1.320 07/02/2021    Therapeutic Level Labs: No results found for: "LITHIUM" No results found for: "VALPROATE" No results found for: "CBMZ"  Current Medications: Current Outpatient Medications  Medication Sig Dispense Refill   [START ON 10/31/2022] ADDERALL XR 30 MG 24 hr capsule Take 1 capsule (30 mg total) by mouth every morning. 30 capsule 0   [START ON 12/01/2022] ADDERALL XR 30 MG 24 hr capsule Take 1 capsule (30 mg total) by mouth every morning. 30 capsule 0   amphetamine-dextroamphetamine (ADDERALL XR) 30 MG 24 hr capsule Take 1 capsule (30 mg total) by mouth every morning. 30 capsule 0   No current facility-administered medications for this visit.      Musculoskeletal: Strength & Muscle Tone:  N/A Gait & Station:  N/A Patient leans: N/A  Psychiatric Specialty Exam: Review of Systems  Psychiatric/Behavioral: Negative.    All other systems reviewed and are negative.   Last menstrual period 08/31/2016.There is no height or weight on file to calculate BMI.  General Appearance: Fairly Groomed  Eye Contact:  Good  Speech:  Clear and Coherent  Volume:  Normal  Mood:   good  Affect:  Appropriate, Congruent, and Full Range  Thought Process:  Coherent  Orientation:  Full (Time, Place, and Person)  Thought Content: Logical   Suicidal Thoughts:  No  Homicidal Thoughts:  No  Memory:  Immediate;   Good  Judgement:  Good  Insight:  Good  Psychomotor Activity:  Normal  Concentration:  Concentration: Good and Attention Span: Good  Recall:  Good  Fund of Knowledge: Good  Language: Good  Akathisia:  No  Handed:  Right  AIMS (if indicated): not done  Assets:  Communication Skills Desire for Improvement  ADL's:  Intact  Cognition: WNL  Sleep:  Good   Screenings: GAD-7    Flowsheet Row Office Visit from 06/29/2021 in [facility]  Total GAD-7 Score 3      PHQ2-9    Flowsheet Row Office Visit from 03/07/2022 in [facility] Office Visit from 06/29/2021 in [facility] Video Visit from 03/05/2021 in [facility]  PHQ-2 Total Score 0 0 0  PHQ-9 Total Score -- 4 --      Flowsheet Row Office Visit from 03/07/2022 in [facility]  C-SSRS RISK CATEGORY No Risk        Assessment and Plan:  AZAELA CARACCI is a 36 y.o. year old female with  a history of ADHD, who presents for follow up appointment for below.   1. Attention deficit hyperactivity disorder (ADHD), predominantly inattentive type She reports good benefit from Adderall.  Will continue current dose to target ADHD symptoms.    Plan  Continue Adderall XR 30 mg daily  (name brand only) Next appointment: 2/28 at 11AM, video   Past trials: Vyvanse (fatigue)   The patient demonstrates the following risk factors for suicide: Chronic risk factors for suicide include: N/A. Acute risk factors for suicide include: N/A. Protective factors for this patient include: positive social support, responsibility to others (children, family), coping skills and hope for the future. Considering these factors, the overall suicide risk at this point appears to be low. Patient is appropriate for outpatient follow up.         I have utilized the [place] Controlled Substances Reporting System (PMP AWARxE) to confirm adherence regarding the patient's medication. My review reveals appropriate prescription fills.       Collaboration of Care: Collaboration of Care: Other reviewed notes in Epic  Patient/Guardian was advised Release of Information must be obtained prior to any record release in order to collaborate their care with an outside provider. Patient/Guardian was advised if they have not already done so to contact the registration department to sign all necessary forms in order for [place] to release information regarding their care.   Consent: Patient/Guardian gives verbal consent for treatment and assignment of benefits for services provided during this visit. Patient/Guardian expressed understanding and agreed to proceed.    [name], MD 10/05/2022, 11:04 AM

## 2022-10-05 ENCOUNTER — Encounter: Payer: Self-pay | Admitting: Psychiatry

## 2022-10-05 ENCOUNTER — Telehealth (INDEPENDENT_AMBULATORY_CARE_PROVIDER_SITE_OTHER): Payer: 59 | Admitting: Psychiatry

## 2022-10-05 DIAGNOSIS — F900 Attention-deficit hyperactivity disorder, predominantly inattentive type: Secondary | ICD-10-CM

## 2022-10-05 MED ORDER — ADDERALL XR 30 MG PO CP24: 30.0000 mg | ORAL_CAPSULE | ORAL | 0 refills | Status: DC

## 2022-10-05 NOTE — Patient Instructions (Signed)
Continue Adderall XR 30 mg daily  Next appointment: 2/28 at at 11AM

## 2022-12-28 ENCOUNTER — Other Ambulatory Visit: Payer: Self-pay | Admitting: Psychiatry

## 2022-12-28 MED ORDER — ADDERALL XR 30 MG PO CP24: 30.0000 mg | ORAL_CAPSULE | ORAL | 0 refills | Status: DC

## 2023-02-05 NOTE — Progress Notes (Unsigned)
Virtual Visit via Video Note  I connected with Alexis White on 02/08/23 at  9:30 AM EDT by a video enabled telemedicine application and verified that I am speaking with the correct person using two identifiers.  Location: Patient: home Provider: office Persons participated in the visit- patient, provider    I discussed the limitations of evaluation and management by telemedicine and the availability of in person appointments. The patient expressed understanding and agreed to proceed.     I discussed the assessment and treatment plan with the patient. The patient was provided an opportunity to ask questions and all were answered. The patient agreed with the plan and demonstrated an understanding of the instructions.   The patient was advised to call back or seek an in-person evaluation if the symptoms worsen or if the condition fails to improve as anticipated.  I provided 20 minutes of non-face-to-face time during this encounter.   Alexis Hotter, MD    [facility] MD/PA/NP OP Progress Note  02/08/2023 10:06 AM IKEA POSTEN  MRN:  [id number]  Chief Complaint:  Chief Complaint  Patient presents with   Follow-up   HPI:  This is a follow-up appointment for ADHD. She states that she has been doing very well.  Her children have started softball.  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  She is working on a project after project at home.  She is planning to see a functional medicine provider in [place].  She feels fatigue , and thinks it is hormonal, although she is not sure if ADHD, is any part in this.  She feels fatigue especially later in the day.  She sleeps well.  She denies feeling depressed or anxiety.  She denies SI.  She denies alcohol use or drug use.  She takes Adderall every day and has good concentration. People would notice if she does not take medication.  She feels comfortable to stay on the current dose at this  time   Visit Diagnosis:    ICD-10-CM   1. Attention deficit hyperactivity disorder (ADHD), predominantly inattentive type  F90.0 Urine drugs of abuse scrn w alc, routine (Ref Lab)      Past Psychiatric History: Please see initial evaluation for full details. I have reviewed the history. No updates at this time.     Past Medical History:  Past Medical History:  Diagnosis Date   Absence seizure (HCC)    Acute kidney failure following labor and delivery    Acute liver failure    Allergy    Anemia    Asthma    Complication of anesthesia    Depression    HELLP syndrome in first trimester 2009   HELLP syndrome in first trimester    Kidney infection    PONV (postoperative nausea and vomiting)    Seizures (HCC)     Past Surgical History:  Procedure Laterality Date   ABDOMINAL HYSTERECTOMY N/A 08/31/2016   Procedure: ABDOMINAL HYSTERECTOMY WITH REMOVAL OF CERVIX;  Surgeon: Lazaro Arms, MD;  Location: [facility];  Service: Gynecology;  Laterality: N/A;   BREAST ENHANCEMENT SURGERY     CESAREAN SECTION     SCAR REVISION  08/31/2016   Procedure: REVISION OF ABDOMINAL WALL SCAR;  Surgeon: Lazaro Arms, MD;  Location: [facility];  Service: Gynecology;;   TUBAL LIGATION      Family Psychiatric History: Please see initial evaluation for full  details. I have reviewed the history. No updates at this time.     Family History:  Family History  Adopted: Yes  Problem Relation Age of Onset   Cancer Paternal Grandfather        bladder   Heart disease Paternal Grandmother    Heart disease Maternal Grandmother    Congestive Heart Failure Maternal Grandmother    Heart disease Father [id number]       "code blue"   Alcohol abuse Father    Diabetes Father    Nevi Mother    Cancer Maternal Aunt        skin   Breast cancer Maternal Aunt    Heart disease Maternal Aunt        CHF   Breast cancer Maternal Aunt    Cancer Maternal Aunt        skin   Diabetes Other    Heart disease Other     Social  History:  Social History   Socioeconomic History   Marital status: Married    Spouse name: Shaun   Number of children: 3   Years of education: 13   Highest education level: Not on file  Occupational History   Occupation: massage therapy    Comment: by schooling   Occupation: Bake art- cakes    Comment: self  Tobacco Use   Smoking status: Former    Types: E-cigarettes    Quit date: 11/01/2011    Years since quitting: 11.2   Smokeless tobacco: Never   Tobacco comments:    02-06-2017 PER PT VAPOR.  Vaping Use   Vaping Use: Every day  Substance and Sexual Activity   Alcohol use: No   Drug use: No   Sexual activity: Yes    Birth control/protection: Surgical    Comment: hysterectomy  Other Topics Concern   Not on file  Social History Narrative   Lives at home   Husband Shaun   Three daughters   Tries to exercise   Social Determinants of Health   Financial Resource Strain: Not on file  Food Insecurity: No Food Insecurity (06/29/2021)   Hunger Vital Sign    Worried About Running Out of Food in the Last Year: Never true    Ran Out of Food in the Last Year: Never true  Transportation Needs: No Transportation Needs (06/29/2021)   PRAPARE - Administrator, Civil ServiceTransportation    Lack of Transportation (Medical): No    Lack of Transportation (Non-Medical): No  Physical Activity: Insufficiently Active (06/29/2021)   Exercise Vital Sign    Days of Exercise per Week: 2 days    Minutes of Exercise per Session: 40 min  Stress: No Stress Concern Present (06/29/2021)   [employer] - Occupational Stress Questionnaire    Feeling of Stress : Not at all  Social Connections: Socially Integrated (06/29/2021)   Social Connection and Isolation Panel [NHANES]    Frequency of Communication with Friends and Family: More than three times a week    Frequency of Social Gatherings with Friends and Family: Once a week    Attends Religious Services: More than 4 times per year    Active Member  of [employer] Financial[employer]ubs or Organizations: Yes    Attends Engineer, structuralClub or Organization Meetings: More than 4 times per year    Marital Status: Married    Allergies:  Allergies  Allergen Reactions   Ciprofloxacin Hcl Other (See Comments)    Silent seizures.   Amoxicillin     dizziness  Clindamycin/Lincomycin Nausea And Vomiting    Intercrainial hypertention   Doxycycline     Headache, Dizziness, swelling of eyelids   Minocycline Swelling   Coppertone Kids Spf15 [Albolene] Hives and Swelling   Vicodin [Hydrocodone-Acetaminophen] Other (See Comments)    Makes her feel drunk and she prefers not to take this.    Metabolic Disorder Labs: No results found for: "HGBA1C", "MPG" No results found for: "PROLACTIN" Lab Results  Component Value Date   CHOL 148 12/26/2016   TRIG 30 12/26/2016   HDL 50 (L) 12/26/2016   CHOLHDL 3.0 12/26/2016   VLDL 6 12/26/2016   LDLCALC 92 12/26/2016   Lab Results  Component Value Date   TSH 1.320 07/02/2021    Therapeutic Level Labs: No results found for: "LITHIUM" No results found for: "VALPROATE" No results found for: "CBMZ"  Current Medications: Current Outpatient Medications  Medication Sig Dispense Refill   [START ON 05/02/2023] amphetamine-dextroamphetamine (ADDERALL XR) 30 MG 24 hr capsule Take 1 capsule (30 mg total) by mouth daily. 30 capsule 0   [START ON 03/03/2023] ADDERALL XR 30 MG 24 hr capsule Take 1 capsule (30 mg total) by mouth every morning. 30 capsule 0   [START ON 04/02/2023] ADDERALL XR 30 MG 24 hr capsule Take 1 capsule (30 mg total) by mouth every morning. 30 capsule 0   amphetamine-dextroamphetamine (ADDERALL XR) 30 MG 24 hr capsule Take 1 capsule (30 mg total) by mouth every morning. 30 capsule 0   No current facility-administered medications for this visit.     Musculoskeletal: Strength & Muscle Tone:  N/A Gait & Station:  N/A Patient leans: N/A  Psychiatric Specialty Exam: Review of Systems  Psychiatric/Behavioral: Negative.    All  other systems reviewed and are negative.   Last menstrual period 08/31/2016.There is no height or weight on file to calculate BMI.  General Appearance: Fairly Groomed  Eye Contact:  Good  Speech:  Clear and Coherent  Volume:  Normal  Mood:   good  Affect:  Appropriate, Congruent, and Full Range  Thought Process:  Coherent  Orientation:  Full (Time, Place, and Person)  Thought Content: Logical   Suicidal Thoughts:  No  Homicidal Thoughts:  No  Memory:  Immediate;   Good  Judgement:  Good  Insight:  Good  Psychomotor Activity:  Normal  Concentration:  Concentration: Good and Attention Span: Good  Recall:  Good  Fund of Knowledge: Good  Language: Good  Akathisia:  No  Handed:  Right  AIMS (if indicated): not done  Assets:  Communication Skills Desire for Improvement  ADL's:  Intact  Cognition: WNL  Sleep:  Good   Screenings: GAD-7    Flowsheet Row [facility] Visit [facility]  Total GAD-7 Score 3      PHQ2-9    Flowsheet Row Office Visit from 03/07/2022 in [facility] Office Visit from 06/29/2021 in [facility] Video Visit from 03/05/2021 in [facility]  PHQ-2 Total Score 0 0 0  PHQ-9 Total Score -- 4 --      Flowsheet Row Office Visit from 03/07/2022 in [facility]  C-SSRS RISK CATEGORY No Risk        Assessment and Plan:  SHAMMARA JARRETT is a 37 y.o. year old female with a history of ADHD, who presents for follow up appointment for below.  1. Attention deficit hyperactivity disorder (ADHD), predominantly inattentive type -  (diagnosed with neuropsych test 05/2017)  She reports good benefit from Adderall, although she experiences fatigue.  She will have an appointment with provider in [place] to evaluate this.  Will continue current dose  of Adderall to target ADHD symptoms.    Plan  Continue Adderall XR 30 mg daily (name brand only) Obtain UDS Next appointment: 7/10 at 8 40, video - saw PCP in summer 2023   Past trials: Vyvanse (fatigue)   The patient demonstrates the following risk factors for suicide: Chronic risk factors for suicide include: N/A. Acute risk factors for suicide include: N/A. Protective factors for this patient include: positive social support, responsibility to others (children, family), coping skills and hope for the future. Considering these factors, the overall suicide risk at this point appears to be low. Patient is appropriate for outpatient follow up.      Collaboration of Care: Collaboration of Care: Other reviewed notes in Epic  Patient/Guardian was advised Release of Information must be obtained prior to any record release in order to collaborate their care with an outside provider. Patient/Guardian was advised if they have not already done so to contact the registration department to sign all necessary forms in order for [place] to release information regarding their care.   Consent: Patient/Guardian gives verbal consent for treatment and assignment of benefits for services provided during this visit. Patient/Guardian expressed understanding and agreed to proceed.    Alexis Hotter, MD 02/08/2023, 10:06 AM

## 2023-02-08 ENCOUNTER — Telehealth (INDEPENDENT_AMBULATORY_CARE_PROVIDER_SITE_OTHER): Payer: 59 | Admitting: Psychiatry

## 2023-02-08 ENCOUNTER — Encounter: Payer: Self-pay | Admitting: Psychiatry

## 2023-02-08 DIAGNOSIS — F900 Attention-deficit hyperactivity disorder, predominantly inattentive type: Secondary | ICD-10-CM | POA: Diagnosis not present

## 2023-02-08 MED ORDER — AMPHETAMINE-DEXTROAMPHET ER 30 MG PO CP24: 30.0000 mg | ORAL_CAPSULE | Freq: Every day | ORAL | 0 refills | Status: DC

## 2023-02-08 MED ORDER — ADDERALL XR 30 MG PO CP24: 30.0000 mg | ORAL_CAPSULE | ORAL | 0 refills | Status: DC

## 2023-02-08 NOTE — Patient Instructions (Signed)
Continue Adderall XR 30 mg daily  Obtain UDS Next appointment: 7/10 at 8 40

## 2023-02-11 LAB — URINE DRUGS OF ABUSE SCREEN W ALC, ROUTINE (REF LAB)

## 2023-02-14 ENCOUNTER — Encounter: Payer: Self-pay | Admitting: Psychiatry

## 2023-02-14 LAB — AMPHETAMINE CONF, UR
Amphetamine GC/MS Conf: >3000 ng/mL
Amphetamine: POSITIVE — AB
Amphetamines: POSITIVE — AB
Methamphetamine: NEGATIVE

## 2023-02-14 LAB — URINE DRUGS OF ABUSE SCREEN W ALC, ROUTINE (REF LAB)
Benzodiazepine Quant, Ur: NEGATIVE ng/mL
Cannabinoid Quant, Ur: NEGATIVE ng/mL
Cocaine (Metab.): NEGATIVE ng/mL
Ethanol, Urine: NEGATIVE %
Methadone Screen, Urine: NEGATIVE ng/mL
Opiate Quant, Ur: NEGATIVE ng/mL
PCP Quant, Ur: NEGATIVE ng/mL

## 2023-05-05 NOTE — Progress Notes (Unsigned)
Virtual Visit via Video Note  I connected with Alexis White on 05/10/23 at  8:40 AM EDT by a video enabled telemedicine application and verified that I am speaking with the correct person using two identifiers.  Location: Patient: home Provider: office Persons participated in the visit- patient, provider    I discussed the limitations of evaluation and management by telemedicine and the availability of in person appointments. The patient expressed understanding and agreed to proceed.    I discussed the assessment and treatment plan with the patient. The patient was provided an opportunity to ask questions and all were answered. The patient agreed with the plan and demonstrated an understanding of the instructions.   The patient was advised to call back or seek an in-person evaluation if the symptoms worsen or if the condition fails to improve as anticipated.  I provided 15 minutes of non-face-to-face time during this encounter.   [name], MD   [facility] MD/PA/NP OP Progress Note  05/10/2023 9:08 AM Alexis White  MRN:  [id number]  Chief Complaint:  Chief Complaint  Patient presents with   Follow-up   HPI:  This is a follow-up appointment for ADHD.  She states that she was seen by functional medicine.  She was told that she has hormonal imbalance, and was started on progesterone cream.  She was also told that she has systemic fungal infection, and is taking fluconazole for 30 days.  She was found to have vitamin D deficiency, and low folic acid and B12.  She is also on the fish oil.  Although she feels nauseated and fatigue from the supplements/fluconazole, she thinks her mood has been better.  She feels more herself, and has less mood swing.  She also think Adderall has been working better.  She has good focus, and has been able to complete tasks.  She sleeps well except having issues with her medication.  Her appetite was good prior to start the medication.  She denies SI.  She  feels comfortable to stay on the current dose of Adderall.   Visit Diagnosis:    ICD-10-CM   1. Attention deficit hyperactivity disorder (ADHD), predominantly inattentive type  F90.0       Past Psychiatric History: Please see initial evaluation for full details. I have reviewed the history. No updates at this time.     Past Medical History:  Past Medical History:  Diagnosis Date   Absence seizure (HCC)    Acute kidney failure following labor and delivery    Acute liver failure    Allergy    Anemia    Asthma    Complication of anesthesia    Depression    HELLP syndrome in first trimester 2009   HELLP syndrome in first trimester    Kidney infection    PONV (postoperative nausea and vomiting)    Seizures (HCC)     Past Surgical History:  Procedure Laterality Date   ABDOMINAL HYSTERECTOMY N/A 08/31/2016   Procedure: ABDOMINAL HYSTERECTOMY WITH REMOVAL OF CERVIX;  Surgeon: Lazaro Arms, MD;  Location: [facility];  Service: Gynecology;  Laterality: N/A;   BREAST ENHANCEMENT SURGERY     CESAREAN SECTION     SCAR REVISION  08/31/2016   Procedure: REVISION OF ABDOMINAL WALL SCAR;  Surgeon: Lazaro Arms, MD;  Location: [facility];  Service: Gynecology;;   TUBAL LIGATION      Family Psychiatric History: Please see initial evaluation for full details. I have reviewed the history. No updates  at this time.     Family History:  Family History  Adopted: Yes  Problem Relation Age of Onset   Cancer Paternal Grandfather        bladder   Heart disease Paternal Grandmother    Heart disease Maternal Grandmother    Congestive Heart Failure Maternal Grandmother    Heart disease Father 37       "code blue"   Alcohol abuse Father    Diabetes Father    Nevi Mother    Cancer Maternal Aunt        skin   Breast cancer Maternal Aunt    Heart disease Maternal Aunt        CHF   Breast cancer Maternal Aunt    Cancer Maternal Aunt        skin   Diabetes Other    Heart disease Other      Social History:  Social History   Socioeconomic History   Marital status: Married    Spouse name: Shaun   Number of children: 3   Years of education: 13   Highest education level: Not on file  Occupational History   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  Vaping Use   Vaping Use: Every day  Substance and Sexual Activity   Alcohol use: No   Drug use: No   Sexual activity: Yes    Birth control/protection: Surgical    Comment: hysterectomy  Other Topics Concern   Not on file  Social History Narrative   Lives at home   Husband Shaun   Three daughters   Tries to exercise   Social Determinants of Health   Financial Resource Strain: Not on file  Food Insecurity: No Food Insecurity (06/29/2021)   Hunger Vital Sign    Worried About Running Out of Food in the Last Year: Never true    Ran Out of Food in the Last Year: Never true  Transportation Needs: No Transportation Needs (06/29/2021)   PRAPARE - Administrator, Civil Service (Medical): No    Lack of Transportation (Non-Medical): No  Physical Activity: Insufficiently Active (06/29/2021)   Exercise Vital Sign    Days of Exercise per Week: 2 days    Minutes of Exercise per Session: 40 min  Stress: No Stress Concern Present (06/29/2021)   [employer] of Occupational Health - Occupational Stress Questionnaire    Feeling of Stress : Not at all  Social Connections: Socially Integrated (06/29/2021)   Social Connection and Isolation Panel [NHANES]    Frequency of Communication with Friends and Family: More than three times a week    Frequency of Social Gatherings with Friends and Family: Once a week    Attends Religious Services: More than 4 times per year     Active Member of [employer] or Organizations: Yes    Attends Engineer, structural: More than 4 times per year    Marital Status: Married    Allergies:  Allergies  Allergen Reactions   Ciprofloxacin Hcl Other (See Comments)    Silent seizures.   Amoxicillin     dizziness   Clindamycin/Lincomycin Nausea And Vomiting    Intercrainial  hypertention   Doxycycline     Headache, Dizziness, swelling of eyelids   Minocycline Swelling   Coppertone Kids Spf15 [Albolene] Hives and Swelling   Vicodin [Hydrocodone-Acetaminophen] Other (See Comments)    Makes her feel drunk and she prefers not to take this.    Metabolic Disorder Labs: No results found for: "HGBA1C", "MPG" No results found for: "PROLACTIN" Lab Results  Component Value Date   CHOL 148 12/26/2016   TRIG 30 12/26/2016   HDL 50 (L) 12/26/2016   CHOLHDL 3.0 12/26/2016   VLDL 6 12/26/2016   LDLCALC 92 12/26/2016   Lab Results  Component Value Date   TSH 1.320 07/02/2021    Therapeutic Level Labs: No results found for: "LITHIUM" No results found for: "VALPROATE" No results found for: "CBMZ"  Current Medications: Current Outpatient Medications  Medication Sig Dispense Refill   [START ON 08/01/2023] amphetamine-dextroamphetamine (ADDERALL XR) 30 MG 24 hr capsule Take 1 capsule (30 mg total) by mouth daily. 30 capsule 0   [START ON 06/02/2023] ADDERALL XR 30 MG 24 hr capsule Take 1 capsule (30 mg total) by mouth every morning. 30 capsule 0   [START ON 07/02/2023] ADDERALL XR 30 MG 24 hr capsule Take 1 capsule (30 mg total) by mouth every morning. 30 capsule 0   amphetamine-dextroamphetamine (ADDERALL XR) 30 MG 24 hr capsule Take 1 capsule (30 mg total) by mouth every morning. 30 capsule 0   amphetamine-dextroamphetamine (ADDERALL XR) 30 MG 24 hr capsule Take 1 capsule (30 mg total) by mouth daily. 30 capsule 0   No current facility-administered medications for this visit.     Musculoskeletal: Strength & Muscle Tone:   N/A Gait & Station:  N/A Patient leans: N/A  Psychiatric Specialty Exam: Review of Systems  Psychiatric/Behavioral:  Negative for agitation, behavioral problems, confusion, decreased concentration, dysphoric mood, hallucinations, self-injury, sleep disturbance and suicidal ideas. The patient is not nervous/anxious and is not hyperactive.   All other systems reviewed and are negative.   Last menstrual period 08/31/2016.There is no height or weight on file to calculate BMI.  General Appearance: Fairly Groomed  Eye Contact:  Good  Speech:  Clear and Coherent  Volume:  Normal  Mood:   good  Affect:  Appropriate, Congruent, and calm  Thought Process:  Coherent  Orientation:  Full (Time, Place, and Person)  Thought Content: Logical   Suicidal Thoughts:  No  Homicidal Thoughts:  No  Memory:  Immediate;   Good  Judgement:  Good  Insight:  Good  Psychomotor Activity:  Normal  Concentration:  Concentration: Good and Attention Span: Good  Recall:  Good  Fund of Knowledge: Good  Language: Good  Akathisia:  No  Handed:  Right  AIMS (if indicated): not done  Assets:  Communication Skills Desire for Improvement  ADL's:  Intact  Cognition: WNL  Sleep:  Fair   Screenings: GAD-7    Garment/textile technologist Visit from 06/29/2021 in [facility]  Total GAD-7 Score 3      PHQ2-9    Flowsheet Row Office Visit from 03/07/2022 in [facility] Office Visit from 06/29/2021 in [facility] Video Visit from 03/05/2021 in [facility]  PHQ-2 Total Score 0 0 0  PHQ-9 Total Score -- 4 --      Flowsheet Row Office Visit from 03/07/2022 in [facility]  C-SSRS RISK  CATEGORY No Risk        Assessment and Plan:  Alexis White is a 37 y.o. year old female with a history of ADHD, who presents for  follow up appointment for below.   1. Attention deficit hyperactivity disorder (ADHD), predominantly inattentive type -  (diagnosed with neuropsych test 05/2017), UDS positive for amphetamine only 01/2023   She reports good benefit from Adderall.  Will continue current dose of Adderall to target ADHD symptoms.   # systemic fungal infection per report She was reportedly diagnosed with the above along with low progesterone level, and other low vitamin levels. Will obtain record from the practice.   Plan  Continue Adderall XR 30 mg daily (name brand only) Next appointment: 10/9 at 1 20, video Obtain record from [facility] [place] - saw PCP in summer 2023   Past trials: Vyvanse (fatigue)   The patient demonstrates the following risk factors for suicide: Chronic risk factors for suicide include: N/A. Acute risk factors for suicide include: N/A. Protective factors for this patient include: positive social support, responsibility to others (children, family), coping skills and hope for the future. Considering these factors, the overall suicide risk at this point appears to be low. Patient is appropriate for outpatient follow up.      Collaboration of Care: Collaboration of Care: Other reviewed notes in Epic  Patient/Guardian was advised Release of Information must be obtained prior to any record release in order to collaborate their care with an outside provider. Patient/Guardian was advised if they have not already done so to contact the registration department to sign all necessary forms in order for [place] to release information regarding their care.   Consent: Patient/Guardian gives verbal consent for treatment and assignment of benefits for services provided during this visit. Patient/Guardian expressed understanding and agreed to proceed.    [name], MD 05/10/2023, 9:08 AM

## 2023-05-10 ENCOUNTER — Encounter: Payer: Self-pay | Admitting: Psychiatry

## 2023-05-10 ENCOUNTER — Telehealth (INDEPENDENT_AMBULATORY_CARE_PROVIDER_SITE_OTHER): Payer: 59 | Admitting: Psychiatry

## 2023-05-10 DIAGNOSIS — F900 Attention-deficit hyperactivity disorder, predominantly inattentive type: Secondary | ICD-10-CM | POA: Diagnosis not present

## 2023-05-10 MED ORDER — AMPHETAMINE-DEXTROAMPHET ER 30 MG PO CP24: 30.0000 mg | ORAL_CAPSULE | Freq: Every day | ORAL | 0 refills | Status: DC

## 2023-05-10 MED ORDER — ADDERALL XR 30 MG PO CP24: 30.0000 mg | ORAL_CAPSULE | ORAL | 0 refills | Status: DC

## 2023-08-04 NOTE — Progress Notes (Unsigned)
Virtual Visit via Video Note  I connected with Alexis White on 08/09/23 at  1:20 PM EDT by a video enabled telemedicine application and verified that I am speaking with the correct person using two identifiers.  Location: Patient: home Provider: office Persons participated in the visit- patient, provider    I discussed the limitations of evaluation and management by telemedicine and the availability of in person appointments. The patient expressed understanding and agreed to proceed.     I discussed the assessment and treatment plan with the patient. The patient was provided an opportunity to ask questions and all were answered. The patient agreed with the plan and demonstrated an understanding of the instructions.   The patient was advised to call back or seek an in-person evaluation if the symptoms worsen or if the condition fails to improve as anticipated.  I provided 15 minutes of non-face-to-face time during this encounter.   Alexis Hotter, MD    [facility] MD/PA/NP OP Progress Note  08/09/2023 1:46 PM Alexis White  MRN:  [id number]  Chief Complaint:  Chief Complaint  Patient presents with   Follow-up   HPI:  This is a follow-up appointment for ADHD.  [facility]  She was started on inositol supplement, and has been working on with natural remedies for PCOS.  She was restarted on fluconazole as her symptoms of joint pain come back after discontinuation of fluconazole.  She believes she has more energy, and feels Adderall has been working even better since being under the treatment for hormonal issues.  She will have another whole blood panel in several months.  Her children has been doing good.  She helps coaching softball.  She thinks she has been doing well, and she denies concern.  She sleeps well most of the time.  She denies feeling depressed or anxiety.  She denies SI.  She thinks her focus is good, and feels  comfortable to stay on the current medication.   168 lbs Wt Readings from Last 3 Encounters:  03/07/22 167 lb 6.4 oz (75.9 kg)  01/05/22 162 lb (73.5 kg)  06/29/21 163 lb 6.4 oz (74.1 kg)     Visit Diagnosis:    ICD-10-CM   1. Attention deficit hyperactivity disorder (ADHD), predominantly inattentive type  F90.0       Past Psychiatric History: Please see initial evaluation for full details. I have reviewed the history. No updates at this time.     Past Medical History:  Past Medical History:  Diagnosis Date   Absence seizure (HCC)    Acute kidney failure following labor and delivery    Acute liver failure    Allergy    Anemia    Asthma    Complication of anesthesia    Depression    HELLP syndrome in first trimester 2009   HELLP syndrome in first trimester    Kidney infection    PONV (postoperative nausea and vomiting)    Seizures (HCC)     Past Surgical History:  Procedure Laterality Date   ABDOMINAL HYSTERECTOMY N/A 08/31/2016   Procedure: ABDOMINAL HYSTERECTOMY WITH REMOVAL OF CERVIX;  Surgeon: Lazaro Arms, MD;  Location: [facility];  Service: Gynecology;  Laterality: N/A;   BREAST ENHANCEMENT SURGERY     CESAREAN SECTION     SCAR REVISION  08/31/2016   Procedure: REVISION OF ABDOMINAL WALL SCAR;  Surgeon: Lazaro Arms, MD;  Location: [facility];  Service: Gynecology;;  TUBAL LIGATION      Family Psychiatric History: Please see initial evaluation for full details. I have reviewed the history. No updates at this time.     Family History:  Family History  Adopted: Yes  Problem Relation Age of Onset   Cancer Paternal Grandfather        bladder   Heart disease Paternal Grandmother    Heart disease Maternal Grandmother    Congestive Heart Failure Maternal Grandmother    Heart disease Father 61       "code blue"   Alcohol abuse Father    Diabetes Father    Nevi Mother    Cancer Maternal Aunt        skin   Breast cancer Maternal Aunt    Heart disease Maternal  Aunt        CHF   Breast cancer Maternal Aunt    Cancer Maternal Aunt        skin   Diabetes Other    Heart disease Other     Social History:  Social History   Socioeconomic History   Marital status: Married    Spouse name: Shaun   Number of children: 3   Years of education: 13   Highest education level: Not on file  Occupational History   Occupation: massage therapy    Comment: by schooling   Occupation: Bake art- cakes    Comment: self  Tobacco Use   Smoking status: Former    Types: E-cigarettes    Quit date: 11/01/2011    Years since quitting: 11.7   Smokeless tobacco: Never   Tobacco comments:    02-06-2017 PER PT [facility].  Vaping Use   Vaping status: Every Day  Substance and Sexual Activity   Alcohol use: No   Drug use: No   Sexual activity: Yes    Birth control/protection: Surgical    Comment: hysterectomy  Other Topics Concern   Not on file  Social History Narrative   Lives at home   Husband Shaun   Three daughters   Tries to exercise   Social Determinants of Health   Financial Resource Strain: Not on file  Food Insecurity: No Food Insecurity (06/29/2021)   Hunger Vital Sign    Worried About Running Out of Food in the Last Year: Never true    Ran Out of Food in the Last Year: Never true  Transportation Needs: No Transportation Needs (06/29/2021)   PRAPARE - Administrator, Civil Service (Medical): No    Lack of Transportation (Non-Medical): No  Physical Activity: Insufficiently Active (06/29/2021)   Exercise Vital Sign    Days of Exercise per Week: 2 days    Minutes of Exercise per Session: 40 min  Stress: No Stress Concern Present (06/29/2021)   [employer] of Occupational Health - Occupational Stress Questionnaire    Feeling of Stress : Not at all  Social Connections: Socially Integrated (06/29/2021)   Social Connection and Isolation Panel [NHANES]    Frequency of Communication with Friends and Family: More than three times a week     Frequency of Social Gatherings with Friends and Family: Once a week    Attends Religious Services: More than 4 times per year    Active Member of [employer] or Organizations: Yes    Attends Engineer, structural: More than 4 times per year    Marital Status: Married    Allergies:  Allergies  Allergen Reactions   Ciprofloxacin Hcl Other (See  Comments)    Silent seizures.   Amoxicillin     dizziness   Clindamycin/Lincomycin Nausea And Vomiting    Intercrainial hypertention   Doxycycline     Headache, Dizziness, swelling of eyelids   Minocycline Swelling   Coppertone Kids Spf15 [Albolene] Hives and Swelling   Vicodin [Hydrocodone-Acetaminophen] Other (See Comments)    Makes her feel drunk and she prefers not to take this.    Metabolic Disorder Labs: No results found for: "HGBA1C", "MPG" No results found for: "PROLACTIN" Lab Results  Component Value Date   CHOL 148 12/26/2016   TRIG 30 12/26/2016   HDL 50 (L) 12/26/2016   CHOLHDL 3.0 12/26/2016   VLDL 6 12/26/2016   LDLCALC 92 12/26/2016   Lab Results  Component Value Date   TSH 1.320 07/02/2021    Therapeutic Level Labs: No results found for: "LITHIUM" No results found for: "VALPROATE" No results found for: "CBMZ"  Current Medications: Current Outpatient Medications  Medication Sig Dispense Refill   fluconazole (DIFLUCAN) 200 MG tablet Take 200 mg by mouth daily.     ADDERALL XR 30 MG 24 hr capsule Take 1 capsule (30 mg total) by mouth every morning. 30 capsule 0   [START ON 10/29/2023] ADDERALL XR 30 MG 24 hr capsule Take 1 capsule (30 mg total) by mouth every morning. 30 capsule 0   amphetamine-dextroamphetamine (ADDERALL XR) 30 MG 24 hr capsule Take 1 capsule (30 mg total) by mouth daily. 30 capsule 0   [START ON 08/30/2023] amphetamine-dextroamphetamine (ADDERALL XR) 30 MG 24 hr capsule Take 1 capsule (30 mg total) by mouth daily. 30 capsule 0   [START ON 09/29/2023] amphetamine-dextroamphetamine (ADDERALL  XR) 30 MG 24 hr capsule Take 1 capsule (30 mg total) by mouth every morning. 30 capsule 0   No current facility-administered medications for this visit.     Musculoskeletal: Strength & Muscle Tone:  N/A Gait & Station:  N/A Patient leans: N/A  Psychiatric Specialty Exam: Review of Systems  Psychiatric/Behavioral: Negative.    All other systems reviewed and are negative.   Last menstrual period 08/31/2016.There is no height or weight on file to calculate BMI.  General Appearance: Well Groomed  Eye Contact:  Good  Speech:  Clear and Coherent  Volume:  Normal  Mood:   good  Affect:  Appropriate, Congruent, and Full Range  Thought Process:  Coherent  Orientation:  Full (Time, Place, and Person)  Thought Content: Logical   Suicidal Thoughts:  No  Homicidal Thoughts:  No  Memory:  Immediate;   Good  Judgement:  Good  Insight:  Good  Psychomotor Activity:  Normal  Concentration:  Concentration: Good and Attention Span: Good  Recall:  Good  Fund of Knowledge: Good  Language: Good  Akathisia:  No  Handed:  Right  AIMS (if indicated): not done  Assets:  Communication Skills Desire for Improvement  ADL's:  Intact  Cognition: WNL  Sleep:  Good   Screenings: GAD-7    Flowsheet Row Office Visit from 06/29/2021 in [facility] for [facility]  Total GAD-7 Score 3      PHQ2-9    Flowsheet Row Office Visit from 03/07/2022 in [facility] Office Visit from 06/29/2021 in [facility] Video Visit from 03/05/2021 in [facility]  PHQ-2 Total Score 0 0 0  PHQ-9 Total Score -- 4 --      Flowsheet  Row Office Visit from 03/07/2022 in [facility]  C-SSRS RISK CATEGORY No Risk        Assessment and Plan:  TAHNEE CIFUENTES is a 37 y.o. year old female with a history of ADHD, who presents for  follow up appointment for below.   1. Attention deficit hyperactivity disorder (ADHD), predominantly inattentive type -  (diagnosed with neuropsych test 05/2017), UDS positive for amphetamine only 01/2023    She reports good benefit from Adderall.  Will continue the current dose to target ADHD.   # systemic fungal infection per report - she is seen at [facility] She was diagnosed with the above along with low progesterone level, and other low vitamin levels.  She is now started on inositol, and was restarted on fluconazole.  She reports subjective improvement in variety of symptoms since this intervention.  Will continue to assess as needed.    Plan  Continue Adderall XR 30 mg daily (name brand only)  Next appointment: 1/8 at 1 20,  Obtain record from - saw PCP in summer 2023   Past trials: Vyvanse (fatigue)   The patient demonstrates the following risk factors for suicide: Chronic risk factors for suicide include: N/A. Acute risk factors for suicide include: N/A. Protective factors for this patient include: positive social support, responsibility to others (children, family), coping skills and hope for the future. Considering these factors, the overall suicide risk at this point appears to be low. Patient is appropriate for outpatient follow up.      Collaboration of Care: Collaboration of Care: Other reviewed notes in Epic  Patient/Guardian was advised Release of Information must be obtained prior to any record release in order to collaborate their care with an outside provider. Patient/Guardian was advised if they have not already done so to contact the registration department to sign all necessary forms in order for [place] to release information regarding their care.   Consent: Patient/Guardian gives verbal consent for treatment and assignment of benefits for services provided during this visit. Patient/Guardian expressed understanding and agreed to proceed.    I have utilized the [place] Controlled Substances Reporting System (PMP AWARxE) to confirm adherence regarding the patient's medication. My review reveals appropriate prescription fills.    Alexis Hotter, MD 08/09/2023, 1:46 PM

## 2023-08-09 ENCOUNTER — Telehealth (INDEPENDENT_AMBULATORY_CARE_PROVIDER_SITE_OTHER): Payer: 59 | Admitting: Psychiatry

## 2023-08-09 ENCOUNTER — Encounter: Payer: Self-pay | Admitting: Psychiatry

## 2023-08-09 DIAGNOSIS — F900 Attention-deficit hyperactivity disorder, predominantly inattentive type: Secondary | ICD-10-CM

## 2023-08-09 MED ORDER — ADDERALL XR 30 MG PO CP24: 30.0000 mg | ORAL_CAPSULE | ORAL | 0 refills | Status: DC

## 2023-08-09 MED ORDER — AMPHETAMINE-DEXTROAMPHET ER 30 MG PO CP24: 30.0000 mg | ORAL_CAPSULE | ORAL | 0 refills | Status: DC

## 2023-08-09 MED ORDER — AMPHETAMINE-DEXTROAMPHET ER 30 MG PO CP24: 30.0000 mg | ORAL_CAPSULE | Freq: Every day | ORAL | 0 refills | Status: DC

## 2023-11-04 NOTE — Progress Notes (Deleted)
 [facility] MD/PA/NP OP Progress Note  11/04/2023 4:03 PM Alexis White  MRN:  [id number]  Chief Complaint: No chief complaint on file.  HPI: *** Visit Diagnosis: No diagnosis found.  Past Psychiatric History: Please see initial evaluation for full details. I have reviewed the history. No updates at this time.     Past Medical History:  Past Medical History:  Diagnosis Date   Absence seizure (HCC)    Acute kidney failure following labor and delivery    Acute liver failure    Allergy    Anemia    Asthma    Complication of anesthesia    Depression    HELLP syndrome in first trimester 2009   HELLP syndrome in first trimester    Kidney infection    PONV (postoperative nausea and vomiting)    Seizures (HCC)     Past Surgical History:  Procedure Laterality Date   ABDOMINAL HYSTERECTOMY N/A 08/31/2016   Procedure: ABDOMINAL HYSTERECTOMY WITH REMOVAL OF CERVIX;  Surgeon: Vonn VEAR Inch, MD;  Location: [facility];  Service: Gynecology;  Laterality: N/A;   BREAST ENHANCEMENT SURGERY     CESAREAN SECTION     SCAR REVISION  08/31/2016   Procedure: REVISION OF ABDOMINAL WALL SCAR;  Surgeon: Vonn VEAR Inch, MD;  Location: [facility];  Service: Gynecology;;   TUBAL LIGATION      Family Psychiatric History: Please see initial evaluation for full details. I have reviewed the history. No updates at this time.     Family History:  Family History  Adopted: Yes  Problem Relation Age of Onset   Cancer Paternal Grandfather        bladder   Heart disease Paternal Grandmother    Heart disease Maternal Grandmother    Congestive Heart Failure Maternal Grandmother    Heart disease Father 11       code blue   Alcohol abuse Father    Diabetes Father    Nevi Mother    Cancer Maternal Aunt        skin   Breast cancer Maternal Aunt    Heart disease Maternal Aunt        CHF   Breast cancer Maternal Aunt    Cancer Maternal Aunt        skin   Diabetes Other    Heart disease Other     Social History:   Social History   Socioeconomic History   Marital status: Married    Spouse name: Shaun   Number of children: 3   Years of education: 13   Highest education level: Not on file  Occupational History   Occupation: massage therapy    Comment: by schooling   Occupation: Bake art- cakes    Comment: self  Tobacco Use   Smoking status: Former    Types: E-cigarettes    Quit date: 11/01/2011    Years since quitting: 12.0   Smokeless tobacco: Never   Tobacco comments:    02-06-2017 PER PT [facility].  Vaping Use   Vaping status: Every Day  Substance and Sexual Activity   Alcohol use: No   Drug use: No   Sexual activity: Yes    Birth control/protection: Surgical    Comment: hysterectomy  Other Topics Concern   Not on file  Social History Narrative   Lives at home   Husband Shaun   Three daughters   Tries to exercise   Social Drivers of Health   Financial Resource Strain: Not on file  Food Insecurity: No Food Insecurity (06/29/2021)   Hunger Vital Sign    Worried About Running Out of Food in the Last Year: Never true    Ran Out of Food in the Last Year: Never true  Transportation Needs: No Transportation Needs (06/29/2021)   PRAPARE - Administrator, Civil Service (Medical): No    Lack of Transportation (Non-Medical): No  Physical Activity: Insufficiently Active (06/29/2021)   Exercise Vital Sign    Days of Exercise per Week: 2 days    Minutes of Exercise per Session: 40 min  Stress: No Stress Concern Present (06/29/2021)   [employer] of Occupational Health - Occupational Stress Questionnaire    Feeling of Stress : Not at all  Social Connections: Socially Integrated (06/29/2021)   Social Connection and Isolation Panel [NHANES]    Frequency of Communication with Friends and Family: More than three times a week    Frequency of Social Gatherings with Friends and Family: Once a week    Attends Religious Services: More than 4 times per year    Active Member of [employer] or  Organizations: Yes    Attends Engineer, Structural: More than 4 times per year    Marital Status: Married    Allergies:  Allergies  Allergen Reactions   Ciprofloxacin Hcl Other (See Comments)    Silent seizures.   Amoxicillin     dizziness   Clindamycin/Lincomycin Nausea And Vomiting    Intercrainial hypertention   Doxycycline     Headache, Dizziness, swelling of eyelids   Minocycline Swelling   Coppertone Kids Spf15 [Albolene] Hives and Swelling   Vicodin [Hydrocodone-Acetaminophen ] Other (See Comments)    Makes her feel drunk and she prefers not to take this.    Metabolic Disorder Labs: No results found for: HGBA1C, MPG No results found for: PROLACTIN Lab Results  Component Value Date   CHOL 148 12/26/2016   TRIG 30 12/26/2016   HDL 50 (L) 12/26/2016   CHOLHDL 3.0 12/26/2016   VLDL 6 12/26/2016   LDLCALC 92 12/26/2016   Lab Results  Component Value Date   TSH 1.320 07/02/2021    Therapeutic Level Labs: No results found for: LITHIUM No results found for: VALPROATE No results found for: CBMZ  Current Medications: Current Outpatient Medications  Medication Sig Dispense Refill   ADDERALL  XR 30 MG 24 hr capsule Take 1 capsule (30 mg total) by mouth every morning. 30 capsule 0   ADDERALL  XR 30 MG 24 hr capsule Take 1 capsule (30 mg total) by mouth every morning. 30 capsule 0   amphetamine -dextroamphetamine  (ADDERALL  XR) 30 MG 24 hr capsule Take 1 capsule (30 mg total) by mouth daily. 30 capsule 0   amphetamine -dextroamphetamine  (ADDERALL  XR) 30 MG 24 hr capsule Take 1 capsule (30 mg total) by mouth daily. 30 capsule 0   amphetamine -dextroamphetamine  (ADDERALL  XR) 30 MG 24 hr capsule Take 1 capsule (30 mg total) by mouth every morning. 30 capsule 0   fluconazole (DIFLUCAN) 200 MG tablet Take 200 mg by mouth daily.     No current facility-administered medications for this visit.     Musculoskeletal: Strength & Muscle Tone:  N/A Gait &  Station:  N/A Patient leans: N/A  Psychiatric Specialty Exam: Review of Systems  Last menstrual period 08/31/2016.There is no height or weight on file to calculate BMI.  General Appearance: {Appearance:22683}  Eye Contact:  {BHH EYE CONTACT:22684}  Speech:  Clear and Coherent  Volume:  Normal  Mood:  {BHH  FNNI:77693}  Affect:  {Affect (PAA):22687}  Thought Process:  Coherent  Orientation:  Full (Time, Place, and Person)  Thought Content: Logical   Suicidal Thoughts:  {ST/HT (PAA):22692}  Homicidal Thoughts:  {ST/HT (PAA):22692}  Memory:  Immediate;   Good  Judgement:  {Judgement (PAA):22694}  Insight:  {Insight (PAA):22695}  Psychomotor Activity:  Normal  Concentration:  Concentration: Good and Attention Span: Good  Recall:  Good  Fund of Knowledge: Good  Language: Good  Akathisia:  No  Handed:  Right  AIMS (if indicated): not done  Assets:  Communication Skills Desire for Improvement  ADL's:  Intact  Cognition: WNL  Sleep:  {BHH GOOD/FAIR/POOR:22877}   Screenings: GAD-7    Flowsheet Row Office Visit from 06/29/2021 in [facility]  Total GAD-7 Score 3      PHQ2-9    Flowsheet Row Office Visit from 03/07/2022 in [facility] Office Visit from 06/29/2021 in [facility] Video Visit from 03/05/2021 in [facility]  PHQ-2 Total Score 0 0 0  PHQ-9 Total Score -- 4 --      Flowsheet Row Office Visit from 03/07/2022 in [facility]  C-SSRS RISK CATEGORY No Risk        Assessment and Plan:  KATELAND LEISINGER is a 38 y.o. year old female with a history of ADHD, who presents for follow up appointment for below.    1. Attention deficit hyperactivity disorder (ADHD), predominantly inattentive type -  (diagnosed with neuropsych test 05/2017), UDS positive for amphetamine  only  01/2023    She reports good benefit from Adderall .  Will continue the current dose to target ADHD.    # systemic fungal infection per report - she is seen at [facility]  She was diagnosed with the above along with low progesterone level, and other low vitamin levels.  She is now started on inositol, and was restarted on fluconazole.  She reports subjective improvement in variety of symptoms since this intervention.  Will continue to assess as needed.    Plan  Continue Adderall  XR 30 mg daily (name brand only)  Next appointment: 1/8 at 1 20,  Obtain record from - saw PCP in summer 2023   Past trials: Vyvanse  (fatigue)   The patient demonstrates the following risk factors for suicide: Chronic risk factors for suicide include: N/A. Acute risk factors for suicide include: N/A. Protective factors for this patient include: positive social support, responsibility to others (children, family), coping skills and hope for the future. Considering these factors, the overall suicide risk at this point appears to be low. Patient is appropriate for outpatient follow up.      Collaboration of Care: Collaboration of Care: {BH OP Collaboration of Care:21014065}  Patient/Guardian was advised Release of Information must be obtained prior to any record release in order to collaborate their care with an outside provider. Patient/Guardian was advised if they have not already done so to contact the registration department to sign all necessary forms in order for us  to release information regarding their care.   Consent: Patient/Guardian gives verbal consent for treatment and assignment of benefits for services provided during this visit. Patient/Guardian expressed understanding and agreed to proceed.    Katheren Sleet, MD 11/04/2023, 4:03 PM

## 2023-11-07 ENCOUNTER — Telehealth: Payer: 59 | Admitting: Psychiatry

## 2023-11-07 ENCOUNTER — Encounter: Payer: Self-pay | Admitting: Psychiatry

## 2023-11-07 DIAGNOSIS — F900 Attention-deficit hyperactivity disorder, predominantly inattentive type: Secondary | ICD-10-CM | POA: Diagnosis not present

## 2023-11-07 MED ORDER — ADDERALL XR 30 MG PO CP24: 30.0000 mg | ORAL_CAPSULE | ORAL | 0 refills | Status: DC

## 2023-11-07 NOTE — Progress Notes (Signed)
 Virtual Visit via Video Note  I connected with Alexis White on 11/07/23 at 11:00 AM EST by a video enabled telemedicine application and verified that I am speaking with the correct person using two identifiers.  Location: Patient: home Provider: office Persons participated in the visit- patient, provider    I discussed the limitations of evaluation and management by telemedicine and the availability of in person appointments. The patient expressed understanding and agreed to proceed.   I discussed the assessment and treatment plan with the patient. The patient was provided an opportunity to ask questions and all were answered. The patient agreed with the plan and demonstrated an understanding of the instructions.   The patient was advised to call back or seek an in-person evaluation if the symptoms worsen or if the condition fails to improve as anticipated.  I provided 20 minutes of non-face-to-face time during this encounter.   Katheren Sleet, MD    [facility] MD/PA/NP OP Progress Note  11/07/2023 11:33 AM Alexis White  MRN:  [id number]  Chief Complaint:  Chief Complaint  Patient presents with   Follow-up   HPI:  This is a follow-up appointment for ADHD.  She states that she has been doing well.  Her children has been doing well.  Her husband is not going to school anymore, and is looking for a job.  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  She denies concern about concentration.  She reports improvement in skin condition, vision, joint pain after completion of fluconazole.  She has an upcoming [facility].  She denies insomnia.  She has good appetite.  She denies feeling depressed or anxiety.  She feels comfortable to stay on the current medication.   Wt Readings from Last 3 Encounters:  03/07/22 167 lb 6.4 oz (75.9 kg)  01/05/22 162 lb (73.5 kg)  06/29/21 163 lb 6.4 oz (74.1 kg)     Visit Diagnosis:    ICD-10-CM   1. Attention deficit  hyperactivity disorder (ADHD), predominantly inattentive type  F90.0       Past Psychiatric History: Please see initial evaluation for full details. I have reviewed the history. No updates at this time.     Past Medical History:  Past Medical History:  Diagnosis Date   Absence seizure (HCC)    Acute kidney failure following labor and delivery    Acute liver failure    Allergy    Anemia    Asthma    Complication of anesthesia    Depression    HELLP syndrome in first trimester 2009   HELLP syndrome in first trimester    Kidney infection    PONV (postoperative nausea and vomiting)    Seizures (HCC)     Past Surgical History:  Procedure Laterality Date   ABDOMINAL HYSTERECTOMY N/A 08/31/2016   Procedure: ABDOMINAL HYSTERECTOMY WITH REMOVAL OF CERVIX;  Surgeon: Vonn VEAR Inch, MD;  Location: [facility];  Service: Gynecology;  Laterality: N/A;   BREAST ENHANCEMENT SURGERY     CESAREAN SECTION     SCAR REVISION  08/31/2016   Procedure: REVISION OF ABDOMINAL WALL SCAR;  Surgeon: Vonn VEAR Inch, MD;  Location: [facility];  Service: Gynecology;;   TUBAL LIGATION      Family Psychiatric History: Please see initial evaluation for full details. I have reviewed the history. No updates at this time.     Family History:  Family History  Adopted: Yes  Problem Relation Age of Onset  Cancer Paternal Grandfather        bladder   Heart disease Paternal Grandmother    Heart disease Maternal Grandmother    Congestive Heart Failure Maternal Grandmother    Heart disease Father 68       code blue   Alcohol abuse Father    Diabetes Father    Nevi Mother    Cancer Maternal Aunt        skin   Breast cancer Maternal Aunt    Heart disease Maternal Aunt        CHF   Breast cancer Maternal Aunt    Cancer Maternal Aunt        skin   Diabetes Other    Heart disease Other     Social History:  Social History   Socioeconomic History   Marital status: Married    Spouse name: Alexis White   Number of  children: 3   Years of education: 13   Highest education level: Not on file  Occupational History   Occupation: massage therapy    Comment: by schooling   Occupation: Bake art- cakes    Comment: self  Tobacco Use   Smoking status: Former    Types: E-cigarettes    Quit date: 11/01/2011    Years since quitting: 12.0   Smokeless tobacco: Never   Tobacco comments:    02-06-2017 PER PT [facility].  Vaping Use   Vaping status: Every Day  Substance and Sexual Activity   Alcohol use: No   Drug use: No   Sexual activity: Yes    Birth control/protection: Surgical    Comment: hysterectomy  Other Topics Concern   Not on file  Social History Narrative   Lives at home   Husband Alexis White   Three daughters   Tries to exercise   Social Drivers of Health   Financial Resource Strain: Not on file  Food Insecurity: No Food Insecurity (06/29/2021)   Hunger Vital Sign    Worried About Running Out of Food in the Last Year: Never true    Ran Out of Food in the Last Year: Never true  Transportation Needs: No Transportation Needs (06/29/2021)   PRAPARE - Administrator, Civil Service (Medical): No    Lack of Transportation (Non-Medical): No  Physical Activity: Insufficiently Active (06/29/2021)   Exercise Vital Sign    Days of Exercise per Week: 2 days    Minutes of Exercise per Session: 40 min  Stress: No Stress Concern Present (06/29/2021)   [employer] of Occupational Health - Occupational Stress Questionnaire    Feeling of Stress : Not at all  Social Connections: Socially Integrated (06/29/2021)   Social Connection and Isolation Panel [NHANES]    Frequency of Communication with Friends and Family: More than three times a week    Frequency of Social Gatherings with Friends and Family: Once a week    Attends Religious Services: More than 4 times per year    Active Member of [employer] or Organizations: Yes    Attends Engineer, Structural: More than 4 times per year    Marital  Status: Married    Allergies:  Allergies  Allergen Reactions   Ciprofloxacin Hcl Other (See Comments)    Silent seizures.   Amoxicillin     dizziness   Clindamycin/Lincomycin Nausea And Vomiting    Intercrainial hypertention   Doxycycline     Headache, Dizziness, swelling of eyelids   Minocycline Swelling   Coppertone Kids Spf15 [Albolene]  Hives and Swelling   Vicodin [Hydrocodone-Acetaminophen ] Other (See Comments)    Makes her feel drunk and she prefers not to take this.    Metabolic Disorder Labs: No results found for: HGBA1C, MPG No results found for: PROLACTIN Lab Results  Component Value Date   CHOL 148 12/26/2016   TRIG 30 12/26/2016   HDL 50 (L) 12/26/2016   CHOLHDL 3.0 12/26/2016   VLDL 6 12/26/2016   LDLCALC 92 12/26/2016   Lab Results  Component Value Date   TSH 1.320 07/02/2021    Therapeutic Level Labs: No results found for: LITHIUM No results found for: VALPROATE No results found for: CBMZ  Current Medications: Current Outpatient Medications  Medication Sig Dispense Refill   [START ON 12/02/2023] ADDERALL  XR 30 MG 24 hr capsule Take 1 capsule (30 mg total) by mouth every morning. 30 capsule 0   [START ON 01/01/2024] ADDERALL  XR 30 MG 24 hr capsule Take 1 capsule (30 mg total) by mouth every morning. 30 capsule 0   No current facility-administered medications for this visit.     Musculoskeletal: Strength & Muscle Tone:  N/A Gait & Station:  N/A Patient leans: N/A  Psychiatric Specialty Exam: Review of Systems  Last menstrual period 08/31/2016.There is no height or weight on file to calculate BMI.  General Appearance: Well Groomed  Eye Contact:  Good  Speech:  Clear and Coherent  Volume:  Normal  Mood:   good  Affect:  Appropriate, Congruent, and Full Range  Thought Process:  Coherent  Orientation:  Full (Time, Place, and Person)  Thought Content: Logical   Suicidal Thoughts:  No  Homicidal Thoughts:  No  Memory:  Immediate;    Good  Judgement:  Good  Insight:  Good  Psychomotor Activity:  Normal  Concentration:  Concentration: Good and Attention Span: Good  Recall:  Good  Fund of Knowledge: Good  Language: Good  Akathisia:  No  Handed:  Right  AIMS (if indicated): not done  Assets:  Communication Skills Desire for Improvement  ADL's:  Intact  Cognition: WNL  Sleep:  Good   Screenings: GAD-7    Flowsheet Row Office Visit from 06/29/2021 in [facility]  Total GAD-7 Score 3      PHQ2-9    Flowsheet Row Office Visit from 03/07/2022 in [facility] Office Visit from 06/29/2021 in [facility] Video Visit from 03/05/2021 in [facility]  PHQ-2 Total Score 0 0 0  PHQ-9 Total Score -- 4 --      Flowsheet Row Office Visit from 03/07/2022 in [facility]  C-SSRS RISK CATEGORY No Risk        Assessment and Plan:  NYLENE INLOW is a 38 y.o. year old female with a history of ADHD, who presents for follow up appointment for below.   1. Attention deficit hyperactivity disorder (ADHD), predominantly inattentive type -  (diagnosed with neuropsych test 05/2017), UDS positive for amphetamine  only 01/2023     She continues to have good benefit from Adderall .  Will continue current dose to target ADHD.   # systemic fungal infection per report - she is seen at [facility]  She was diagnosed with the above along with low progesterone level, and other low vitamin levels.  She is now started on inositol, having completed fluconazole.  She reports subjective improvement  in variety of symptoms since this intervention.  Will continue to assess as needed.    Plan  Continue Adderall  XR 30 mg daily (name brand only)  Next appointment: 3/26 at 1:20, video - she will monitor her BP through  [facility]   Past trials: Vyvanse  (fatigue)   The patient demonstrates the following risk factors for suicide: Chronic risk factors for suicide include: N/A. Acute risk factors for suicide include: N/A. Protective factors for this patient include: positive social support, responsibility to others (children, family), coping skills and hope for the future. Considering these factors, the overall suicide risk at this point appears to be low. Patient is appropriate for outpatient follow up.      Collaboration of Care: Collaboration of Care: Other reviewed notes in Epic  Patient/Guardian was advised Release of Information must be obtained prior to any record release in order to collaborate their care with an outside provider. Patient/Guardian was advised if they have not already done so to contact the registration department to sign all necessary forms in order for us  to release information regarding their care.   Consent: Patient/Guardian gives verbal consent for treatment and assignment of benefits for services provided during this visit. Patient/Guardian expressed understanding and agreed to proceed.   I have utilized the [place] Controlled Substances Reporting System (PMP AWARxE) to confirm adherence regarding the patient's medication. My review reveals appropriate prescription fills.    Katheren Sleet, MD 11/07/2023, 11:33 AM

## 2023-11-08 ENCOUNTER — Telehealth: Payer: 59 | Admitting: Psychiatry

## 2024-01-21 NOTE — Progress Notes (Unsigned)
 Virtual Visit via Video Note  I connected with Alexis White on 01/24/24 at  1:20 PM EDT by a video enabled telemedicine application and verified that I am speaking with the correct person using two identifiers.  Location: Patient: home Provider: office Persons participated in the visit- patient, provider    I discussed the limitations of evaluation and management by telemedicine and the availability of in person appointments. The patient expressed understanding and agreed to proceed.    I discussed the assessment and treatment plan with the patient. The patient was provided an opportunity to ask questions and all were answered. The patient agreed with the plan and demonstrated an understanding of the instructions.   The patient was advised to call back or seek an in-person evaluation if the symptoms worsen or if the condition fails to improve as anticipated.   [name], MD    [facility] MD/PA/NP OP Progress Note  01/24/2024 1:47 PM Alexis White  MRN:  [id number]  Chief Complaint:  Chief Complaint  Patient presents with   Follow-up   HPI:  This is a follow-up appointment for ADHD.  She states that she has been doing well.  They have decided not to do softball due to concern about the atmosphere, such as social hierarchy.  Her daughters are into sewing and others.  It has been a struggle to take time for herself.  She has noticed that her focus is not optimal, although decent.  She was recently started on progesterone.  She also tried testosterone but could not tolerate it.  She has limited appetite, which has been chronic for many years.  She also tries not to eat as much as she feels less productive after eating.  She denies feeling depressed.  She denies insomnia.  She denies SI.  She denies alcohol use or drug use.  She feels comfortable to stay on the current medication regimen for now.   Visit Diagnosis:    ICD-10-CM   1. Attention deficit hyperactivity disorder (ADHD),  predominantly inattentive type  F90.0       Past Psychiatric History: Please see initial evaluation for full details. I have reviewed the history. No updates at this time.     Past Medical History:  Past Medical History:  Diagnosis Date   Absence seizure (HCC)    Acute kidney failure following labor and delivery    Acute liver failure    Allergy    Anemia    Asthma    Complication of anesthesia    Depression    HELLP syndrome in first trimester 2009   HELLP syndrome in first trimester    Kidney infection    PONV (postoperative nausea and vomiting)    Seizures (HCC)     Past Surgical History:  Procedure Laterality Date   ABDOMINAL HYSTERECTOMY N/A 08/31/2016   Procedure: ABDOMINAL HYSTERECTOMY WITH REMOVAL OF CERVIX;  Surgeon: Lazaro Arms, MD;  Location: [facility];  Service: Gynecology;  Laterality: N/A;   BREAST ENHANCEMENT SURGERY     CESAREAN SECTION     SCAR REVISION  08/31/2016   Procedure: REVISION OF ABDOMINAL WALL SCAR;  Surgeon: Lazaro Arms, MD;  Location: [facility];  Service: Gynecology;;   TUBAL LIGATION      Family Psychiatric History: Please see initial evaluation for full details. I have reviewed the history. No updates at this time.     Family History:  Family History  Adopted: Yes  Problem Relation Age of Onset  Cancer Paternal Grandfather        bladder   Heart disease Paternal Grandmother    Heart disease Maternal Grandmother    Congestive Heart Failure Maternal Grandmother    Heart disease Father 33       "code blue"   Alcohol abuse Father    Diabetes Father    Nevi Mother    Cancer Maternal Aunt        skin   Breast cancer Maternal Aunt    Heart disease Maternal Aunt        CHF   Breast cancer Maternal Aunt    Cancer Maternal Aunt        skin   Diabetes Other    Heart disease Other     Social History:  Social History   Socioeconomic History   Marital status: Married    Spouse name: Shaun   Number of children: 3   Years of  education: 13   Highest education level: Not on file  Occupational History   Occupation: massage therapy    Comment: by schooling   Occupation: Bake art- cakes    Comment: self  Tobacco Use   Smoking status: Former    Types: E-cigarettes    Quit date: 11/01/2011    Years since quitting: 12.2   Smokeless tobacco: Never   Tobacco comments:    02-06-2017 PER PT [facility].  Vaping Use   Vaping status: Every Day  Substance and Sexual Activity   Alcohol use: No   Drug use: No   Sexual activity: Yes    Birth control/protection: Surgical    Comment: hysterectomy  Other Topics Concern   Not on file  Social History Narrative   Lives at home   Husband Shaun   Three daughters   Tries to exercise   Social Drivers of Health   Financial Resource Strain: Not on file  Food Insecurity: No Food Insecurity (06/29/2021)   Hunger Vital Sign    Worried About Running Out of Food in the Last Year: Never true    Ran Out of Food in the Last Year: Never true  Transportation Needs: No Transportation Needs (06/29/2021)   PRAPARE - Administrator, Civil Service (Medical): No    Lack of Transportation (Non-Medical): No  Physical Activity: Insufficiently Active (06/29/2021)   Exercise Vital Sign    Days of Exercise per Week: 2 days    Minutes of Exercise per Session: 40 min  Stress: No Stress Concern Present (06/29/2021)   [employer] of Occupational Health - Occupational Stress Questionnaire    Feeling of Stress : Not at all  Social Connections: Socially Integrated (06/29/2021)   Social Connection and Isolation Panel [NHANES]    Frequency of Communication with Friends and Family: More than three times a week    Frequency of Social Gatherings with Friends and Family: Once a week    Attends Religious Services: More than 4 times per year    Active Member of [employer] or Organizations: Yes    Attends Engineer, structural: More than 4 times per year    Marital Status: Married     Allergies:  Allergies  Allergen Reactions   Ciprofloxacin Hcl Other (See Comments)    Silent seizures.   Amoxicillin     dizziness   Clindamycin/Lincomycin Nausea And Vomiting    Intercrainial hypertention   Doxycycline     Headache, Dizziness, swelling of eyelids   Minocycline Swelling   Coppertone Kids Spf15 [Albolene]  Hives and Swelling   Vicodin [Hydrocodone-Acetaminophen] Other (See Comments)    Makes her feel drunk and she prefers not to take this.    Metabolic Disorder Labs: No results found for: "HGBA1C", "MPG" No results found for: "PROLACTIN" Lab Results  Component Value Date   CHOL 148 12/26/2016   TRIG 30 12/26/2016   HDL 50 (L) 12/26/2016   CHOLHDL 3.0 12/26/2016   VLDL 6 12/26/2016   LDLCALC 92 12/26/2016   Lab Results  Component Value Date   TSH 1.320 07/02/2021    Therapeutic Level Labs: No results found for: "LITHIUM" No results found for: "VALPROATE" No results found for: "CBMZ"  Current Medications: Current Outpatient Medications  Medication Sig Dispense Refill   [START ON 04/03/2024] amphetamine-dextroamphetamine (ADDERALL XR) 30 MG 24 hr capsule Take 1 capsule (30 mg total) by mouth daily. 30 capsule 0   [START ON 02/03/2024] ADDERALL XR 30 MG 24 hr capsule Take 1 capsule (30 mg total) by mouth every morning. 30 capsule 0   [START ON 03/04/2024] ADDERALL XR 30 MG 24 hr capsule Take 1 capsule (30 mg total) by mouth every morning. 30 capsule 0   No current facility-administered medications for this visit.     Musculoskeletal: Strength & Muscle Tone:  N/A Gait & Station:  N/A Patient leans: N/A  Psychiatric Specialty Exam: Review of Systems  Psychiatric/Behavioral:  Positive for decreased concentration. Negative for agitation, behavioral problems, confusion, dysphoric mood, hallucinations, self-injury, sleep disturbance and suicidal ideas. The patient is not nervous/anxious and is not hyperactive.   All other systems reviewed and are  negative.   Last menstrual period 08/31/2016.There is no height or weight on file to calculate BMI.  General Appearance: Well Groomed  Eye Contact:  Good  Speech:  Clear and Coherent  Volume:  Normal  Mood:   good  Affect:  Appropriate, Congruent, and Full Range  Thought Process:  Coherent  Orientation:  Full (Time, Place, and Person)  Thought Content: Logical   Suicidal Thoughts:  No  Homicidal Thoughts:  No  Memory:  Immediate;   Good  Judgement:  Good  Insight:  Good  Psychomotor Activity:  Normal  Concentration:  Concentration: Good and Attention Span: Good  Recall:  Good  Fund of Knowledge: Good  Language: Good  Akathisia:  No  Handed:  Right  AIMS (if indicated): not done  Assets:  Communication Skills Desire for Improvement  ADL's:  Intact  Cognition: WNL  Sleep:  Good   Screenings: GAD-7    Flowsheet Row Office Visit from 06/29/2021 in [facility]  Total GAD-7 Score 3      PHQ2-9    Flowsheet Row Office Visit from 03/07/2022 in [facility] Office Visit from 06/29/2021 in [facility] Video Visit from 03/05/2021 in [facility]  PHQ-2 Total Score 0 0 0  PHQ-9 Total Score -- 4 --      Flowsheet Row Office Visit from 03/07/2022 in [facility]  C-SSRS RISK CATEGORY No Risk        Assessment and Plan:  Alexis White is a 38 y.o. year old female with a history of ADHD, who presents for follow up appointment for below.    1. Attention deficit hyperactivity disorder (ADHD), predominantly inattentive type -  (diagnosed with neuropsych test 05/2017), UDS positive for amphetamine only 01/2023  She reports slight worsening in inattention, which coincided with progesterone for intervention as below.  She agrees to stay on the current dose of Adderall for now  given she previously had a good benefit from this medication.    # systemic fungal infection per report - she is seen at [facility] She was diagnosed with the above along with low progesterone level, and other low vitamin levels.  She underwent treatment with inositol, fluconazole, and currently on progesterone. Will continue to assess as needed.    Plan  Continue Adderall XR 30 mg daily (name brand only)  Next appointment: 6/18 at 1 20, video - she will monitor her BP through [facility]   Past trials: Vyvanse (fatigue)   The patient demonstrates the following risk factors for suicide: Chronic risk factors for suicide include: N/A. Acute risk factors for suicide include: N/A. Protective factors for this patient include: positive social support, responsibility to others (children, family), coping skills and hope for the future. Considering these factors, the overall suicide risk at this point appears to be low. Patient is appropriate for outpatient follow up.        Collaboration of Care: Collaboration of Care: Other reviewed notes in Epic  Patient/Guardian was advised Release of Information must be obtained prior to any record release in order to collaborate their care with an outside provider. Patient/Guardian was advised if they have not already done so to contact the registration department to sign all necessary forms in order for [place] to release information regarding their care.   Consent: Patient/Guardian gives verbal consent for treatment and assignment of benefits for services provided during this visit. Patient/Guardian expressed understanding and agreed to proceed.    [name], MD 01/24/2024, 1:47 PM

## 2024-01-24 ENCOUNTER — Telehealth (INDEPENDENT_AMBULATORY_CARE_PROVIDER_SITE_OTHER): Payer: 59 | Admitting: Psychiatry

## 2024-01-24 ENCOUNTER — Encounter: Payer: Self-pay | Admitting: Psychiatry

## 2024-01-24 DIAGNOSIS — F900 Attention-deficit hyperactivity disorder, predominantly inattentive type: Secondary | ICD-10-CM | POA: Diagnosis not present

## 2024-01-24 MED ORDER — ADDERALL XR 30 MG PO CP24: 30.0000 mg | ORAL_CAPSULE | ORAL | 0 refills | Status: DC

## 2024-01-24 MED ORDER — AMPHETAMINE-DEXTROAMPHET ER 30 MG PO CP24
30.0000 mg | ORAL_CAPSULE | Freq: Every day | ORAL | 0 refills | Status: DC
Start: 2024-04-03 — End: 2024-05-17

## 2024-01-24 NOTE — Patient Instructions (Signed)
 Continue Adderall XR 30 mg daily  Next appointment: 6/18 at 1 20

## 2024-04-14 NOTE — Progress Notes (Signed)
 Virtual Visit via Video Note  I connected with Alexis White on 04/17/24 at  1:20 PM EDT by a video enabled telemedicine application and verified that I am speaking with the correct person using two identifiers.  Location: Patient: home Provider: home office Persons participated in the visit- patient, provider    I discussed the limitations of evaluation and management by telemedicine and the availability of in person appointments. The patient expressed understanding and agreed to proceed.    I discussed the assessment and treatment plan with the patient. The patient was provided an opportunity to ask questions and all were answered. The patient agreed with the plan and demonstrated an understanding of the instructions.   The patient was advised to call back or seek an in-person evaluation if the symptoms worsen or if the condition fails to improve as anticipated.   Todd Fossa, MD   [facility] MD/PA/NP OP Progress Note  04/17/2024 1:49 PM Alexis White  MRN:  [id number]  Chief Complaint:  Chief Complaint  Patient presents with   Follow-up   HPI:  This is a follow-up appointment for ADHD.  She states that things has been decent.  However, she feels her medication is not working as sit use to.  She tends to lose train of thoughts and has difficulty in attention.  She has difficulty inducing household tasks.  When people are trying to talk to her, she has difficulty in comprehension.  Her husband and kids have noticed this.  She thinks she has been doing good otherwise.  She has completed treatment for Candida, and treatment was progesterone.  Although there is changing sleep schedule, she sleeps 7 hours consistently.  Her energy has been good except that she was sick.  Although she does not have a good appetite even prior to being on stimulant, she has been able to eat consistently.  She denies feeling depressed or anxiety.  She denies SI, HI, hallucinations.  She drinks 3 cups of weak  coffee.  She denies alcohol use or drug use.  She agrees with the plans as outlined below.    Visit Diagnosis:    ICD-10-CM   1. Attention deficit hyperactivity disorder (ADHD), predominantly inattentive type  F90.0       Past Psychiatric History: Please see initial evaluation for full details. I have reviewed the history. No updates at this time.     Past Medical History:  Past Medical History:  Diagnosis Date   Absence seizure (HCC)    Acute kidney failure following labor and delivery    Acute liver failure    Allergy    Anemia    Asthma    Complication of anesthesia    Depression    HELLP syndrome in first trimester 2009   HELLP syndrome in first trimester    Kidney infection    PONV (postoperative nausea and vomiting)    Seizures (HCC)     Past Surgical History:  Procedure Laterality Date   ABDOMINAL HYSTERECTOMY N/A 08/31/2016   Procedure: ABDOMINAL HYSTERECTOMY WITH REMOVAL OF CERVIX;  Surgeon: Wendelyn Halter, MD;  Location: [facility];  Service: Gynecology;  Laterality: N/A;   BREAST ENHANCEMENT SURGERY     CESAREAN SECTION     SCAR REVISION  08/31/2016   Procedure: REVISION OF ABDOMINAL WALL SCAR;  Surgeon: Wendelyn Halter, MD;  Location: [facility];  Service: Gynecology;;   TUBAL LIGATION      Family Psychiatric History: Please see initial evaluation for full details.  I have reviewed the history. No updates at this time.     Family History:  Family History  Adopted: Yes  Problem Relation Age of Onset   Cancer Paternal Grandfather        bladder   Heart disease Paternal Grandmother    Heart disease Maternal Grandmother    Congestive Heart Failure Maternal Grandmother    Heart disease Father 7       code blue   Alcohol abuse Father    Diabetes Father    [place] Mother    Cancer Maternal Aunt        skin   Breast cancer Maternal Aunt    Heart disease Maternal Aunt        CHF   Breast cancer Maternal Aunt    Cancer Maternal Aunt        skin   Diabetes Other     Heart disease Other     Social History:  Social History   Socioeconomic History   Marital status: Married    Spouse name: Shaun   Number of children: 3   Years of education: 13   Highest education level: Not on file  Occupational History   Occupation: massage therapy    Comment: by schooling   Occupation: Bake art- cakes    Comment: self  Tobacco Use   Smoking status: Former    Types: E-cigarettes    Quit date: 11/01/2011    Years since quitting: 12.4   Smokeless tobacco: Never   Tobacco comments:    02-06-2017 PER PT [facility].  Vaping Use   Vaping status: Every Day  Substance and Sexual Activity   Alcohol use: No   Drug use: No   Sexual activity: Yes    Birth control/protection: Surgical    Comment: hysterectomy  Other Topics Concern   Not on file  Social History Narrative   Lives at home   Husband Shaun   Three daughters   Tries to exercise   Social Drivers of Health   Financial Resource Strain: Not on file  Food Insecurity: No Food Insecurity (06/29/2021)   Hunger Vital Sign    Worried About Running Out of Food in the Last Year: Never true    Ran Out of Food in the Last Year: Never true  Transportation Needs: No Transportation Needs (06/29/2021)   PRAPARE - Administrator, Civil Service (Medical): No    Lack of Transportation (Non-Medical): No  Physical Activity: Insufficiently Active (06/29/2021)   Exercise Vital Sign    Days of Exercise per Week: 2 days    Minutes of Exercise per Session: 40 min  Stress: No Stress Concern Present (06/29/2021)   [employer] of Occupational Health - Occupational Stress Questionnaire    Feeling of Stress : Not at all  Social Connections: Socially Integrated (06/29/2021)   Social Connection and Isolation Panel    Frequency of Communication with Friends and Family: More than three times a week    Frequency of Social Gatherings with Friends and Family: Once a week    Attends Religious Services: More than 4 times  per year    Active Member of [employer] or Organizations: Yes    Attends Engineer, structural: More than 4 times per year    Marital Status: Married    Allergies:  Allergies  Allergen Reactions   Ciprofloxacin Hcl Other (See Comments)    Silent seizures.   Amoxicillin     dizziness   Clindamycin/Lincomycin Nausea  And Vomiting    Intercrainial hypertention   Doxycycline     Headache, Dizziness, swelling of eyelids   Minocycline Swelling   Coppertone Kids Spf15 [Albolene] Hives and Swelling   Vicodin [Hydrocodone-Acetaminophen ] Other (See Comments)    Makes her feel drunk and she prefers not to take this.    Metabolic Disorder Labs: No results found for: HGBA1C, MPG No results found for: PROLACTIN Lab Results  Component Value Date   CHOL 148 12/26/2016   TRIG 30 12/26/2016   HDL 50 (L) 12/26/2016   CHOLHDL 3.0 12/26/2016   VLDL 6 12/26/2016   LDLCALC 92 12/26/2016   Lab Results  Component Value Date   TSH 1.320 07/02/2021    Therapeutic Level Labs: No results found for: LITHIUM No results found for: VALPROATE No results found for: CBMZ  Current Medications: Current Outpatient Medications  Medication Sig Dispense Refill   amphetamine -dextroamphetamine  (ADDERALL  XR) 5 MG 24 hr capsule Take 1 capsule (5 mg total) by mouth daily. Total of 35 mg daily. Take along with 30 mg cap 30 capsule 0   ADDERALL  XR 30 MG 24 hr capsule Take 1 capsule (30 mg total) by mouth every morning. 30 capsule 0   [START ON 05/08/2024] ADDERALL  XR 30 MG 24 hr capsule Take 1 capsule (30 mg total) by mouth every morning. 30 capsule 0   amphetamine -dextroamphetamine  (ADDERALL  XR) 30 MG 24 hr capsule Take 1 capsule (30 mg total) by mouth daily. 30 capsule 0   No current facility-administered medications for this visit.     Musculoskeletal: Strength & Muscle Tone: N/A Gait & Station: N/A Patient leans: N/A  Psychiatric Specialty Exam: Review of Systems   Psychiatric/Behavioral:  Positive for decreased concentration. Negative for agitation, behavioral problems, confusion, dysphoric mood, hallucinations, self-injury, sleep disturbance and suicidal ideas. The patient is not nervous/anxious and is not hyperactive.   All other systems reviewed and are negative.   Last menstrual period 08/31/2016.There is no height or weight on file to calculate BMI.  General Appearance: Well Groomed  Eye Contact:  Good  Speech:  Clear and Coherent  Volume:  Normal  Mood:  good  Affect:  Appropriate, Congruent, and Full Range  Thought Process:  Coherent  Orientation:  Full (Time, Place, and Person)  Thought Content: Logical   Suicidal Thoughts:  No  Homicidal Thoughts:  No  Memory:  Immediate;   Good  Judgement:  Good  Insight:  Good  Psychomotor Activity:  Normal  Concentration:  Concentration: Good and Attention Span: Good  Recall:  Good  Fund of Knowledge: Good  Language: Good  Akathisia:  No  Handed:  Right  AIMS (if indicated): not done  Assets:  Communication Skills Desire for Improvement  ADL's:  Intact  Cognition: WNL  Sleep:  Good   Screenings: GAD-7    Flowsheet Row Office Visit from 06/29/2021 in [facility]  Total GAD-7 Score 3   PHQ2-9    Flowsheet Row Office Visit from 03/07/2022 in [facility] Office Visit from 06/29/2021 in [facility] Video Visit from 03/05/2021 in [facility]  PHQ-2 Total Score 0 0 0  PHQ-9 Total Score -- 4 --   Flowsheet Row Office Visit from 03/07/2022 in [facility]  C-SSRS RISK CATEGORY No Risk     Assessment and Plan:  Alexis White is a 38 y.o.  year old female with a history of ADHD, who presents for follow up appointment for below.    1. Attention deficit hyperactivity disorder (ADHD),  predominantly inattentive type -  (diagnosed with neuropsych test 05/2017), UDS positive for amphetamine  only 01/2023       There has been slight worsening in completing tasks, attention over the past few months despite stabilization in her physical condition.  Will uptitrate also XR optimize treatment for ADHD.  Discussed potential risk of worsening in anxiety, headache, palpitation, hypertension, and insomnia.  Her vital sign has been within normal range.  We will plan to have in person visit in the future to monitor blood pressure.   dose of Adderall  for now given she previously had a good benefit from this medication.    # systemic fungal infection per report - she is seen at [facility]  She was diagnosed with the above along with low progesterone level, and other low vitamin levels.  She underwent treatment with inositol, fluconazole, and currently on progesterone. Will continue to assess as needed.    Plan  Increase Adderall  XR 35 mg daily (name brand only)  Next appointment: 7/18 at 8 20, video - she will monitor her BP through [facility]     Past trials: Vyvanse  (fatigue)   The patient demonstrates the following risk factors for suicide: Chronic risk factors for suicide include: N/A. Acute risk factors for suicide include: N/A. Protective factors for this patient include: positive social support, responsibility to others (children, family), coping skills and hope for the future. Considering these factors, the overall suicide risk at this point appears to be low. Patient is appropriate for outpatient follow up.        Collaboration of Care: Collaboration of Care: Other reviewed notes in Epic  Patient/Guardian was advised Release of Information must be obtained prior to any record release in order to collaborate their care with an outside provider. Patient/Guardian was advised if they have not already done so to contact the  registration department to sign all necessary forms in order for us  to release information regarding their care.   Consent: Patient/Guardian gives verbal consent for treatment and assignment of benefits for services provided during this visit. Patient/Guardian expressed understanding and agreed to proceed.    Todd Fossa, MD 04/17/2024, 1:49 PM

## 2024-04-17 ENCOUNTER — Telehealth (INDEPENDENT_AMBULATORY_CARE_PROVIDER_SITE_OTHER): Admitting: Psychiatry

## 2024-04-17 ENCOUNTER — Encounter: Payer: Self-pay | Admitting: Psychiatry

## 2024-04-17 DIAGNOSIS — F900 Attention-deficit hyperactivity disorder, predominantly inattentive type: Secondary | ICD-10-CM

## 2024-04-17 MED ORDER — AMPHETAMINE-DEXTROAMPHET ER 5 MG PO CP24: 5.0000 mg | ORAL_CAPSULE | Freq: Every day | ORAL | 0 refills | Status: DC

## 2024-04-17 MED ORDER — ADDERALL XR 30 MG PO CP24: 30.0000 mg | ORAL_CAPSULE | ORAL | 0 refills | Status: DC

## 2024-04-17 NOTE — Patient Instructions (Signed)
 Increase Adderall  XR 35 mg daily  Next appointment: 7/18 at 8 20

## 2024-05-07 ENCOUNTER — Other Ambulatory Visit: Payer: Self-pay | Admitting: Psychiatry

## 2024-05-07 NOTE — Telephone Encounter (Signed)
 Could you please contact the pharmacy? There should be a medication that was ordered to be filled. Please update the patient once it's confirmed.

## 2024-05-09 NOTE — Telephone Encounter (Signed)
 Spoke to patient she was able to pick up her Adderall  XR 30 mg capsule

## 2024-05-12 NOTE — Progress Notes (Addendum)
 Virtual Visit via Video Note  I connected with Alexis White on 05/20/24 at  8:20 AM EDT by a video enabled telemedicine application and verified that I am speaking with the correct person using two identifiers.  Location: Patient: home Provider: home office Persons participated in the visit- patient, provider    I discussed the limitations of evaluation and management by telemedicine and the availability of in person appointments. The patient expressed understanding and agreed to proceed.   Katheren Sleet, MD    [facility] MD/PA/NP OP Progress Note  05/17/2024 8:38 AM Alexis White  MRN:  [id number]  Chief Complaint:  Chief Complaint  Patient presents with   Follow-up   HPI:  This is a follow-up appointment for ADHD.  She states that she is currently at the hotel for a vacation.  Although she was sick, and still has some lingering cough, it has been improving.  She feels she has been right back to how she used to be doing since uptitration of Adderall .  5 mg made a significant difference.  She denies any issues with focus, or distraction.  She sleeps well.  Her appetite has been the same.  She denies SI, HI, hallucinations.  Her joint pain subsided significantly since she was treated with antifungals.  Although she could not tolerate testosterone cream, she is planning to discuss with her provider.  She feels comfortable to stay on the current medication.   Visit Diagnosis:    ICD-10-CM   1. Attention deficit hyperactivity disorder (ADHD), predominantly inattentive type  F90.0       Past Psychiatric History: Please see initial evaluation for full details. I have reviewed the history. No updates at this time.    Past Medical History:  Past Medical History:  Diagnosis Date   Absence seizure (HCC)    Acute kidney failure following labor and delivery    Acute liver failure    Allergy    Anemia    Asthma    Complication of anesthesia    Depression    HELLP syndrome in first  trimester 2009   HELLP syndrome in first trimester    Kidney infection    PONV (postoperative nausea and vomiting)    Seizures (HCC)     Past Surgical History:  Procedure Laterality Date   ABDOMINAL HYSTERECTOMY N/A 08/31/2016   Procedure: ABDOMINAL HYSTERECTOMY WITH REMOVAL OF CERVIX;  Surgeon: Vonn VEAR Inch, MD;  Location: [facility];  Service: Gynecology;  Laterality: N/A;   BREAST ENHANCEMENT SURGERY     CESAREAN SECTION     SCAR REVISION  08/31/2016   Procedure: REVISION OF ABDOMINAL WALL SCAR;  Surgeon: Vonn VEAR Inch, MD;  Location: [facility];  Service: Gynecology;;   TUBAL LIGATION      Family Psychiatric History: Please see initial evaluation for full details. I have reviewed the history. No updates at this time.     Family History:  Family History  Adopted: Yes  Problem Relation Age of Onset   Cancer Paternal Grandfather        bladder   Heart disease Paternal Grandmother    Heart disease Maternal Grandmother    Congestive Heart Failure Maternal Grandmother    Heart disease Father 22       code blue   Alcohol abuse Father    Diabetes Father    Nevi Mother    Cancer Maternal Aunt        skin   Breast cancer Maternal Aunt  Heart disease Maternal Aunt        CHF   Breast cancer Maternal Aunt    Cancer Maternal Aunt        skin   Diabetes Other    Heart disease Other     Social History:  Social History   Socioeconomic History   Marital status: Married    Spouse name: Shaun   Number of children: 3   Years of education: 13   Highest education level: Not on file  Occupational History   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  Vaping Use   Vaping status: Every Day  Substance and Sexual Activity   Alcohol  use: No   Drug use: No   Sexual activity: Yes    Birth control/protection: Surgical    Comment: hysterectomy  Other Topics Concern   Not on file  Social History Narrative   Lives at home   Husband Shaun   Three daughters   Tries to exercise   Social Drivers of Health   Financial Resource Strain: Not on file  Food Insecurity: No Food Insecurity (06/29/2021)   Hunger Vital Sign    Worried About Running Out of Food in the Last Year: Never true    Ran Out of Food in the Last Year: Never true  Transportation Needs: No Transportation Needs (06/29/2021)   PRAPARE - Administrator, Civil Service (Medical): No    Lack of Transportation (Non-Medical): No  Physical Activity: Insufficiently Active (06/29/2021)   Exercise Vital Sign    Days of Exercise per Week: 2 days    Minutes of Exercise per Session: 40 min  Stress: No Stress Concern Present (06/29/2021)   [employer] of Occupational Health - Occupational Stress Questionnaire    Feeling of Stress : Not at all  Social Connections: Socially Integrated (06/29/2021)   Social Connection and Isolation Panel    Frequency of Communication with Friends and Family: More than three times a week    Frequency of Social Gatherings with Friends and Family: Once a week    Attends Religious Services: More than 4 times per year    Active Member of [employer] or Organizations: Yes    Attends Engineer, structural: More than 4 times per year    Marital Status: Married    Allergies:  Allergies  Allergen Reactions   Ciprofloxacin Hcl Other (See Comments)    Silent seizures.   Amoxicillin     dizziness   Clindamycin/Lincomycin Nausea And Vomiting    Intercrainial hypertention   Doxycycline     Headache, Dizziness, swelling of eyelids   Minocycline Swelling   Coppertone Kids Spf15 [Albolene] Hives and Swelling   Vicodin [Hydrocodone-Acetaminophen ] Other (See Comments)    Makes her feel drunk and she prefers not to take this.     Metabolic Disorder Labs: No results found for: HGBA1C, MPG No results found for: PROLACTIN Lab Results  Component Value Date   CHOL 148 12/26/2016   TRIG 30 12/26/2016   HDL 50 (L) 12/26/2016   CHOLHDL 3.0 12/26/2016   VLDL 6 12/26/2016   LDLCALC 92 12/26/2016   Lab Results  Component Value Date  TSH 1.320 07/02/2021    Therapeutic Level Labs: No results found for: LITHIUM No results found for: VALPROATE No results found for: CBMZ  Current Medications: Current Outpatient Medications  Medication Sig Dispense Refill   amphetamine -dextroamphetamine  (ADDERALL  XR) 5 MG 24 hr capsule Take 1 capsule (5 mg total) by mouth every morning. Total of 35 mg daily. Take along with 30 mg cap 30 capsule 0   [START ON 06/16/2024] amphetamine -dextroamphetamine  (ADDERALL  XR) 5 MG 24 hr capsule Take 1 capsule (5 mg total) by mouth every morning. Total of 35 mg daily. Take along with 30 mg cap 30 capsule 0   [START ON 07/16/2024] amphetamine -dextroamphetamine  (ADDERALL  XR) 5 MG 24 hr capsule Take 1 capsule (5 mg total) by mouth every morning. Total of 35 mg daily. Take along with 30 mg cap 30 capsule 0   [START ON 06/07/2024] ADDERALL  XR 30 MG 24 hr capsule Take 1 capsule (30 mg total) by mouth every morning. 30 capsule 0   [START ON 07/07/2024] ADDERALL  XR 30 MG 24 hr capsule Take 1 capsule (30 mg total) by mouth every morning. 30 capsule 0   [START ON 08/06/2024] amphetamine -dextroamphetamine  (ADDERALL  XR) 30 MG 24 hr capsule Take 1 capsule (30 mg total) by mouth daily. 30 capsule 0   amphetamine -dextroamphetamine  (ADDERALL  XR) 5 MG 24 hr capsule Take 1 capsule (5 mg total) by mouth daily. Total of 35 mg daily. Take along with 30 mg cap 30 capsule 0   No current facility-administered medications for this visit.     Musculoskeletal: Strength & Muscle Tone: N/A Gait & Station: N/A Patient leans: N/A  Psychiatric Specialty Exam: Review of Systems  Psychiatric/Behavioral:  Negative  for agitation, behavioral problems, confusion, decreased concentration, dysphoric mood, hallucinations, self-injury, sleep disturbance and suicidal ideas. The patient is not nervous/anxious and is not hyperactive.   All other systems reviewed and are negative.   Last menstrual period 08/31/2016.There is no height or weight on file to calculate BMI.  General Appearance: Well Groomed  Eye Contact:  Good  Speech:  Clear and Coherent  Volume:  Normal  Mood:  good  Affect:  Appropriate, Congruent, and Full Range  Thought Process:  Coherent  Orientation:  Full (Time, Place, and Person)  Thought Content: Logical   Suicidal Thoughts:  No  Homicidal Thoughts:  No  Memory:  Immediate;   Good  Judgement:  Good  Insight:  Good  Psychomotor Activity:  Normal  Concentration:  Concentration: Good and Attention Span: Good  Recall:  Good  Fund of Knowledge: Good  Language: Good  Akathisia:  No  Handed:  Right  AIMS (if indicated): not done  Assets:  Communication Skills Desire for Improvement  ADL's:  Intact  Cognition: WNL  Sleep:  Good   Screenings: GAD-7    Flowsheet Row Office Visit from 06/29/2021 in [facility] for [facility]  Total GAD-7 Score 3   PHQ2-9    Flowsheet Row Office Visit from 03/07/2022 in [facility] Office Visit from 06/29/2021 in [facility] for [facility] at [facility] Video Visit from 03/05/2021 in [facility]  PHQ-2 Total Score 0 0 0  PHQ-9 Total Score -- 4 --   Flowsheet Row Office Visit from 03/07/2022 in [facility]  C-SSRS RISK CATEGORY No Risk     Assessment and Plan:  Alexis White is a 38 y.o. year old female with a  history of ADHD, who presents for follow up appointment for below.   1. Attention deficit hyperactivity disorder (ADHD), predominantly inattentive type -  (diagnosed with  neuropsych test 05/2017), UDS positive for amphetamine  only 01/2023       There has been significant improvement since uptitration of Adderall  XR without any side effect.  Will continue current dose to target ADHD.    # systemic fungal infection per report - she is seen at [facility]  She was diagnosed with the above along with low progesterone level, and other low vitamin levels.  She underwent treatment with inositol, fluconazole, and currently on progesterone cream. Will continue to assess as needed.    Plan  Increase Adderall  XR 35 mg daily (name brand only)  Next appointment: 10/15 at 1 20, video - on progesterone cream, d/c testosterone cream - inositol for PCOS, fish oil, folic acid, super greens, kale for vitamin K, macro loop - she will monitor her BP through [facility]    Past trials: Vyvanse  (fatigue)   The patient demonstrates the following risk factors for suicide: Chronic risk factors for suicide include: N/A. Acute risk factors for suicide include: N/A. Protective factors for this patient include: positive social support, responsibility to others (children, family), coping skills and hope for the future. Considering these factors, the overall suicide risk at this point appears to be low. Patient is appropriate for outpatient follow up.        Collaboration of Care: Collaboration of Care: Other reviewed notes in Epic  Patient/Guardian was advised Release of Information must be obtained prior to any record release in order to collaborate their care with an outside provider. Patient/Guardian was advised if they have not already done so to contact the registration department to sign all necessary forms in order for us  to release information regarding their care.   Consent: Patient/Guardian gives verbal consent for treatment and assignment of benefits for services provided during this visit. Patient/Guardian expressed  understanding and agreed to proceed.    Katheren Sleet, MD 05/17/2024, 8:38 AM

## 2024-05-17 ENCOUNTER — Encounter: Payer: Self-pay | Admitting: Psychiatry

## 2024-05-17 ENCOUNTER — Telehealth (INDEPENDENT_AMBULATORY_CARE_PROVIDER_SITE_OTHER): Admitting: Psychiatry

## 2024-05-17 DIAGNOSIS — F900 Attention-deficit hyperactivity disorder, predominantly inattentive type: Secondary | ICD-10-CM | POA: Diagnosis not present

## 2024-05-17 MED ORDER — AMPHETAMINE-DEXTROAMPHET ER 5 MG PO CP24
5.0000 mg | ORAL_CAPSULE | ORAL | 0 refills | Status: DC
Start: 2024-05-17 — End: 2024-08-14

## 2024-05-17 MED ORDER — AMPHETAMINE-DEXTROAMPHET ER 5 MG PO CP24: 5.0000 mg | ORAL_CAPSULE | ORAL | 0 refills | Status: DC

## 2024-05-17 MED ORDER — ADDERALL XR 30 MG PO CP24: 30.0000 mg | ORAL_CAPSULE | ORAL | 0 refills | Status: DC

## 2024-05-17 MED ORDER — AMPHETAMINE-DEXTROAMPHET ER 30 MG PO CP24
30.0000 mg | ORAL_CAPSULE | Freq: Every day | ORAL | 0 refills | Status: DC
Start: 2024-08-06 — End: 2024-08-14

## 2024-05-17 MED ORDER — AMPHETAMINE-DEXTROAMPHET ER 5 MG PO CP24
5.0000 mg | ORAL_CAPSULE | ORAL | 0 refills | Status: DC
Start: 2024-06-16 — End: 2024-08-14

## 2024-08-09 NOTE — Progress Notes (Signed)
 Virtual Visit via Video Note  I connected with Alexis White on 08/14/24 at  1:20 PM EDT by a video enabled telemedicine application and verified that I am speaking with the correct person using two identifiers.  Location: Patient: home Provider: home office Persons participated in the visit- patient, provider    I discussed the limitations of evaluation and management by telemedicine and the availability of in person appointments. The patient expressed understanding and agreed to proceed.   I discussed the assessment and treatment plan with the patient. The patient was provided an opportunity to ask questions and all were answered. The patient agreed with the plan and demonstrated an understanding of the instructions.   The patient was advised to call back or seek an in-person evaluation if the symptoms worsen or if the condition fails to improve as anticipated.    Katheren Sleet, MD    [facility] MD/PA/NP OP Progress Note  08/14/2024 1:48 PM Alexis White  MRN:  [id number]  Chief Complaint:  Chief Complaint  Patient presents with   Follow-up   HPI:  This is a follow-up appointment for ADHD.  She states that she has been doing well.  She is preparing for sweet 16, and celebration for her oldest daughter.  They have been trying to figure out her youngest daughters treatment for sleep apnea.  She states that she is considering to go to school to become notary, mainly for income.  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  She states that she has been doing a lot better since taking higher dose of Adderall  XR.  She denies any noticeable side effect.  She sleeps well.  Although her appetite remains minimal, she tries not to eat too much at night.  She denies feeling depressed or anxiety.  She denies SI, hallucinations.  She denies alcohol use or substance use.  She agrees with the plans as outlined below.   Visit Diagnosis:    ICD-10-CM   1.  Attention deficit hyperactivity disorder (ADHD), predominantly inattentive type  F90.0       Past Psychiatric History: Please see initial evaluation for full details. I have reviewed the history. No updates at this time.     Past Medical History:  Past Medical History:  Diagnosis Date   Absence seizure (HCC)    Acute kidney failure following labor and delivery    Acute liver failure    Allergy    Anemia    Asthma    Complication of anesthesia    Depression    HELLP syndrome in first trimester 2009   HELLP syndrome in first trimester    Kidney infection    PONV (postoperative nausea and vomiting)    Seizures (HCC)     Past Surgical History:  Procedure Laterality Date   ABDOMINAL HYSTERECTOMY N/A 08/31/2016   Procedure: ABDOMINAL HYSTERECTOMY WITH REMOVAL OF CERVIX;  Surgeon: Vonn VEAR Inch, MD;  Location: [facility];  Service: Gynecology;  Laterality: N/A;   BREAST ENHANCEMENT SURGERY     CESAREAN SECTION     SCAR REVISION  08/31/2016   Procedure: REVISION OF ABDOMINAL WALL SCAR;  Surgeon: Vonn VEAR Inch, MD;  Location: [facility];  Service: Gynecology;;   TUBAL LIGATION      Family Psychiatric History: Please see initial evaluation for full details. I have reviewed the history. No updates at this time.     Family History:  Family History  Adopted: Yes  Problem  Relation Age of Onset   Cancer Paternal Grandfather        bladder   Heart disease Paternal Grandmother    Heart disease Maternal Grandmother    Congestive Heart Failure Maternal Grandmother    Heart disease Father 33       code blue   Alcohol abuse Father    Diabetes Father    Nevi Mother    Cancer Maternal Aunt        skin   Breast cancer Maternal Aunt    Heart disease Maternal Aunt        CHF   Breast cancer Maternal Aunt    Cancer Maternal Aunt        skin   Diabetes Other    Heart disease Other     Social History:  Social History   Socioeconomic History   Marital status: Married    Spouse name:  Shaun   Number of children: 3   Years of education: 13   Highest education level: Not on file  Occupational History   Occupation: massage therapy    Comment: by schooling   Occupation: Bake art- cakes    Comment: self  Tobacco Use   Smoking status: Former    Types: E-cigarettes    Quit date: 11/01/2011    Years since quitting: 12.7   Smokeless tobacco: Never   Tobacco comments:    02-06-2017 PER PT [facility].  Vaping Use   Vaping status: Every Day  Substance and Sexual Activity   Alcohol use: No   Drug use: No   Sexual activity: Yes    Birth control/protection: Surgical    Comment: hysterectomy  Other Topics Concern   Not on file  Social History Narrative   Lives at home   Husband Shaun   Three daughters   Tries to exercise   Social Drivers of Health   Financial Resource Strain: Not on file  Food Insecurity: No Food Insecurity (06/29/2021)   Hunger Vital Sign    Worried About Running Out of Food in the Last Year: Never true    Ran Out of Food in the Last Year: Never true  Transportation Needs: No Transportation Needs (06/29/2021)   PRAPARE - Administrator, Civil Service (Medical): No    Lack of Transportation (Non-Medical): No  Physical Activity: Insufficiently Active (06/29/2021)   Exercise Vital Sign    Days of Exercise per Week: 2 days    Minutes of Exercise per Session: 40 min  Stress: No Stress Concern Present (06/29/2021)   [employer] of Occupational Health - Occupational Stress Questionnaire    Feeling of Stress : Not at all  Social Connections: Socially Integrated (06/29/2021)   Social Connection and Isolation Panel    Frequency of Communication with Friends and Family: More than three times a week    Frequency of Social Gatherings with Friends and Family: Once a week    Attends Religious Services: More than 4 times per year    Active Member of [employer] or Organizations: Yes    Attends Engineer, structural: More than 4 times per year     Marital Status: Married    Allergies:  Allergies  Allergen Reactions   Ciprofloxacin Hcl Other (See Comments)    Silent seizures.   Amoxicillin     dizziness   Clindamycin/Lincomycin Nausea And Vomiting    Intercrainial hypertention   Doxycycline     Headache, Dizziness, swelling of eyelids   Minocycline Swelling  Coppertone Kids Spf15 [Albolene] Hives and Swelling   Vicodin [Hydrocodone-Acetaminophen ] Other (See Comments)    Makes her feel drunk and she prefers not to take this.    Metabolic Disorder Labs: No results found for: HGBA1C, MPG No results found for: PROLACTIN Lab Results  Component Value Date   CHOL 148 12/26/2016   TRIG 30 12/26/2016   HDL 50 (L) 12/26/2016   CHOLHDL 3.0 12/26/2016   VLDL 6 12/26/2016   LDLCALC 92 12/26/2016   Lab Results  Component Value Date   TSH 1.320 07/02/2021    Therapeutic Level Labs: No results found for: LITHIUM No results found for: VALPROATE No results found for: CBMZ  Current Medications: Current Outpatient Medications  Medication Sig Dispense Refill   amphetamine -dextroamphetamine  (ADDERALL  XR) 30 MG 24 hr capsule Take 1 capsule (30 mg total) by mouth every morning. 30 capsule 0   [START ON 09/13/2024] amphetamine -dextroamphetamine  (ADDERALL  XR) 30 MG 24 hr capsule Take 1 capsule (30 mg total) by mouth every morning. 30 capsule 0   [START ON 10/13/2024] amphetamine -dextroamphetamine  (ADDERALL  XR) 30 MG 24 hr capsule Take 1 capsule (30 mg total) by mouth every morning. 30 capsule 0   amphetamine -dextroamphetamine  (ADDERALL  XR) 5 MG 24 hr capsule Take 1 capsule (5 mg total) by mouth every morning. Total of 35 mg daily. Take along with 30 mg cap 30 capsule 0   [START ON 08/28/2024] amphetamine -dextroamphetamine  (ADDERALL  XR) 5 MG 24 hr capsule Take 1 capsule (5 mg total) by mouth daily. Total of 35 mg daily. Take along with 30 mg cap 30 capsule 0   [START ON 09/27/2024] amphetamine -dextroamphetamine  (ADDERALL  XR)  5 MG 24 hr capsule Take 1 capsule (5 mg total) by mouth every morning. Total of 35 mg daily. Take along with 30 mg cap 30 capsule 0   [START ON 10/27/2024] amphetamine -dextroamphetamine  (ADDERALL  XR) 5 MG 24 hr capsule Take 1 capsule (5 mg total) by mouth every morning. Total of 35 mg daily. Take along with 30 mg cap 30 capsule 0   No current facility-administered medications for this visit.     Musculoskeletal: Strength & Muscle Tone: N/A Gait & Station: N/A Patient leans: N/A  Psychiatric Specialty Exam: Review of Systems  Psychiatric/Behavioral: Negative.    All other systems reviewed and are negative.   Last menstrual period 08/31/2016.There is no height or weight on file to calculate BMI.  General Appearance: Well Groomed  Eye Contact:  Good  Speech:  Clear and Coherent  Volume:  Normal  Mood:  good  Affect:  Appropriate, Congruent, and Full Range  Thought Process:  Coherent  Orientation:  Full (Time, Place, and Person)  Thought Content: Logical   Suicidal Thoughts:  No  Homicidal Thoughts:  No  Memory:  Immediate;   Good  Judgement:  Good  Insight:  Good  Psychomotor Activity:  Normal  Concentration:  Concentration: Good and Attention Span: Good  Recall:  Good  Fund of Knowledge: Good  Language: Good  Akathisia:  No  Handed:  Right  AIMS (if indicated): not done  Assets:  Communication Skills Desire for Improvement  ADL's:  Intact  Cognition: WNL  Sleep:  Good   Screenings: GAD-7    Flowsheet Row Office Visit from 06/29/2021 in [facility] for [facility]  Total GAD-7 Score 3   PHQ2-9    Flowsheet Row Office Visit from 03/07/2022 in [facility] Office Visit from 06/29/2021 in [facility] for [facility]  Healthcare at [facility] Video Visit from 03/05/2021 in [facility]  PHQ-2 Total Score 0 0 0  PHQ-9 Total Score -- 4 --   Flowsheet Row Office  Visit from 03/07/2022 in [facility]  C-SSRS RISK CATEGORY No Risk     Assessment and Plan:  Alexis White is a 38 y.o. year old female with a history of ADHD, who presents for follow up appointment for below.   1. Attention deficit hyperactivity disorder (ADHD), predominantly inattentive type -  (diagnosed with neuropsych test 05/2017), UDS positive for amphetamine  only 01/2023        She reports significant benefit from the higher dose of Adderall  XR.  Will maintain on the current dose to target ADHD.   # systemic fungal infection per report - she is seen at [facility]  She was diagnosed with the above along with low progesterone level, and other low vitamin levels.  She underwent treatment with inositol, fluconazole, and currently on progesterone cream. Will continue to assess as needed.    Plan  Continue Adderall  XR 35 mg daily (name brand only)  Next appointment: 1/7 at 1 40, video - on progesterone cream, d/c testosterone cream - inositol for PCOS, fish oil, folic acid, super greens, kale for vitamin K, macro loop - she will [facility] her BP through [facility]     Past trials: Vyvanse  (fatigue)   The patient demonstrates the following risk factors for suicide: Chronic risk factors for suicide include: N/A. Acute risk factors for suicide include: N/A. Protective factors for this patient include: positive social support, responsibility to others (children, family), coping skills and hope for the future. Considering these factors, the overall suicide risk at this point appears to be low. Patient is appropriate for outpatient follow up.      Collaboration of Care: Collaboration of Care: Other reviewed notes in Epic  Patient/Guardian was advised Release of Information must be obtained prior to any record release in order to collaborate their care with an outside provider.  Patient/Guardian was advised if they have not already done so to contact the registration department to sign all necessary forms in order for us  to release information regarding their care.   Consent: Patient/Guardian gives verbal consent for treatment and assignment of benefits for services provided during this visit. Patient/Guardian expressed understanding and agreed to proceed.    Katheren Sleet, MD 08/14/2024, 1:48 PM

## 2024-08-14 ENCOUNTER — Encounter: Payer: Self-pay | Admitting: Psychiatry

## 2024-08-14 ENCOUNTER — Telehealth: Admitting: Psychiatry

## 2024-08-14 DIAGNOSIS — F900 Attention-deficit hyperactivity disorder, predominantly inattentive type: Secondary | ICD-10-CM

## 2024-08-14 MED ORDER — AMPHETAMINE-DEXTROAMPHET ER 5 MG PO CP24: 5.0000 mg | ORAL_CAPSULE | ORAL | 0 refills | Status: DC

## 2024-08-14 MED ORDER — AMPHETAMINE-DEXTROAMPHET ER 30 MG PO CP24: 30.0000 mg | ORAL_CAPSULE | ORAL | 0 refills | Status: DC

## 2024-08-14 MED ORDER — AMPHETAMINE-DEXTROAMPHET ER 5 MG PO CP24: 5.0000 mg | ORAL_CAPSULE | Freq: Every day | ORAL | 0 refills | Status: DC

## 2024-11-01 ENCOUNTER — Other Ambulatory Visit: Payer: Self-pay | Admitting: Psychiatry

## 2024-11-02 NOTE — Progress Notes (Unsigned)
 Virtual Visit via Video Note  I connected with Alexis White on 11/06/2024 at  1:40 PM EST by a video enabled telemedicine application and verified that I am speaking with the correct person using two identifiers.  Location: Patient: home Provider: home office Persons participated in the visit- patient, provider    I discussed the limitations of evaluation and management by telemedicine and the availability of in person appointments. The patient expressed understanding and agreed to proceed.   I discussed the assessment and treatment plan with the patient. The patient was provided an opportunity to ask questions and all were answered. The patient agreed with the plan and demonstrated an understanding of the instructions.   The patient was advised to call back or seek an in-person evaluation if the symptoms worsen or if the condition fails to improve as anticipated.   Katheren Sleet, MD    [facility] MD/PA/NP OP Progress Note  11/06/2024 2:05 PM Alexis White  MRN:  [id number]  Chief Complaint:  Chief Complaint  Patient presents with   Follow-up   HPI:  This is a follow-up appointment for ADHD. She states that she is doing well.  She was busy during Christmas.  Her mother lives nearby and she continues to communicate with her in short increments.  Her oldest daughter started driving.  Younger woman goes to early college.  She is working to be a Dance Movement Psychotherapist.  She is under stress of this but has decided to proceed with this to help for her children to get the car.  She states that her focus has been good.  She sleeps 7 hours.  She denies change in appetite except that she was eating junk food during holidays.  She denies feeling depressed or anxiety.  She denies SI, hallucinations.  She feels comfortable to stay on the current medication regimen   Visit Diagnosis:    ICD-10-CM   1. Attention deficit hyperactivity disorder (ADHD), predominantly inattentive type  F90.0       Past Psychiatric  History: Please see initial evaluation for full details. I have reviewed the history. No updates at this time.     Past Medical History:  Past Medical History:  Diagnosis Date   Absence seizure (HCC)    Acute kidney failure following labor and delivery    Acute liver failure    Allergy    Anemia    Asthma    Complication of anesthesia    Depression    HELLP syndrome in first trimester 2009   HELLP syndrome in first trimester    Kidney infection    PONV (postoperative nausea and vomiting)    Seizures (HCC)     Past Surgical History:  Procedure Laterality Date   ABDOMINAL HYSTERECTOMY N/A 08/31/2016   Procedure: ABDOMINAL HYSTERECTOMY WITH REMOVAL OF CERVIX;  Surgeon: Vonn VEAR Inch, MD;  Location: [facility];  Service: Gynecology;  Laterality: N/A;   BREAST ENHANCEMENT SURGERY     CESAREAN SECTION     SCAR REVISION  08/31/2016   Procedure: REVISION OF ABDOMINAL WALL SCAR;  Surgeon: Vonn VEAR Inch, MD;  Location: [facility];  Service: Gynecology;;   TUBAL LIGATION      Family Psychiatric History: Please see initial evaluation for full details. I have reviewed the history. No updates at this time.     Family History:  Family History  Adopted: Yes  Problem Relation Age of Onset   Cancer Paternal Grandfather        bladder  Heart disease Paternal Grandmother    Heart disease Maternal Grandmother    Congestive Heart Failure Maternal Grandmother    Heart disease Father 17       code blue   Alcohol abuse Father    Diabetes Father    Nevi Mother    Cancer Maternal Aunt        skin   Breast cancer Maternal Aunt    Heart disease Maternal Aunt        CHF   Breast cancer Maternal Aunt    Cancer Maternal Aunt        skin   Diabetes Other    Heart disease Other     Social History:  Social History   Socioeconomic History   Marital status: Married    Spouse name: Shaun   Number of children: 3   Years of education: 13   Highest education level: Not on file  Occupational  History   Occupation: massage therapy    Comment: by schooling   Occupation: Bake art- cakes    Comment: self  Tobacco Use   Smoking status: Former    Types: E-cigarettes    Quit date: 11/01/2011    Years since quitting: 13.0   Smokeless tobacco: Never   Tobacco comments:    02-06-2017 PER PT [facility].  Vaping Use   Vaping status: Every Day  Substance and Sexual Activity   Alcohol use: No   Drug use: No   Sexual activity: Yes    Birth control/protection: Surgical    Comment: hysterectomy  Other Topics Concern   Not on file  Social History Narrative   Lives at home   Husband Shaun   Three daughters   Tries to exercise   Social Drivers of Health   Tobacco Use: Medium Risk (08/14/2024)   Patient History    Smoking Tobacco Use: Former    Smokeless Tobacco Use: Never    Passive Exposure: Not on Actuary Strain: Not on file  Food Insecurity: Not on file  Transportation Needs: Not on file  Physical Activity: Not on file  Stress: Not on file  Social Connections: Not on file  Depression (PHQ2-9): Low Risk (03/07/2022)   Depression (PHQ2-9)    PHQ-2 Score: 0  Alcohol Screen: Not on file  Housing: Not on file  Utilities: Not on file  Health Literacy: Not on file    Allergies: Allergies[1]  Metabolic Disorder Labs: No results found for: HGBA1C, MPG No results found for: PROLACTIN Lab Results  Component Value Date   CHOL 148 12/26/2016   TRIG 30 12/26/2016   HDL 50 (L) 12/26/2016   CHOLHDL 3.0 12/26/2016   VLDL 6 12/26/2016   LDLCALC 92 12/26/2016   Lab Results  Component Value Date   TSH 1.320 07/02/2021    Therapeutic Level Labs: No results found for: LITHIUM No results found for: VALPROATE No results found for: CBMZ  Current Medications: Current Outpatient Medications  Medication Sig Dispense Refill   [START ON 11/20/2024] amphetamine -dextroamphetamine  (ADDERALL  XR) 30 MG 24 hr capsule Take 1 capsule (30 mg total) by mouth every  morning. 30 capsule 0   [START ON 12/20/2024] amphetamine -dextroamphetamine  (ADDERALL  XR) 30 MG 24 hr capsule Take 1 capsule (30 mg total) by mouth every morning. 30 capsule 0   [START ON 01/19/2025] amphetamine -dextroamphetamine  (ADDERALL  XR) 30 MG 24 hr capsule Take 1 capsule (30 mg total) by mouth every morning. 30 capsule 0   amphetamine -dextroamphetamine  (ADDERALL  XR) 5 MG 24 hr  capsule Take 1 capsule (5 mg total) by mouth daily. Total of 35 mg daily, take along with 30 mg cap 30 capsule 0   [START ON 12/01/2024] amphetamine -dextroamphetamine  (ADDERALL  XR) 5 MG 24 hr capsule Take 1 capsule (5 mg total) by mouth every morning. Total of 35 mg daily. Take along with 30 mg cap 30 capsule 0   [START ON 12/31/2024] amphetamine -dextroamphetamine  (ADDERALL  XR) 5 MG 24 hr capsule Take 1 capsule (5 mg total) by mouth daily. Total of 35 mg daily. Take along with 30 mg cap 30 capsule 0   [START ON 01/30/2025] amphetamine -dextroamphetamine  (ADDERALL  XR) 5 MG 24 hr capsule Take 1 capsule (5 mg total) by mouth every morning. Total of 35 mg daily. Take along with 30 mg cap 30 capsule 0   No current facility-administered medications for this visit.     Musculoskeletal: Strength & Muscle Tone: N/A Gait & Station: N/A Patient leans: N/A  Psychiatric Specialty Exam: Review of Systems  Psychiatric/Behavioral: Negative.    All other systems reviewed and are negative.   Last menstrual period 08/31/2016.There is no height or weight on file to calculate BMI.  General Appearance: Well Groomed  Eye Contact:  Good  Speech:  Clear and Coherent  Volume:  Normal  Mood:  good  Affect:  Appropriate, Congruent, and Full Range  Thought Process:  Coherent  Orientation:  Full (Time, Place, and Person)  Thought Content: Logical   Suicidal Thoughts:  No  Homicidal Thoughts:  No  Memory:  Immediate;   Good  Judgement:  Good  Insight:  Good  Psychomotor Activity:  Normal  Concentration:  Concentration: Good and Attention  Span: Good  Recall:  Good  Fund of Knowledge: Good  Language: Good  Akathisia:  No  Handed:  Right  AIMS (if indicated): not done  Assets:  Communication Skills Desire for Improvement  ADL's:  Intact  Cognition: WNL  Sleep:  Good   Screenings: GAD-7    Flowsheet Row Office Visit from 06/29/2021 in [facility]  Total GAD-7 Score 3   PHQ2-9    Flowsheet Row Office Visit from 03/07/2022 in [facility] Office Visit from 06[facility]29/2021 in [facility] Video Visit from 03/05/2021 in [facility]  PHQ-2 Total Score 0 0 0  PHQ-9 Total Score -- 4 --   Flowsheet Row Office Visit from 03/07/2022 in [facility]  C-SSRS RISK CATEGORY No Risk     Assessment and Plan:  Alexis White is a 39 y.o. year old female with a history of ADHD, who presents for follow up appointment for below.     1. Attention deficit hyperactivity disorder (ADHD), predominantly inattentive type -  (diagnosed with neuropsych test 05/2017), UDS positive for amphetamine  only 01/2023        She reports significant benefit from the current dose of Adderall  XR.  Will continue the current dose to target ADHD.    # systemic fungal infection per report - she is seen at [facility]  She was diagnosed with the above along with low progesterone level, and other low vitamin levels.  She underwent treatment with inositol, fluconazole, and currently on progesterone cream.  She agrees to send the note to us  for review.    Plan  Continue Adderall  XR 35 mg daily (name brand only)  Next appointment: 4/8 at  1 pm, video - on progesterone cream, d/c testosterone cream - inositol for PCOS, fish oil, folic acid, super greens, kale for vitamin K, macro loop - she will monitor her BP through [facility]    Past trials: Vyvanse  (fatigue)   The patient demonstrates the following risk factors for suicide: Chronic risk factors for suicide include: N/A. Acute risk factors for suicide include: N/A. Protective factors for this patient include: positive social support, responsibility to others (children, family), coping skills and hope for the future. Considering these factors, the overall suicide risk at this point appears to be low. Patient is appropriate for outpatient follow up.      Collaboration of Care: Collaboration of Care: Other reviewed notes in Epic  Patient/Guardian was advised Release of Information must be obtained prior to any record release in order to collaborate their care with an outside provider. Patient/Guardian was advised if they have not already done so to contact the registration department to sign all necessary forms in order for us  to release information regarding their care.   Consent: Patient/Guardian gives verbal consent for treatment and assignment of benefits for services provided during this visit. Patient/Guardian expressed understanding and agreed to proceed.    Katheren Sleet, MD 11/06/2024, 2:05 PM     [1]  Allergies Allergen Reactions   Ciprofloxacin Hcl Other (See Comments)    Silent seizures.   Amoxicillin     dizziness   Clindamycin/Lincomycin Nausea And Vomiting    Intercrainial hypertention   Doxycycline     Headache, Dizziness, swelling of eyelids   Minocycline Swelling   Coppertone Kids Spf15 [Albolene] Hives and Swelling   Vicodin [Hydrocodone-Acetaminophen ] Other (See Comments)    Makes her feel drunk and she prefers not to take this.

## 2024-11-06 ENCOUNTER — Telehealth: Admitting: Psychiatry

## 2024-11-06 ENCOUNTER — Encounter: Payer: Self-pay | Admitting: Psychiatry

## 2024-11-06 DIAGNOSIS — F900 Attention-deficit hyperactivity disorder, predominantly inattentive type: Secondary | ICD-10-CM | POA: Diagnosis not present

## 2024-11-06 MED ORDER — AMPHETAMINE-DEXTROAMPHET ER 30 MG PO CP24: 30.0000 mg | ORAL_CAPSULE | ORAL | 0 refills | Status: AC

## 2024-11-06 MED ORDER — AMPHETAMINE-DEXTROAMPHET ER 5 MG PO CP24: 5.0000 mg | ORAL_CAPSULE | ORAL | 0 refills | Status: AC

## 2024-11-06 MED ORDER — AMPHETAMINE-DEXTROAMPHET ER 5 MG PO CP24: 5.0000 mg | ORAL_CAPSULE | Freq: Every day | ORAL | 0 refills | Status: AC

## 2024-11-06 NOTE — Patient Instructions (Signed)
 Continue Adderall  XR 35 mg daily  Next appointment: 4/8 at 1 pm

## 2025-02-05 ENCOUNTER — Telehealth: Admitting: Psychiatry
